# Patient Record
Sex: Female | Born: 1965 | Race: White | Hispanic: No | Marital: Married | Smoking: Never smoker
Health system: Southern US, Community
[De-identification: ages and names within clinical notes are randomized; demographics above are authoritative.]

## PROBLEM LIST (undated history)

## (undated) DIAGNOSIS — S92343A Displaced fracture of fourth metatarsal bone, unspecified foot, initial encounter for closed fracture: Secondary | ICD-10-CM

## (undated) DIAGNOSIS — C801 Malignant (primary) neoplasm, unspecified: Secondary | ICD-10-CM

## (undated) DIAGNOSIS — S92333A Displaced fracture of third metatarsal bone, unspecified foot, initial encounter for closed fracture: Secondary | ICD-10-CM

## (undated) DIAGNOSIS — S92351A Displaced fracture of fifth metatarsal bone, right foot, initial encounter for closed fracture: Secondary | ICD-10-CM

## (undated) DIAGNOSIS — N200 Calculus of kidney: Secondary | ICD-10-CM

## (undated) HISTORY — PX: COLONOSCOPY: SHX174

## (undated) HISTORY — DX: Calculus of kidney: N20.0

## (undated) HISTORY — PX: TONSILLECTOMY AND ADENOIDECTOMY: SUR1326

---

## 1992-11-20 DIAGNOSIS — N200 Calculus of kidney: Secondary | ICD-10-CM

## 1992-11-20 HISTORY — DX: Calculus of kidney: N20.0

## 1998-08-13 ENCOUNTER — Other Ambulatory Visit: Admission: RE | Admit: 1998-08-13 | Discharge: 1998-08-13 | Payer: Self-pay | Admitting: Obstetrics and Gynecology

## 2000-09-10 ENCOUNTER — Other Ambulatory Visit: Admission: RE | Admit: 2000-09-10 | Discharge: 2000-09-10 | Payer: Self-pay | Admitting: *Deleted

## 2000-10-08 ENCOUNTER — Other Ambulatory Visit: Admission: RE | Admit: 2000-10-08 | Discharge: 2000-10-08 | Payer: Self-pay | Admitting: *Deleted

## 2001-10-10 ENCOUNTER — Observation Stay (HOSPITAL_COMMUNITY): Admission: AD | Admit: 2001-10-10 | Discharge: 2001-10-11 | Payer: Self-pay | Admitting: *Deleted

## 2001-10-20 ENCOUNTER — Inpatient Hospital Stay (HOSPITAL_COMMUNITY): Admission: AD | Admit: 2001-10-20 | Discharge: 2001-10-22 | Payer: Self-pay | Admitting: Gynecology

## 2001-12-02 ENCOUNTER — Other Ambulatory Visit: Admission: RE | Admit: 2001-12-02 | Discharge: 2001-12-02 | Payer: Self-pay | Admitting: *Deleted

## 2003-10-09 ENCOUNTER — Other Ambulatory Visit: Admission: RE | Admit: 2003-10-09 | Discharge: 2003-10-09 | Payer: Self-pay | Admitting: Obstetrics and Gynecology

## 2003-11-30 ENCOUNTER — Encounter: Admission: RE | Admit: 2003-11-30 | Discharge: 2003-11-30 | Payer: Self-pay | Admitting: Obstetrics and Gynecology

## 2006-03-30 ENCOUNTER — Other Ambulatory Visit: Admission: RE | Admit: 2006-03-30 | Discharge: 2006-03-30 | Payer: Self-pay | Admitting: Obstetrics and Gynecology

## 2007-04-03 ENCOUNTER — Encounter: Admission: RE | Admit: 2007-04-03 | Discharge: 2007-04-03 | Payer: Self-pay | Admitting: Obstetrics and Gynecology

## 2007-04-09 ENCOUNTER — Encounter: Admission: RE | Admit: 2007-04-09 | Discharge: 2007-04-09 | Payer: Self-pay | Admitting: Obstetrics and Gynecology

## 2007-09-13 ENCOUNTER — Other Ambulatory Visit: Admission: RE | Admit: 2007-09-13 | Discharge: 2007-09-13 | Payer: Self-pay | Admitting: Obstetrics and Gynecology

## 2008-04-09 ENCOUNTER — Encounter: Admission: RE | Admit: 2008-04-09 | Discharge: 2008-04-09 | Payer: Self-pay | Admitting: Obstetrics and Gynecology

## 2009-04-12 ENCOUNTER — Encounter: Admission: RE | Admit: 2009-04-12 | Discharge: 2009-04-12 | Payer: Self-pay | Admitting: Obstetrics and Gynecology

## 2009-11-17 LAB — HM PAP SMEAR: HM Pap smear: NEGATIVE

## 2010-04-13 ENCOUNTER — Encounter: Admission: RE | Admit: 2010-04-13 | Discharge: 2010-04-13 | Payer: Self-pay | Admitting: Obstetrics and Gynecology

## 2011-02-07 ENCOUNTER — Other Ambulatory Visit: Payer: Self-pay | Admitting: Obstetrics and Gynecology

## 2011-02-07 DIAGNOSIS — Z1231 Encounter for screening mammogram for malignant neoplasm of breast: Secondary | ICD-10-CM

## 2011-04-07 NOTE — H&P (Signed)
[facility] of [facility]  Patient:    Patricia Hudson, Patricia Hudson Visit Number: [id number] MRN: [id number]          Service Type: OBS Location: 910B 9161 01 Attending Physician:  Tonye Royalty Dictated by:   Gaetano Hawthorne. Lily Peer, M.D. Admit Date:  10/20/2001                           History and Physical  CHIEF COMPLAINT:              Contractions.  HISTORY OF PRESENT ILLNESS:   The patient is a 45 year old, gravida 1, para 0, with an estimated date of confinement of October 15, 2001, currently 39-5/7 weeks into her pregnancy, who presented to [facility] this afternoon complaining of contractions that started early this morning and intensified at approximately 1300 hours today.  She denied any rupture of membrane.  The patient had been admitted to [facility] approximately ten days ago for induction secondary to favorable cervix, had Cervidil placed, but did not progress in her cervix past 1 cm, 30% effaced, and -3 station and was released home.  Now on todays examination, she was found to be 4+ cm, 90% effaced, -3 station, intact membrane, underwent artificial rupture of membrane with clear amniotic fluid, and she was having spontaneous contractions every four to six minutes apart with a reassuring fetal heart rate tracing.  Vital signs were stable and the patient was afebrile.  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  She has had tonsillectomy and adenoidectomy.  PRENATAL COURSE:              Her prenatal course has been essentially unremarkable, with the exception that since she was Rh negative, she received RhoGAM was [redacted] weeks gestation.  REVIEW OF SYSTEMS:            See hospital form.  PHYSICAL EXAMINATION:  VITAL SIGNS:                  Blood pressure 130/87, respirations 20, pulse 88.  In maternity admissions she was afebrile.  HEENT:                         Unremarkable.  NECK:                         Supple.  Trachea midline.  No carotid bruits. No thyromegaly.  LUNGS:                        Clear to auscultation, without rhonchi or wheezes.  HEART:                        Regular rate and rhythm, no murmurs or gallops.  BREASTS:                      Examination not done.  ABDOMEN:                      Gravid uterus, vertex presentation by Leopolds maneuver, positive felt heart tones.  PELVIC EXAM:                  Cervix 4+ cm, 90% effaced, -3 station.  Clear amniotic fluid  upon artificial rupture of membrane.  EXTREMITIES:                  DTRs 1+.  Negative clonus.  No edema.  PRENATAL LABORATORY DATA:     O negative blood type, negative antibody screen, VDRL was nonreactive, rubella immune, hepatitis B surface antigen and HIV were negative.  Pap smear was normal.  Alpha-fetoprotein was normal.  Blood sugar was normal.  Group B Strep culture was negative.  ASSESSMENT:                   45 year old, gravida 1, para 0, at 40-5/[redacted] weeks gestation, with advanced cervical dilatation and in labor.  The patients cervix was 4+ centimeters, 90% effaced, -3 station, and she underwent artificial rupture of membrane with clear amniotic fluid.  Fetal scalp electrode and IUPC were placed and reassuring fetal heart rate tracing. Contractions every five to six minutes apart.  Vital signs are stable and afebrile.  The patient will undergo epidural placement and we will augment with Pitocin per protocol.  Of note, GBS culture was negative.  PLAN:                         Anticipate vaginal delivery. Dictated by:   Gaetano Hawthorne Lily Peer, M.D. Attending Physician:  Tonye Royalty DD:  10/20/01 TD:  10/20/01 Job: [id number]

## 2011-04-07 NOTE — H&P (Signed)
[facility] of [facility]  Patient:    Patricia Hudson, Patricia Hudson Visit Number: [id number] MRN: [id number]          Service Type: Attending:  Katy Fitch, M.D. Dictated by:   Katy Fitch, M.D.                           History and Physical  CHIEF COMPLAINT:              Term intrauterine pregnancy.  HISTORY OF PRESENT ILLNESS:   The patient is a 45 year old, G1 at 73 1/2 weeks who will be admitted for elective induction secondary to favorable cervix on October 10, 2001. The patient has had no prenatal complications and all of her prenatal labs have all been within normal limits, including rubella, one-hour glucose, and negative GBS.  PAST MEDICAL HISTORY:         Negative.  PAST SURGICAL HISTORY:        Tonsils.  PAST GYNECOLOGIC HISTORY:     Without any abnormal Paps or STDs.  MEDICATIONS:                  Prenatal vitamins.  ALLERGIES:                    None.  SOCIAL HISTORY:               Without any tobacco, alcohol, or drugs.  FAMILY HISTORY:               Without any mental retardation or epithelial cancers.  PRENATAL LABORATORY DATA:     O negative, GBS negative, rubella immune.  PHYSICAL EXAMINATION:  VITAL SIGNS:                  Blood pressure 130/62.  HEENT:                        Throat clear.  LUNGS:                        Clear to auscultation bilaterally.  HEART:                        Regular rate and rhythm.  ABDOMEN:                      Gravid, nontender. Estimated fetal weight 7 pounds 2 ounces.  PELVIC:                       Cervix 1 1/2 to 2, 50% -2.  PLAN:                         The patient will be admitted for induction on November 21 with Cervidil and then high-dose Pitocin started in the a.m. The patient will have no penicillin for negative GBS. Dictated by:   Katy Fitch, M.D. Attending:  Katy Fitch, M.D. DD:  10/09/01 TD:  10/09/01 Job: [id number]

## 2011-04-07 NOTE — Discharge Summary (Signed)
[facility] of [facility]  Patient:    Patricia Hudson, Patricia Hudson Visit Number: [id number] MRN: [id number]          Service Type: OBS Location: 910A 9145 01 Attending Physician:  Tonye Royalty Dictated by:   Antony Contras, [facility] Admit Date:  10/20/2001 Discharge Date: 10/22/2001                             Discharge Summary  DISCHARGE DIAGNOSES:          1. Intrauterine pregnancy at term.                               2. Spontaneous onset of labor.  PROCEDURES:  Normal spontaneous vaginal delivery of viable infant over midline episiotomy with third degree extension.  HISTORY OF PRESENT ILLNESS:   The patient is a 45 year old primigravida with an LMP of January 08, 2001, [facility] October 15, 2001.  Prenatal course was uncomplicated.  LABORATORY/ACCESSORY DATA:    Blood type O-negative.  Antibody screen negative.  RPR, HBsAg, HIV nonreactive.  HOSPITAL COURSE/TREATMENT:    The patient was admitted on October 20, 2001 with spontaneous onset of labor.  She was 4 cm/100%/-3 station.  She underwent artificial rupture of membranes, clear amniotic fluid.  GBS was negative.  She progressed to complete dilatation and delivered an Apgar 8/8 female infant, weighed 7 pounds 12 ounces over a midline episiotomy with third degree extension.  POSTPARTUM COURSE:            She remained afebrile, had no difficulty voiding and was able to be discharged on her second postpartum day in satisfactory condition.  She did receive Rhogam since baby was O-positive.  CBC: Hematocrit 30, hemoglobin 10.8, WBC 19.7, platelets 164,000.  DISPOSITION:                  Follow up in six weeks.  Continue prenatal vitamins and iron.  Motrin, Tylox for pain. Dictated by:   Antony Contras, [facility] Attending Physician:  Tonye Royalty DD:  11/29/01 TD:  11/30/01 Job: [id number]

## 2011-04-18 ENCOUNTER — Ambulatory Visit
Admission: RE | Admit: 2011-04-18 | Discharge: 2011-04-18 | Disposition: A | Discharge status: Home / Self Care | Payer: PRIVATE HEALTH INSURANCE | Source: Ambulatory Visit | Attending: Obstetrics and Gynecology | Admitting: Obstetrics and Gynecology

## 2011-04-18 DIAGNOSIS — Z1231 Encounter for screening mammogram for malignant neoplasm of breast: Secondary | ICD-10-CM

## 2012-03-27 ENCOUNTER — Other Ambulatory Visit: Payer: Self-pay | Admitting: Obstetrics and Gynecology

## 2012-03-27 DIAGNOSIS — Z1231 Encounter for screening mammogram for malignant neoplasm of breast: Secondary | ICD-10-CM

## 2012-05-08 ENCOUNTER — Ambulatory Visit
Admission: RE | Admit: 2012-05-08 | Discharge: 2012-05-08 | Disposition: A | Discharge status: Home / Self Care | Payer: PRIVATE HEALTH INSURANCE | Source: Ambulatory Visit | Attending: Obstetrics and Gynecology | Admitting: Obstetrics and Gynecology

## 2012-05-08 DIAGNOSIS — Z1231 Encounter for screening mammogram for malignant neoplasm of breast: Secondary | ICD-10-CM

## 2012-05-08 LAB — HM MAMMOGRAPHY: HM Mammogram: NEGATIVE

## 2013-03-24 ENCOUNTER — Encounter: Payer: Self-pay | Admitting: *Deleted

## 2013-03-25 ENCOUNTER — Ambulatory Visit: Payer: Self-pay | Admitting: Obstetrics and Gynecology

## 2013-04-17 ENCOUNTER — Other Ambulatory Visit: Payer: Self-pay

## 2013-04-17 DIAGNOSIS — Z1231 Encounter for screening mammogram for malignant neoplasm of breast: Secondary | ICD-10-CM

## 2013-04-18 ENCOUNTER — Encounter: Payer: Self-pay | Admitting: Obstetrics and Gynecology

## 2013-04-18 ENCOUNTER — Ambulatory Visit (INDEPENDENT_AMBULATORY_CARE_PROVIDER_SITE_OTHER): Payer: BC Managed Care – PPO | Admitting: Obstetrics and Gynecology

## 2013-04-18 VITALS — BP 100/70 | Ht 67.5 in | Wt 140.0 lb

## 2013-04-18 DIAGNOSIS — N920 Excessive and frequent menstruation with regular cycle: Secondary | ICD-10-CM

## 2013-04-18 DIAGNOSIS — Z0000 Encounter for general adult medical examination without abnormal findings: Secondary | ICD-10-CM

## 2013-04-18 DIAGNOSIS — Z01419 Encounter for gynecological examination (general) (routine) without abnormal findings: Secondary | ICD-10-CM

## 2013-04-18 LAB — POCT URINALYSIS DIPSTICK
Glucose, UA: NEGATIVE
Nitrite, UA: NEGATIVE
Protein, UA: NEGATIVE
Urobilinogen, UA: NEGATIVE
pH, UA: 5

## 2013-04-18 NOTE — Patient Instructions (Signed)

## 2013-04-18 NOTE — Progress Notes (Signed)
Patient ID: Patricia Hudson, female   DOB: [birth year], 47 y.o.   MRN: [id number] 47 y.o. G1P1001 MarriedCaucasianF here for annual exam. Menses irregular, and when they come they are long, like a month long easily, and fairly heavy.  Ongoing for over a year.  This is year 58 of her Paragard.     Patient's last menstrual period was 03/04/2013.          Sexually active: yes  The current method of family planning is IUD.    Exercising: no  The patient does not participate in regular exercise at present. Smoker:  no  Health Maintenance: Pap:  11/18/09 History of abnormal Pap:  no MMG:  05/08/12 Colonoscopy:  never BMD:   never TDaP:  2008 Screening Labs: years ago, Hb today: 13.5, Urine today: trace blood   reports that she has never smoked. She has never used smokeless tobacco. She reports that  drinks alcohol. She reports that she does not use illicit drugs.  History reviewed. No pertinent past medical history.  Past Surgical History  Procedure Laterality Date  . Tonsillectomy and adenoidectomy  1970's    Current Outpatient Prescriptions  Medication Sig Dispense Refill  . PARAGARD INTRAUTERINE COPPER IU by Intrauterine route once.      . Acetaminophen (TYLENOL PO) Take by mouth as needed.      . IBUPROFEN PO Take by mouth as needed (chewable).       No current facility-administered medications for this visit.    Family History  Problem Relation Age of Onset  . Diabetes Father   . Hypertension Father   . Lung cancer Father   . Breast cancer Maternal Grandmother 60  . Breast cancer Mother 75  . Breast cancer Paternal Aunt 70    ROS:  Pertinent items are noted in HPI.  Otherwise, a comprehensive ROS was negative.  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  One son,Greyson age 61.  Husband Viviann Spare is a family practice physician here in [place].  Pt's father dx'd with small lung ca this year, tx'd with RT.    Exam:   BP 100/70  Ht 5' 7.5" (1.715  m)  Wt 140 lb (63.504 kg)  BMI 21.59 kg/m2  LMP 03/04/2013  Height: 5' 7.5" (171.5 cm) Ht stable, weight down 1 pound.  Ht Readings from Last 3 Encounters:  04/18/13 5' 7.5" (1.715 m)    General appearance: alert, cooperative and appears stated age Head: Normocephalic, without obvious abnormality, atraumatic Neck: no adenopathy, supple, symmetrical, trachea midline and thyroid normal to inspection and palpation Lungs: clear to auscultation bilaterally Breasts: normal appearance, no masses or tenderness Heart: regular rate and rhythm Abdomen: soft, non-tender; bowel sounds normal; no masses,  no organomegaly Extremities: extremities normal, atraumatic, no cyanosis or edema Skin: Skin color, texture, turgor normal. No rashes or lesions Lymph nodes: Cervical, supraclavicular, and axillary nodes normal. No abnormal inguinal nodes palpated Neurologic: Grossly normal   Pelvic: External genitalia:  no lesions              Urethra:  normal appearing urethra with no masses, tenderness or lesions              Bartholins and Skenes: normal                 Vagina: normal appearing vagina with normal color and discharge, no lesions              Cervix: no  lesions              Pap taken: yes Bimanual Exam:  Uterus:  normal size, contour, position, consistency, mobility, non-tender              Adnexa: no mass, fullness, tenderness               Rectovaginal: Confirms               Anus:  normal sphincter tone, no lesions  A:  Well Woman with normal exam, Paraguard due out 01/12/2014             Menorrhagia for about a year  P:   mammogram pap smear counseled on breast self exam, mammography screening, adequate intake of calcium and vitamin D, diet and exercise return annually or prn PUS/SHSG/poss bx  An After Visit Summary was printed and given to the patient.

## 2013-05-05 ENCOUNTER — Telehealth: Payer: Self-pay | Admitting: *Deleted

## 2013-05-05 NOTE — Telephone Encounter (Signed)
Call to patient to sched PUS/SHGM/poss endo biopsy.  Patient with IUD in place. Instructed to take Motrin 800 mg 1 hour prior to procedure with food. ( with IUD in place, anything else I need to do?)

## 2013-05-05 NOTE — Telephone Encounter (Signed)
No.  Nothing special.

## 2013-05-07 NOTE — Telephone Encounter (Signed)
LMTCB to discuss insurance benefits for pus-shg/endo bx. Patient already scheduled for 05/28/13

## 2013-05-15 NOTE — Telephone Encounter (Signed)
LMTCB to discuss insurance benefits for upcoming appointment.

## 2013-05-19 ENCOUNTER — Telehealth: Payer: Self-pay | Admitting: Obstetrics and Gynecology

## 2013-05-19 NOTE — Telephone Encounter (Signed)
LVM advising patient to keep her shgm/endo bx appointment per Dr. Tresa Res.

## 2013-05-22 NOTE — Telephone Encounter (Signed)
Patient returned call to me. I spoke with the patient about her upcoming pus-shgm/poss endo bx. I informed her that Dr. Tresa Res wants her to have it completed since this issue has been ongoing for over a year and they need to determine the cause of the excessive bleeding.The patient decided she wanted to cancel her appointment and will call back to reschedule if she experiences any other problems.  Routed to [place].

## 2013-05-27 ENCOUNTER — Ambulatory Visit
Admission: RE | Admit: 2013-05-27 | Discharge: 2013-05-27 | Disposition: A | Discharge status: Home / Self Care | Payer: BC Managed Care – PPO | Source: Ambulatory Visit

## 2013-05-27 DIAGNOSIS — Z1231 Encounter for screening mammogram for malignant neoplasm of breast: Secondary | ICD-10-CM

## 2013-05-28 ENCOUNTER — Other Ambulatory Visit: Payer: Self-pay | Admitting: Obstetrics and Gynecology

## 2013-05-28 ENCOUNTER — Other Ambulatory Visit: Payer: Self-pay

## 2013-06-25 NOTE — Telephone Encounter (Signed)
See letter from Dr Tresa Res.

## 2013-07-08 ENCOUNTER — Telehealth: Payer: Self-pay | Admitting: *Deleted

## 2013-07-08 NOTE — Telephone Encounter (Signed)
No further follow up per Dr Tresa Res. If patient should call, need to address any needs with a physician.

## 2013-07-08 NOTE — Telephone Encounter (Signed)
Message copied by Alisa Graff on Tue Jul 08, 2013 12:27 PM ------      Message from: Meredeth Ide P      Created: Tue Jul 08, 2013 11:22 AM      Regarding: RE: canceled PUS       No, no further f/u needed.  I'm not going to discharge her from the practice because her mother is a long time personal friend of mine.        ----- Message -----         From: Armen Pickup, RN         Sent: 07/07/2013   6:14 PM           To: Nolon Bussing, MD      Subject: canceled PUS                                              On 06-02-13, you mailed this patient a letter urging her to have her PUS.  It does not indicate discharge after 30 days.  She has not called.  Any further follow up?       ------

## 2014-07-07 ENCOUNTER — Other Ambulatory Visit: Payer: Self-pay

## 2014-07-07 DIAGNOSIS — Z1231 Encounter for screening mammogram for malignant neoplasm of breast: Secondary | ICD-10-CM

## 2014-07-16 ENCOUNTER — Ambulatory Visit: Payer: BC Managed Care – PPO

## 2014-07-28 ENCOUNTER — Ambulatory Visit
Admission: RE | Admit: 2014-07-28 | Discharge: 2014-07-28 | Disposition: A | Discharge status: Home / Self Care | Payer: BC Managed Care – PPO | Source: Ambulatory Visit

## 2014-07-28 DIAGNOSIS — Z1231 Encounter for screening mammogram for malignant neoplasm of breast: Secondary | ICD-10-CM

## 2014-09-17 ENCOUNTER — Encounter: Payer: Self-pay | Admitting: Nurse Practitioner

## 2014-09-17 ENCOUNTER — Ambulatory Visit (INDEPENDENT_AMBULATORY_CARE_PROVIDER_SITE_OTHER): Payer: BC Managed Care – PPO | Admitting: Nurse Practitioner

## 2014-09-17 VITALS — BP 116/74 | HR 72 | Ht 67.5 in | Wt 137.0 lb

## 2014-09-17 DIAGNOSIS — Z01419 Encounter for gynecological examination (general) (routine) without abnormal findings: Secondary | ICD-10-CM

## 2014-09-17 DIAGNOSIS — R319 Hematuria, unspecified: Secondary | ICD-10-CM

## 2014-09-17 DIAGNOSIS — N912 Amenorrhea, unspecified: Secondary | ICD-10-CM

## 2014-09-17 DIAGNOSIS — Z0000 Encounter for general adult medical examination without abnormal findings: Secondary | ICD-10-CM

## 2014-09-17 DIAGNOSIS — Z975 Presence of (intrauterine) contraceptive device: Secondary | ICD-10-CM

## 2014-09-17 LAB — LIPID PANEL
Cholesterol: 227 mg/dL — ABNORMAL HIGH (ref 0–200)
HDL: 70 mg/dL (ref >39)
LDL Cholesterol: 146 mg/dL — ABNORMAL HIGH (ref 0–99)
TRIGLYCERIDES: 53 mg/dL (ref <150)
Total CHOL/HDL Ratio: 3.2 Ratio
VLDL: 11 mg/dL (ref 0–40)

## 2014-09-17 LAB — COMPREHENSIVE METABOLIC PANEL
ALBUMIN: 4.9 g/dL (ref 3.5–5.2)
ALT: 10 U/L (ref 0–35)
AST: 19 U/L (ref 0–37)
Alkaline Phosphatase: 73 U/L (ref 39–117)
BUN: 11 mg/dL (ref 6–23)
CALCIUM: 10 mg/dL (ref 8.4–10.5)
CHLORIDE: 105 meq/L (ref 96–112)
CO2: 25 mEq/L (ref 19–32)
Creat: 0.92 mg/dL (ref 0.50–1.10)
Glucose, Bld: 91 mg/dL (ref 70–99)
POTASSIUM: 5.5 meq/L — AB (ref 3.5–5.3)
SODIUM: 140 meq/L (ref 135–145)
TOTAL PROTEIN: 7.1 g/dL (ref 6.0–8.3)
Total Bilirubin: 0.6 mg/dL (ref 0.2–1.2)

## 2014-09-17 LAB — HEMOGLOBIN, FINGERSTICK: HEMOGLOBIN, FINGERSTICK: 14 g/dL (ref 12.0–16.0)

## 2014-09-17 LAB — TSH: TSH: 0.79 u[IU]/mL (ref 0.350–4.500)

## 2014-09-17 LAB — POCT URINALYSIS DIPSTICK
Bilirubin, UA: NEGATIVE
Glucose, UA: NEGATIVE
Ketones, UA: NEGATIVE
Nitrite, UA: NEGATIVE
PROTEIN UA: NEGATIVE
UROBILINOGEN UA: NEGATIVE
pH, UA: 6

## 2014-09-17 NOTE — Patient Instructions (Signed)

## 2014-09-17 NOTE — Progress Notes (Signed)
Patient ID: Patricia Hudson, female   DOB: 01-25-1966, 48 y.o.   MRN: [id number] 48 y.o. G41P1001 Married Caucasian Fe here for annual exam.  History of irregular menses on Paraguard IUD.  Last year was having a long cycle - almost a month last summer.  When she was seen for AEX.  Dr. Lenda Kelp was scheduling her for PUS.  Because the bleeding stopped the patient decided not to have done.  A letter by Dr. Joan Flores was sent encouraging her to complete the evaluation and she decided against this.(  Long time personal friends of Dr. Lenda Kelp).  She has had no vaginal bleeding since then.  Some increase in vaso symptoms that were mild but they too are better.  Paraguard IUD should have come out in February and is still in.  She is willing to have it removed.  Not using anything for birth control at this time as she and husband are not SA.  Patient's last menstrual period was 04/20/2013.          Sexually active: Yes.    The current method of family planning is IUD, Paraguard.  Inserted 01/13/04  Exercising: No.  The patient does not participate in regular exercise at present. Smoker:  no  Health Maintenance: Pap:  04/18/13, WNL, neg HR HPV MMG:  07/28/14, 3D, Bi-Rads 1:  Negative  TDaP:  2008 Labs:  HB:  14.0   Urine:  Trace RBC, trace leuk's   reports that she has never smoked. She has never used smokeless tobacco. She reports that she drinks alcohol. She reports that she does not use illicit drugs.  Past Medical History  Diagnosis Date  . Renal calculi 1994    Past Surgical History  Procedure Laterality Date  . Tonsillectomy and adenoidectomy  1970's    Current Outpatient Prescriptions  Medication Sig Dispense Refill  . Acetaminophen (TYLENOL PO) Take by mouth as needed.      . IBUPROFEN PO Take by mouth as needed (chewable).      Marland Kitchen PARAGARD INTRAUTERINE COPPER IU by Intrauterine route once.       No current facility-administered medications for this visit.    Family History  Problem Relation Age of  Onset  . Diabetes Father   . Hypertension Father   . Lung cancer Father   . Breast cancer Maternal Grandmother 60    double mastectomy  . Breast cancer Mother 53    tripple negaative.  BRCA Neg  . Breast cancer Paternal Aunt 4    ROS:  Pertinent items are noted in HPI.  Otherwise, a comprehensive ROS was negative.  Exam:   BP 116/74  Pulse 72  Ht 5' 7.5" (1.715 m)  Wt 137 lb (62.143 kg)  BMI 21.13 kg/m2  LMP 04/20/2013 Height: 5' 7.5" (171.5 cm)  Ht Readings from Last 3 Encounters:  09/17/14 5' 7.5" (1.715 m)  04/18/13 5' 7.5" (1.715 m)    General appearance: alert, cooperative and appears stated age Head: Normocephalic, without obvious abnormality, atraumatic Neck: no adenopathy, supple, symmetrical, trachea midline and thyroid normal to inspection and palpation Lungs: clear to auscultation bilaterally Breasts: normal appearance, no masses or tenderness Heart: regular rate and rhythm Abdomen: soft, non-tender; no masses,  no organomegaly Extremities: extremities normal, atraumatic, no cyanosis or edema Skin: Skin color, texture, turgor normal. No rashes or lesions Lymph nodes: Cervical, supraclavicular, and axillary nodes normal. No abnormal inguinal nodes palpated Neurologic: Grossly normal   Pelvic: External genitalia:  no lesions  Urethra:  normal appearing urethra with no masses, tenderness or lesions              Bartholin's and Skene's: normal                 Vagina: normal appearing vagina with normal color and discharge, no lesions              Cervix: anteverted, IUD strings are visible - very light brown staining              Pap taken: No. Bimanual Exam:  Uterus:  normal size, contour, position, consistency, mobility, non-tender              Adnexa: no mass, fullness, tenderness               Rectovaginal: Confirms               Anus:  normal sphincter tone, no lesions  A:  Well Woman with normal exam  Contraception with Paraguard IUD  insertion 01/13/04  Current amenorrhea with mild vaso symptoms that are tolerable  R/O UTI - no symptoms  P:   Reviewed health and wellness pertinent to exam  Pap smear not taken today  Mammogram due 9/16  Will put an order in for removal of IUD  Routine labs including Teague, and urine C&S  While here if needs to have further evaluation if [facility] is low can discuss counseled on breast self exam, mammography screening, adequate intake of calcium and vitamin D, diet and exercise return annually or prn  An After Visit Summary was printed and given to the patient.

## 2014-09-18 LAB — VITAMIN D 25 HYDROXY (VIT D DEFICIENCY, FRACTURES): Vit D, 25-Hydroxy: 35 ng/mL (ref 30–89)

## 2014-09-18 LAB — URINALYSIS, MICROSCOPIC ONLY
BACTERIA UA: NONE SEEN
CRYSTALS: NONE SEEN
Casts: NONE SEEN
Squamous Epithelial / LPF: NONE SEEN

## 2014-09-18 LAB — FOLLICLE STIMULATING HORMONE: FSH: 113.7 m[IU]/mL

## 2014-09-18 NOTE — Progress Notes (Signed)
Encounter reviewed by Dr. Leith Hedlund Silva.  

## 2014-09-19 LAB — URINE CULTURE
Colony Count: NO GROWTH
Organism ID, Bacteria: NO GROWTH

## 2014-09-21 ENCOUNTER — Telehealth: Payer: Self-pay | Admitting: Nurse Practitioner

## 2014-09-21 ENCOUNTER — Other Ambulatory Visit: Payer: Self-pay | Admitting: Nurse Practitioner

## 2014-09-21 ENCOUNTER — Encounter: Payer: Self-pay | Admitting: Nurse Practitioner

## 2014-09-21 DIAGNOSIS — R899 Unspecified abnormal finding in specimens from other organs, systems and tissues: Secondary | ICD-10-CM

## 2014-09-21 NOTE — Telephone Encounter (Signed)
Spoke with patient. Advised that per benefit quote received, her iud removal will be covered at 100% of allowable. There will be 0 patient responsibility. Patient agreeable.

## 2014-09-22 ENCOUNTER — Other Ambulatory Visit: Payer: BC Managed Care – PPO

## 2014-09-22 DIAGNOSIS — R899 Unspecified abnormal finding in specimens from other organs, systems and tissues: Secondary | ICD-10-CM

## 2014-09-22 LAB — POTASSIUM: Potassium: 5 mEq/L (ref 3.5–5.3)

## 2015-08-27 ENCOUNTER — Other Ambulatory Visit: Payer: Self-pay

## 2015-08-27 DIAGNOSIS — Z1231 Encounter for screening mammogram for malignant neoplasm of breast: Secondary | ICD-10-CM

## 2015-09-03 ENCOUNTER — Ambulatory Visit
Admission: RE | Admit: 2015-09-03 | Discharge: 2015-09-03 | Disposition: A | Discharge status: Home / Self Care | Payer: BLUE CROSS/BLUE SHIELD | Source: Ambulatory Visit

## 2015-09-03 DIAGNOSIS — Z1231 Encounter for screening mammogram for malignant neoplasm of breast: Secondary | ICD-10-CM

## 2016-05-30 ENCOUNTER — Ambulatory Visit (INDEPENDENT_AMBULATORY_CARE_PROVIDER_SITE_OTHER): Payer: PRIVATE HEALTH INSURANCE | Admitting: Nurse Practitioner

## 2016-05-30 ENCOUNTER — Ambulatory Visit (INDEPENDENT_AMBULATORY_CARE_PROVIDER_SITE_OTHER): Payer: PRIVATE HEALTH INSURANCE | Admitting: Obstetrics & Gynecology

## 2016-05-30 ENCOUNTER — Encounter: Payer: Self-pay | Admitting: Nurse Practitioner

## 2016-05-30 VITALS — BP 100/66 | HR 64 | Ht 67.5 in | Wt 142.0 lb

## 2016-05-30 DIAGNOSIS — Z975 Presence of (intrauterine) contraceptive device: Secondary | ICD-10-CM

## 2016-05-30 DIAGNOSIS — Z01419 Encounter for gynecological examination (general) (routine) without abnormal findings: Secondary | ICD-10-CM | POA: Diagnosis not present

## 2016-05-30 DIAGNOSIS — Z30432 Encounter for removal of intrauterine contraceptive device: Secondary | ICD-10-CM | POA: Diagnosis not present

## 2016-05-30 DIAGNOSIS — Z0000 Encounter for general adult medical examination without abnormal findings: Secondary | ICD-10-CM

## 2016-05-30 NOTE — Progress Notes (Signed)
Patient ID: Patricia Hudson, female   DOB: 1966/06/30, 50 y.o.   MRN: [id number]  50 y.o. G57P1001 Married  Caucasian Fe here for annual exam.  No menses since 04/2013.  FSH was 113.7.   No vaso symptoms. She still has Paraguard in place and is 2 yrs past due being removed.  She feels that she will 'leave in place' since not hurting her.   She remembers how painful it was to insert and is not wanting a removal unless she has to.  Patient's last menstrual period was 04/20/2013.          Sexually active: Yes.    The current method of family planning is IUD. Paragard inserted 01/13/04. Exercising: Yes.    The patient does not participate in regular exercise at present. Smoker:  no  Health Maintenance: Pap:04/18/13, Negative with neg HR HPV MMG:09/03/15, 3D, Bi-Rads 1: Negative  TDaP: 2008 HIV: 2002 with pregnancy Labs: declined   reports that she has never smoked. She has never used smokeless tobacco. She reports that she drinks alcohol. She reports that she does not use illicit drugs.  Past Medical History  Diagnosis Date  . Renal calculi 1994    Past Surgical History  Procedure Laterality Date  . Tonsillectomy and adenoidectomy  1970's    Current Outpatient Prescriptions  Medication Sig Dispense Refill  . Acetaminophen (TYLENOL PO) Take by mouth as needed.    . IBUPROFEN PO Take by mouth as needed (chewable).    Marland Kitchen PARAGARD INTRAUTERINE COPPER IU by Intrauterine route once.     No current facility-administered medications for this visit.    Family History  Problem Relation Age of Onset  . Diabetes Father   . Hypertension Father   . Lung cancer Father 70  . Breast cancer Maternal Grandmother 60    double mastectomy  . Breast cancer Mother 65    tripple negaative.  BRCA Neg  . Breast cancer Paternal Aunt 63    ROS:  Pertinent items are noted in HPI.  Otherwise, a comprehensive ROS was negative.  Exam:   BP 100/66 mmHg  Pulse 64  Ht 5' 7.5" (1.715 m)  Wt 142 lb  (64.411 kg)  BMI 21.90 kg/m2  LMP 04/20/2013 Height: 5' 7.5" (171.5 cm) Ht Readings from Last 3 Encounters:  05/30/16 5' 7.5" (1.715 m)  09/17/14 5' 7.5" (1.715 m)  04/18/13 5' 7.5" (1.715 m)    General appearance: alert, cooperative and appears stated age Head: Normocephalic, without obvious abnormality, atraumatic Neck: no adenopathy, supple, symmetrical, trachea midline and thyroid normal to inspection and palpation Lungs: clear to auscultation bilaterally Breasts: normal appearance, no masses or tenderness Heart: regular rate and rhythm Abdomen: soft, non-tender; no masses,  no organomegaly Extremities: extremities normal, atraumatic, no cyanosis or edema Skin: Skin color, texture, turgor normal. No rashes or lesions Lymph nodes: Cervical, supraclavicular, and axillary nodes normal. No abnormal inguinal nodes palpated Neurologic: Grossly normal   Pelvic: External genitalia:  no lesions              Urethra:  normal appearing urethra with no masses, tenderness or lesions              Bartholin's and Skene's: normal                 Vagina: normal appearing vagina with normal color and discharge, no lesions              Cervix: anteverted  IUD strings are easily  seen and should not be a problems with removal.              Pap taken: No. Bimanual Exam:  Uterus:  normal size, contour, position, consistency, mobility, non-tender              Adnexa: no mass, fullness, tenderness               Rectovaginal: Confirms               Anus:  normal sphincter tone, no lesions  Chaperone present: yes  A:  Well Woman with normal exam  Paraguard IUD insertion 01/13/04 Post menopausal  P:   Reviewed health and wellness pertinent to exam  Pap smear as above  Mammogram is due 08/2016  Will have Dr. Miller see pt and remove IUD while she is here if possible  Counseled on breast self exam, mammography screening, adequate intake of calcium and vitamin D, diet and  exercise, Kegel's exercises return annually or prn  An After Visit Summary was printed and given to the patient.    

## 2016-05-30 NOTE — Patient Instructions (Signed)

## 2016-06-01 ENCOUNTER — Ambulatory Visit: Payer: PRIVATE HEALTH INSURANCE | Admitting: Obstetrics & Gynecology

## 2016-06-01 LAB — IPS PAP TEST WITH HPV

## 2016-06-02 ENCOUNTER — Encounter: Payer: Self-pay | Admitting: Obstetrics & Gynecology

## 2016-06-02 DIAGNOSIS — Z30432 Encounter for removal of intrauterine contraceptive device: Secondary | ICD-10-CM | POA: Insufficient documentation

## 2016-06-02 NOTE — Progress Notes (Signed)
50 y.o. [id number] Married Caucasian female presents for removal of ParaGard  IUD.  She was here for AEX with Kem Boroughs and she wants removal done today if possible.  She had [place] of 113 obtained and has not cycled since 2014.  Due to this, I am in agreement that she needs no further contraception.    All questions answered prior to start of procedure.    LMP:  Patient's last menstrual period was 04/20/2013.  There are no active problems to display for this patient.  Past Medical History  Diagnosis Date  . Renal calculi 1994   Current Outpatient Prescriptions on File Prior to Visit  Medication Sig Dispense Refill  . Acetaminophen (TYLENOL PO) Take by mouth as needed.    . IBUPROFEN PO Take by mouth as needed (chewable).     No current facility-administered medications on file prior to visit.   Sulfa antibiotics  Review of Systems  All other systems reviewed and are negative.  Gen:  WNWF healthy female NAD Abdomen: soft, non-tender Groin:  no inguinal nodes palpated  Pelvic exam: Vulva:  normal female genitalia Vagina:  normal vagina Cervix:  Non-tender, Negative CMT, no lesions or redness. Uterus:  normal shape, position and consistency   Procedure:  Speculum reinserted.  Cervix visualized and IUD string noted and grasped with ringed forcep.  With one pull, IUD removed easily.  Pt has some mild cramping but tolerated this well.  Speculum removed as procedure was ended.  Pt advised some spotting over the next few days is commonly seen but this is due to removal of the IUD that has been present for a long time and typically not anything else.  If it continues past a week or occurs in the future, she knows to call.   A: Removal of ParGard IUD   P: Return for AEX as scheduled with Kem Boroughs.

## 2016-06-02 NOTE — Progress Notes (Signed)
Reviewed personally.  M. Suzanne Tanajah Boulter, MD.  

## 2016-06-14 ENCOUNTER — Telehealth: Payer: Self-pay | Admitting: Nurse Practitioner

## 2016-06-14 DIAGNOSIS — K921 Melena: Secondary | ICD-10-CM

## 2016-06-14 NOTE — Telephone Encounter (Signed)
Spoke with patient. Patient states that over the last couple of weeks she has noticed a burgundy red color in her stool with her bowel movements. Reports she notices this in her stool and not with wiping. Denies any abdominal discomfort. Denies any changes with her bowel movements. Patient has not seen a GI doctor before. Advised I will speak with Kem Boroughs, FNP and return call. She is agreeable.  Kem Boroughs, FNP, okay to refer to Dr.Mann at this time?

## 2016-06-14 NOTE — Telephone Encounter (Signed)
Yes she needs a GI referral to dr. Collene Mares.

## 2016-06-14 NOTE — Telephone Encounter (Signed)
Spoke with patient. Advised of message as seen below from Patricia Hudson, [place]. She is agreeable and verbalizes understanding. Referral placed to Dr.Mann. Aware she will be contacted directly by Dr.Mann's office or by our referrals coordinator regarding appointment date and and time. She is agreeable.  Cc: Theresia Lo for appointment scheduling  Routing to provider for final review. Patient agreeable to disposition. Will close encounter.

## 2016-06-14 NOTE — Telephone Encounter (Signed)
Patient says she saw blood in her stool.

## 2016-07-07 ENCOUNTER — Ambulatory Visit
Admission: RE | Admit: 2016-07-07 | Discharge: 2016-07-07 | Disposition: A | Discharge status: Home / Self Care | Payer: PRIVATE HEALTH INSURANCE | Source: Ambulatory Visit | Attending: Gastroenterology | Admitting: Gastroenterology

## 2016-07-07 ENCOUNTER — Ambulatory Visit (HOSPITAL_COMMUNITY)
Admission: RE | Admit: 2016-07-07 | Discharge: 2016-07-07 | Disposition: A | Discharge status: Home / Self Care | Payer: No Typology Code available for payment source | Source: Ambulatory Visit | Attending: Gastroenterology | Admitting: Gastroenterology

## 2016-07-07 ENCOUNTER — Other Ambulatory Visit (HOSPITAL_COMMUNITY): Payer: Self-pay | Admitting: Radiology

## 2016-07-07 ENCOUNTER — Other Ambulatory Visit: Payer: Self-pay | Admitting: Gastroenterology

## 2016-07-07 DIAGNOSIS — R933 Abnormal findings on diagnostic imaging of other parts of digestive tract: Secondary | ICD-10-CM

## 2016-07-07 DIAGNOSIS — K769 Liver disease, unspecified: Secondary | ICD-10-CM | POA: Insufficient documentation

## 2016-07-07 DIAGNOSIS — R935 Abnormal findings on diagnostic imaging of other abdominal regions, including retroperitoneum: Secondary | ICD-10-CM | POA: Insufficient documentation

## 2016-07-07 DIAGNOSIS — K6389 Other specified diseases of intestine: Secondary | ICD-10-CM

## 2016-07-07 DIAGNOSIS — C187 Malignant neoplasm of sigmoid colon: Secondary | ICD-10-CM | POA: Insufficient documentation

## 2016-07-07 MED ORDER — IOPAMIDOL (ISOVUE-300) INJECTION 61%
100.0000 mL | Freq: Once | INTRAVENOUS | Status: AC | PRN
  Administered 2016-07-07: 100 mL via INTRAVENOUS

## 2016-07-07 MED ORDER — GADOBENATE DIMEGLUMINE 529 MG/ML IV SOLN
15.0000 mL | Freq: Once | INTRAVENOUS | Status: AC | PRN
  Administered 2016-07-07: 13 mL via INTRAVENOUS

## 2016-07-08 ENCOUNTER — Other Ambulatory Visit: Payer: Self-pay | Admitting: Gastroenterology

## 2016-07-08 DIAGNOSIS — R935 Abnormal findings on diagnostic imaging of other abdominal regions, including retroperitoneum: Secondary | ICD-10-CM

## 2016-07-10 ENCOUNTER — Other Ambulatory Visit: Payer: Self-pay | Admitting: Gastroenterology

## 2016-07-10 ENCOUNTER — Ambulatory Visit
Admission: RE | Admit: 2016-07-10 | Discharge: 2016-07-10 | Disposition: A | Discharge status: Home / Self Care | Payer: PRIVATE HEALTH INSURANCE | Source: Ambulatory Visit | Attending: Gastroenterology | Admitting: Gastroenterology

## 2016-07-10 ENCOUNTER — Encounter: Payer: Self-pay | Admitting: Nurse Practitioner

## 2016-07-10 ENCOUNTER — Telehealth: Payer: Self-pay | Admitting: *Deleted

## 2016-07-10 DIAGNOSIS — K6389 Other specified diseases of intestine: Secondary | ICD-10-CM

## 2016-07-10 DIAGNOSIS — R935 Abnormal findings on diagnostic imaging of other abdominal regions, including retroperitoneum: Secondary | ICD-10-CM

## 2016-07-10 DIAGNOSIS — K769 Liver disease, unspecified: Secondary | ICD-10-CM

## 2016-07-10 MED ORDER — IOPAMIDOL (ISOVUE-300) INJECTION 61%
75.0000 mL | Freq: Once | INTRAVENOUS | Status: AC | PRN
  Administered 2016-07-10: 75 mL via INTRAVENOUS

## 2016-07-10 NOTE — Telephone Encounter (Signed)
Oncology Nurse Navigator Documentation  Oncology Nurse Navigator Flowsheets 07/10/2016  Navigator Location [facility]  Navigator Encounter Type Introductory phone call  Abnormal Finding Date 07/07/2016  Confirmed Diagnosis Date 07/07/2016  Spoke with patient and provided new patient appointment for 07/13/16 at 2:45/3:00 pm with Dr. Benay Spice. Informed of location of [place], valet service, and registration process. Reminded to bring photo ID, insurance cards and a current medication list, including supplements. Patient verbalizes understanding. Notified HIM to enter appointment into EPIC.

## 2016-07-12 ENCOUNTER — Telehealth: Payer: Self-pay | Admitting: Nurse Practitioner

## 2016-07-12 ENCOUNTER — Encounter: Payer: Self-pay | Admitting: *Deleted

## 2016-07-12 NOTE — Telephone Encounter (Signed)
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  She has been in close contact with Patricia Hudson about her results of CT scan of the abdomen, chest, and MRI of abdomen.  She has apt tomorrow with Patricia Hudson and Oncology group.  She has apt with Patricia Hudson as well.   She has a pretty good grip on what to expect from procedures, resection and chemotherapy from her husband Patricia Hudson.  She again stated if I had not asked the question of blood in stools she would not have gone back and really investigated the color or character of stool changes.  She is very thankful that we made arrangements for her to be seen with Patricia Hudson.  She is given support.

## 2016-07-13 ENCOUNTER — Encounter: Payer: Self-pay | Admitting: *Deleted

## 2016-07-13 ENCOUNTER — Inpatient Hospital Stay (HOSPITAL_COMMUNITY)
Admission: EM | Admit: 2016-07-13 | Discharge: 2016-07-15 | DRG: 919 | Disposition: A | Discharge status: Home / Self Care | Payer: No Typology Code available for payment source | Attending: Gastroenterology | Admitting: Gastroenterology

## 2016-07-13 ENCOUNTER — Ambulatory Visit (HOSPITAL_BASED_OUTPATIENT_CLINIC_OR_DEPARTMENT_OTHER): Payer: PRIVATE HEALTH INSURANCE | Admitting: Oncology

## 2016-07-13 ENCOUNTER — Ambulatory Visit: Payer: Self-pay | Admitting: General Surgery

## 2016-07-13 ENCOUNTER — Other Ambulatory Visit (HOSPITAL_COMMUNITY): Payer: Self-pay

## 2016-07-13 ENCOUNTER — Other Ambulatory Visit: Payer: Self-pay

## 2016-07-13 ENCOUNTER — Encounter (HOSPITAL_COMMUNITY): Payer: Self-pay | Admitting: *Deleted

## 2016-07-13 VITALS — BP 140/86 | HR 96 | Temp 99.1°F | Resp 20 | Ht 67.5 in | Wt 133.3 lb

## 2016-07-13 DIAGNOSIS — K5730 Diverticulosis of large intestine without perforation or abscess without bleeding: Secondary | ICD-10-CM | POA: Diagnosis present

## 2016-07-13 DIAGNOSIS — K5731 Diverticulosis of large intestine without perforation or abscess with bleeding: Secondary | ICD-10-CM | POA: Diagnosis present

## 2016-07-13 DIAGNOSIS — K769 Liver disease, unspecified: Secondary | ICD-10-CM

## 2016-07-13 DIAGNOSIS — K922 Gastrointestinal hemorrhage, unspecified: Secondary | ICD-10-CM | POA: Diagnosis present

## 2016-07-13 DIAGNOSIS — C19 Malignant neoplasm of rectosigmoid junction: Secondary | ICD-10-CM | POA: Diagnosis present

## 2016-07-13 DIAGNOSIS — Z882 Allergy status to sulfonamides status: Secondary | ICD-10-CM | POA: Diagnosis not present

## 2016-07-13 DIAGNOSIS — K633 Ulcer of intestine: Secondary | ICD-10-CM | POA: Diagnosis present

## 2016-07-13 DIAGNOSIS — Y838 Other surgical procedures as the cause of abnormal reaction of the patient, or of later complication, without mention of misadventure at the time of the procedure: Secondary | ICD-10-CM | POA: Diagnosis present

## 2016-07-13 DIAGNOSIS — C187 Malignant neoplasm of sigmoid colon: Secondary | ICD-10-CM

## 2016-07-13 DIAGNOSIS — K91840 Postprocedural hemorrhage and hematoma of a digestive system organ or structure following a digestive system procedure: Secondary | ICD-10-CM | POA: Diagnosis present

## 2016-07-13 DIAGNOSIS — D62 Acute posthemorrhagic anemia: Secondary | ICD-10-CM | POA: Diagnosis not present

## 2016-07-13 DIAGNOSIS — K625 Hemorrhage of anus and rectum: Secondary | ICD-10-CM

## 2016-07-13 DIAGNOSIS — K921 Melena: Secondary | ICD-10-CM | POA: Diagnosis present

## 2016-07-13 LAB — COMPREHENSIVE METABOLIC PANEL
ALT: 11 U/L — AB (ref 14–54)
AST: 18 U/L (ref 15–41)
Albumin: 3.4 g/dL — ABNORMAL LOW (ref 3.5–5.0)
Alkaline Phosphatase: 58 U/L (ref 38–126)
Anion gap: 7 (ref 5–15)
BUN: 9 mg/dL (ref 6–20)
CHLORIDE: 111 mmol/L (ref 101–111)
CO2: 19 mmol/L — AB (ref 22–32)
CREATININE: 0.94 mg/dL (ref 0.44–1.00)
Calcium: 7.9 mg/dL — ABNORMAL LOW (ref 8.9–10.3)
GFR calc non Af Amer: >60 mL/min (ref >60)
Glucose, Bld: 128 mg/dL — ABNORMAL HIGH (ref 65–99)
POTASSIUM: 3.7 mmol/L (ref 3.5–5.1)
SODIUM: 137 mmol/L (ref 135–145)
Total Bilirubin: 0.5 mg/dL (ref 0.3–1.2)
Total Protein: 5.2 g/dL — ABNORMAL LOW (ref 6.5–8.1)

## 2016-07-13 LAB — I-STAT CHEM 8, ED
BUN: 9 mg/dL (ref 6–20)
CHLORIDE: 110 mmol/L (ref 101–111)
Calcium, Ion: 1.1 mmol/L — ABNORMAL LOW (ref 1.13–1.30)
Creatinine, Ser: 0.7 mg/dL (ref 0.44–1.00)
GLUCOSE: 122 mg/dL — AB (ref 65–99)
HEMATOCRIT: 27 % — AB (ref 36.0–46.0)
Hemoglobin: 9.2 g/dL — ABNORMAL LOW (ref 12.0–15.0)
POTASSIUM: 3.7 mmol/L (ref 3.5–5.1)
SODIUM: 141 mmol/L (ref 135–145)
TCO2: 19 mmol/L (ref 0–100)

## 2016-07-13 LAB — CBC WITH DIFFERENTIAL/PLATELET
BASOS ABS: 0 10*3/uL (ref 0.0–0.1)
Basophils Relative: 1 %
Eosinophils Absolute: 0.1 10*3/uL (ref 0.0–0.7)
Eosinophils Relative: 1 %
HEMATOCRIT: 29.8 % — AB (ref 36.0–46.0)
HEMOGLOBIN: 9.8 g/dL — AB (ref 12.0–15.0)
LYMPHS ABS: 1.4 10*3/uL (ref 0.7–4.0)
LYMPHS PCT: 16 %
MCH: 30.2 pg (ref 26.0–34.0)
MCHC: 32.9 g/dL (ref 30.0–36.0)
MCV: 91.7 fL (ref 78.0–100.0)
Monocytes Absolute: 0.6 10*3/uL (ref 0.1–1.0)
Monocytes Relative: 7 %
NEUTROS ABS: 6.1 10*3/uL (ref 1.7–7.7)
NEUTROS PCT: 75 %
Platelets: 178 10*3/uL (ref 150–400)
RBC: 3.25 MIL/uL — AB (ref 3.87–5.11)
RDW: 12.3 % (ref 11.5–15.5)
WBC: 8.2 10*3/uL (ref 4.0–10.5)

## 2016-07-13 LAB — CBC
HEMATOCRIT: 26 % — AB (ref 36.0–46.0)
HEMOGLOBIN: 8.8 g/dL — AB (ref 12.0–15.0)
MCH: 30.8 pg (ref 26.0–34.0)
MCHC: 33.8 g/dL (ref 30.0–36.0)
MCV: 90.9 fL (ref 78.0–100.0)
Platelets: 151 10*3/uL (ref 150–400)
RBC: 2.86 MIL/uL — AB (ref 3.87–5.11)
RDW: 12.3 % (ref 11.5–15.5)
WBC: 6.4 10*3/uL (ref 4.0–10.5)

## 2016-07-13 LAB — ABO/RH: ABO/RH(D): O NEG

## 2016-07-13 LAB — APTT: APTT: 28 s (ref 24–36)

## 2016-07-13 LAB — PROTIME-INR
INR: 1.27
Prothrombin Time: 16 seconds — ABNORMAL HIGH (ref 11.4–15.2)

## 2016-07-13 LAB — I-STAT CG4 LACTIC ACID, ED: LACTIC ACID, VENOUS: 2.11 mmol/L — AB (ref 0.5–1.9)

## 2016-07-13 LAB — TYPE AND SCREEN
ABO/RH(D): O NEG
Antibody Screen: NEGATIVE

## 2016-07-13 LAB — I-STAT BETA HCG BLOOD, ED (MC, WL, AP ONLY): I-stat hCG, quantitative: <5 m[IU]/mL (ref <5)

## 2016-07-13 MED ORDER — ONDANSETRON HCL 4 MG/2ML IJ SOLN
4.0000 mg | Freq: Once | INTRAMUSCULAR | Status: AC
  Administered 2016-07-13: 4 mg via INTRAVENOUS
  Filled 2016-07-13: qty 2

## 2016-07-13 MED ORDER — SODIUM CHLORIDE 0.9 % IV SOLN
INTRAVENOUS | Status: DC
  Administered 2016-07-13 – 2016-07-15 (×3): via INTRAVENOUS

## 2016-07-13 MED ORDER — POLYETHYLENE GLYCOL 3350 17 GM/SCOOP PO POWD
1.0000 | Freq: Once | ORAL | Status: AC
  Administered 2016-07-13: 255 g via ORAL
  Filled 2016-07-13: qty 255

## 2016-07-13 MED ORDER — SODIUM CHLORIDE 0.9 % IV BOLUS (SEPSIS)
1000.0000 mL | Freq: Once | INTRAVENOUS | Status: AC
  Administered 2016-07-13: 1000 mL via INTRAVENOUS

## 2016-07-13 NOTE — Patient Instructions (Signed)
Care Plan Summary- 07/13/2016 Name:  Lodema Bender. Kanno      DOB: 14-Sep-1966 Your Medical Team: Medical Oncologist:  Dr. Ma Rings Radiation Oncologist:    Surgeon:   Dr. Jackolyn Confer / Dr. Stark Klein  Type of Cancer: Adenocarcinoma of Sigmoid Colon  Stage/Grade: Undetermined *Exact staging of your cancer is based on size of the tumor, depth of invasion, involvement of lymph nodes or not, and whether or not the cancer has spread beyond the primary site   Recommendations: Based on information available as of today's consult. Recommendations may change depending on the results of further tests or exams. 1)  PET scan to complete staging as well as baseline CEA level 2)   Bowel resection with liver mass removed 3)   Return to medical oncology postop 4)   Genetics referral due to young age at diagnosis Next Steps: 1) Radiology will call with PET scan (after insurance authorization) 2) Follow up with surgery 3)   ______________________________________________________________________________ Questions? Merceda Elks, RN, BSN at [phone]. Manuela Schwartz is your Oncology Nurse Navigator and is available to assist you.

## 2016-07-13 NOTE — Progress Notes (Signed)
Oncology Nurse Navigator Documentation  Oncology Nurse Navigator Flowsheets 07/13/2016  Navigator Location [facility]  Navigator Encounter Type Initial MedOnc  Abnormal Finding Date -  Confirmed Diagnosis Date -  Patient Visit Type MedOnc;Initial  Treatment Phase Pre-Tx/Tx Discussion  Barriers/Navigation Needs Education  Education Accessing Care/ Finding Providers;Newly Diagnosed Cancer Education;Preparing for Upcoming Surgery/ Treatment  Interventions Coordination of Care;Education Method  Coordination of Care Appts  Education Method Verbal;Written;Teach-back  Support Groups/Services GI Support Group;Other--[facility] Support Services  Acuity Level 2  Time Spent with Patient 65  Met with patient and husband, Patricia Hudson during new patient visit. Explained the role of the GI Nurse Navigator and provided New Patient Packet with information on: 1. Colon cancer--anatomy of colon, CEA marker, PAC information, and general information on chemotherapy 2. Support groups 3. Advanced Directives 4. Fall Safety Plan Answered questions, reviewed current treatment plan using TEACH back and provided emotional support. Provided copy of current treatment plan. 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 They have one child. She has an active, healthy lifestyle with no other medical problems. She is highly motivated to get surgery done and start treatment as soon as possible. Saw Dr. Zella Richer today and is anxious to meet with Dr. Barry Dienes due to having specific questions about the liver resection. Navigator sent message to Dr. Zella Richer to request OK for her to see Dr. Barry Dienes at 07/21/16 [facility]. Message sent to managed care to expedite the authorization for her PET scan. Will schedule for her lab work (CEA) on same day. Will follow up on genetics referral after surgery and chemo gets started. No barriers to care were identified today. Patricia Elks, RN, BSN GI Oncology [facility]

## 2016-07-13 NOTE — Progress Notes (Signed)
[facility] Patient Consult   Referring MD: Juanita Craver, Md [address], [place], [address] [place], [place] [address]   HAROLD MATTES 50 y.o.  [birth year]    Reason for Referral: Colon cancer   HPI: Ms. Greer was referred to Dr. Collene Mares with a one-month history of blood in the stool. She reports no other complaint. She was taken to a colonoscopy by Dr. Collene Mares on 07/07/2016. A mass was found in the sigmoid colon at 20 cm from the anal verge the mass was biopsied and the area was tattooed. A polyp was removed from the transverse colon. Another polyp was removed from the mid ascending colon. An additional polyp was removed from the cecum. The pathology at [facility] revealed tubular adenomas at the transverse and ascending colon. The cecum polyp returned as a sessile serrated adenoma. The mass from the rectosigmoid returned as invasive adenocarcinoma. Immunohistochemical stains for mismatch repair proteins are pending.  She was referred for CTs of the chest, abdomen, and pelvis. The CT of the chest revealed no evidence of metastatic disease. The CT of the abdomen and pelvis on 07/07/2016 revealed cysts in the right and left liver. A 10 mm low-attenuation lesion in the inferior right liver was indeterminate. A subtle area of wall thickening was noted near the rectosigmoid junction. Several small lymph nodes in the sigmoid mesocolon.  An MRI of the abdomen on 07/07/2016 revealed multiple bilateral hepatic cysts. In the inferior subcapsular right hepatic lobe a 7 mm lesion is suspicious for metastasis. No evidence of extrahepatic metastatic disease in the abdomen.  Ms. Doyle Askew was referred to Dr. Zella Richer and saw him earlier today. She is being scheduled for a sigmoid colectomy and liver resection.  She reports feeling well  Past Medical History:  Diagnosis Date  . Renal calculi 1994    .  Migraine headaches   .  G1 P1  Past Surgical History:  Procedure Laterality Date  .  TONSILLECTOMY AND ADENOIDECTOMY  1970's    Medications: Reviewed  Allergies:  Allergies  Allergen Reactions  . Sulfa Antibiotics     Kathreen Cosier Syndrome    Family history: Her maternal grandmother had breast cancer. Her mother had breast cancer. A paternal aunt had breast cancer. A maternal uncle had pancreas cancer in his 64s.  Social History:   She lives with her husband and Arnold. She works as an Forensic psychologist. She does not use cigarettes. Rare alcohol use. No transfusion history. No risk factor for HIV or hepatitis.    ROS:   Positives include: Blood in the stool, anxiety after being diagnosed with colon cancer  A complete ROS was otherwise negative.  Physical Exam:  Blood pressure 140/86, pulse 96, temperature 99.1 F (37.3 C), temperature source Oral, resp. rate 20, height 5' 7.5" (1.715 m), weight 133 lb 4.8 oz (60.5 kg), SpO2 99 %.  HEENT: Oropharynx without visible mass, neck without mass Lungs: Clear bilaterally Cardiac: Regular rate and rhythm Abdomen: No hepatosplenomegaly, no mass, nontender, no apparent ascites  Vascular: No leg edema Lymph nodes: No cervical, supraclavicular, axillary, or inguinal nodes Neurologic: Alert and oriented, the motor exam appears intact in the upper and lower extremities Skin: No rash Musculoskeletal: No spine tenderness   LAB:  CBC on 06/21/2016-hemoglobin 14.2, platelets 224,000, white count 4.5, creatinine 0.74     Imaging:  As per history of present illness, CT abdomen/pelvis 07/07/2016 and MRI abdomen 07/07/2016 reviewed at the GI tumor conference 07/12/2016   Assessment/Plan:  1. Adenocarcinoma of the sigmoid colon, status post a colonoscopic biopsy 07/07/2016  Sigmoid mass noted at 20 cm from the anal verge  Staging CTs of the chest, abdomen, and pelvis negative for evidence of metastatic disease other than an indeterminate 10 mm right liver lesion  MRI of the abdomen 07/07/2016 revealed multiple  bilateral hepatic cyst. 7 mm inferior subcapsular right liver lesion suspicious for a metastasis  2. Rectal bleeding secondary to #1  3.   Multiple polyps on the colonoscopy 07/07/2016  4.   Family history of breast and pancreas cancer   Disposition:   Ms. Morioka has been diagnosed with sigmoid colon cancer. There is a high suspicion of metastatic disease involving an isolated liver lesion.  I discussed treatment options with Ms. Laba and her husband. Her case was presented at the GI tumor conference 07/12/2016. I discussed the case with Dr. Zella Richer earlier today. The consensus recommendation from the tumor conference is to proceed with resection of the primary tumor and right liver lesion.  I recommend a staging PET scan prior to surgery. We will check a baseline CEA.  She understands adjuvant treatment will depend on the pathology from the liver lesion. If the liver lesion is negative for malignancy then adjuvant treatment will be dictated by the surgical pathology from the primary tumor, including mismatch repair protein testing.  She would like to meet with Dr. Barry Dienes to discuss specifics of the liver resection. We will ask Dr. Zella Richer to place a Port-A-Cath if the liver lesion is malignant.  I will plan to see her one to 2 weeks after surgery. We are available to see her sooner as needed.  She will be referred to the genetics clinic based on her personal and family history of cancer.  Approximately 50 minutes were spent with the patient today. The majority of the time was used for counseling and coordination of care.  Betsy Coder, MD  07/13/2016, 3:46 PM

## 2016-07-13 NOTE — ED Provider Notes (Signed)
[facility]-EMER[facility]ENCY DEPT Provider Note   CSN: [id number] Arrival date & time: 07/13/16  2029     History   Chief Complaint Chief Complaint  Patient presents with  . GI Bleeding    HPI Patricia Hudson is a 50 y.o. female.  HPI Patient with recent colonoscopy (1 week ago) with diagnosis of colon cancer presents with GI bleed.  She was using the restroom, and had copious blood output from her rectum with clots.  She syncopized a total of 6 times.  EMS initially had BP 90/p with HR 80.  Patient had [facility] for EMS.  Improved with trendelenburg position and IVF.  She has nausea and has vomited.  Past Medical History:  Diagnosis Date  . Renal calculi 1994    Patient Active Problem List   Diagnosis Date Noted  . Cancer of sigmoid colon (HCC) 07/07/2016  . Encounter for IUD removal 06/02/2016    Past Surgical History:  Procedure Laterality Date  . COLONOSCOPY    . TONSILLECTOMY AND ADENOIDECTOMY  1970's    OB History    Gravida Para Term Preterm AB Living   1 1 1 0 0 1   SAB TAB Ectopic Multiple Live Births   0 0 0 0 1       Home Medications    Prior to Admission medications   Medication Sig Start Date End Date Taking? Authorizing Provider  Acetaminophen (TYLENOL PO) Take by mouth as needed.    Historical Provider, MD  IBUPROFEN PO Take by mouth as needed (chewable).    Historical Provider, MD    Family History Family History  Problem Relation Age of Onset  . Diabetes Father   . Hypertension Father   . Lung cancer Father 76  . Breast cancer Mother 72    tripple negative.BRCA Neg  . Breast cancer Maternal Grandmother 60    double mastectomy  . Breast cancer Paternal Aunt 70    Social History Social History  Substance Use Topics  . Smoking status: Never Smoker  . Smokeless tobacco: Never Used  . Alcohol use Yes     Comment: rare     Allergies   Sulfa antibiotics   Review of Systems Review of Systems  Constitutional: Negative for chills and fever.    HENT: Negative for ear pain and sore throat.   Eyes: Negative for pain and visual disturbance.  Respiratory: Negative for cough and shortness of breath.   Cardiovascular: Negative for chest pain and palpitations.  Gastrointestinal: Positive for blood in stool, nausea and vomiting. Negative for abdominal pain.  Genitourinary: Negative for dysuria and hematuria.  Musculoskeletal: Negative for arthralgias and back pain.  Skin: Negative for color change and rash.  Neurological: Positive for syncope. Negative for seizures.  All other systems reviewed and are negative.    Physical Exam Updated Vital Signs BP 104/69   Pulse 74   Temp 98.1 F (36.7 C) (Oral)   Resp 14   SpO2 100%   Physical Exam  Constitutional: She appears well-developed and well-nourished. No distress.  HENT:  Head: Normocephalic and atraumatic.  Eyes: Conjunctivae are normal.  Neck: Neck supple.  Cardiovascular: Normal rate and regular rhythm.   No murmur heard. Pulmonary/Chest: Effort normal and breath sounds normal. No respiratory distress.  Abdominal: Soft. There is no tenderness.  Genitourinary: No vaginal discharge found.  Genitourinary Comments: Bright red blood and clots at rectum  Musculoskeletal: She exhibits no edema.  Neurological: She is alert.  Skin: Skin is warm   and dry. There is pallor.  Cool, diaphoretic  Psychiatric: She has a normal mood and affect.  Nursing note and vitals reviewed.    ED Treatments / Results  Labs (all labs ordered are listed, but only abnormal results are displayed) Labs Reviewed  COMPREHENSIVE METABOLIC PANEL  CBC WITH DIFFERENTIAL/PLATELET  PROTIME-INR  APTT  I-STAT BETA HCG BLOOD, ED ([facility], [facility], [facility] ONLY)  I-STAT CHEM 8, ED  I-STAT CG4 LACTIC ACID, ED  TYPE AND SCREEN    EKG  EKG Interpretation None       Radiology No results found.  Procedures Procedures (including critical care time)  Medications Ordered in ED Medications  sodium chloride 0.9  % bolus 1,000 mL (1,000 mLs Intravenous New Bag/Given 07/13/16 2057)  ondansetron (ZOFRAN) injection 4 mg (not administered)     Initial Impression / Assessment and Plan / ED Course  I have reviewed the triage vital signs and the nursing notes.  Pertinent labs & imaging results that were available during my care of the patient were reviewed by me and considered in my medical decision making (see chart for details).  Clinical Course    Patient advises likely lower GI bleed. Patient was hemodynamically stable upon my assessment. Patient was warmed with blankets and warm fluid pending labs. Dr. Benson Norway, gastroenterology, was consulted and will admit patient. Labs are significant for mild elevated lactate, hemoglobin of 9 with no known baseline.  Discussed with GI and they will trend CBCs.  Patient HDS.  Will be admitted to Gi.    Final Clinical Impressions(s) / ED Diagnoses   Final diagnoses:  None    New Prescriptions New Prescriptions   No medications on file     Levada Schilling, MD 07/13/16 [place], DO 07/13/16 2346

## 2016-07-13 NOTE — ED Notes (Signed)
IV attempted x 1 by ems student, unsuccessful

## 2016-07-13 NOTE — ED Triage Notes (Signed)
Pt to ED from home by [facility] c/o GI bleeding. On Friday, pt had a colonoscopy and dx with colon cancer. Today, pt went to the restroom, had a syncopal episode while on the toilet. Husband assisted pt to the floor. On EMS arrival, pt pale with several other syncopal episodes prior to moving to EMS stretcher. 30-88ml of bright red blood noted in toilet. Had one episode of emesis, given 4mg  zofran. Initial bp 90/0 hr 80. Pt alert and oriented x 4 on arrival, large amounts of bright red blood with large clots noted

## 2016-07-13 NOTE — ED Notes (Signed)
Dr Hung at bedside °

## 2016-07-13 NOTE — H&P (Signed)
Patricia Hudson 07/13/2016 11:36 AM Location: [facility] Patient #: [id number] DOB: 08/14/1966 Married / Language: [name] / Race: White Female  History of Present Illness Patricia Hollingshead MD; 07/13/2016 12:46 PM) Patient words: new pt, colon & liver mass.  The patient is a 50 year old female.   Note:She was referred by Dr. 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 She was in her normal state of health. Approximately 5 weeks ago she noted some tissue and little blood streaking in her stool. No abdominal pain. No weight loss. No other symptoms. She went for colonoscopy and was found to have a moderate sized lesion at 20 cm from the sigmoid colon as well as some other small polyps throughout the colon. All the other polyps were benign. The larger lesion demonstrated findings consistent with moderately differentiated adenocarcinoma. A chest/abdomen/pelvis CT scan was done which demonstrates an area of irregularity in the sigmoid colon as well as some slightly enlarged lymph nodes surrounding this. There are also cysts in the liver and a 10 mm lesion in the right inferior lobe of the liver. No lung lesions. MRI of the liver and abdomen demonstrated findings that made the lesion suspicious for malignancy but not diagnostic. She was presented at GI cancer conference yesterday. She is due to see Dr. Benay Spice this afternoon. Her husband is here with her. There is no family history of colon cancer but there are other cancers in her family including breast cancer and pancreatic cancer.  Other Problems (April Staton, [place]; 07/13/2016 11:37 AM) Colon Cancer  Past Surgical History (April Staton, [place]; 07/13/2016 11:37 AM) Colon Polyp Removal - Colonoscopy  Diagnostic Studies History (April Staton, [place]; 07/13/2016 11:37 AM) Colonoscopy within last year Mammogram within last year Pap Smear 1-5 years  ago  Allergies (April Staton, CMA; 07/13/2016 11:38 AM) Sulfa Antibiotics  Medication History (April Staton, CMA; 07/13/2016 11:38 AM) No Current Medications Medications Reconciled  Social History (April Staton, CMA; 07/13/2016 11:37 AM) Alcohol use Occasional alcohol use. Caffeine use Carbonated beverages, Tea. No drug use Tobacco use Never smoker.  Family History (April Staton, [place]; 07/13/2016 11:37 AM) Breast Cancer Mother. Cancer Father. Colon Polyps Brother. Diabetes Mellitus Father. Heart Disease Father. Hypertension Father. Respiratory Condition Father.  Pregnancy / Birth History (April Staton, [place]; 07/13/2016 11:37 AM) Age at menarche 80 years. Age of menopause [age] Contraceptive History Oral contraceptives. Gravida 1 Length (months) of breastfeeding 7-12 Maternal age 26-35 Para 1     Review of Systems (April Staton CMA; 07/13/2016 11:37 AM) General Not Present- Appetite Loss, Chills, Fatigue, Fever, Night Sweats, Weight Gain and Weight Loss. Skin Not Present- Change in Wart/Mole, Dryness, Hives, Jaundice, New Lesions, Non-Healing Wounds, Rash and Ulcer. HEENT Present- Wears glasses/contact lenses. Not Present- Earache, Hearing Loss, Hoarseness, Nose Bleed, Oral Ulcers, Ringing in the Ears, Seasonal Allergies, Sinus Pain, Sore Throat, Visual Disturbances and Yellow Eyes. Respiratory Not Present- Bloody sputum, Chronic Cough, Difficulty Breathing, Snoring and Wheezing. Breast Not Present- Breast Mass, Breast Pain, Nipple Discharge and Skin Changes. Cardiovascular Not Present- Chest Pain, Difficulty Breathing Lying Down, Leg Cramps, Palpitations, Rapid Heart Rate, Shortness of Breath and Swelling of Extremities. Gastrointestinal Present- Bloody Stool. Not Present- Abdominal Pain, Bloating, Change in Bowel Habits, Chronic diarrhea, Constipation, Difficulty Swallowing, Excessive gas, Gets full quickly at meals, Hemorrhoids, Indigestion, Nausea, Rectal  Pain and Vomiting. Female Genitourinary Not Present- Frequency, Nocturia, Painful Urination, Pelvic Pain and Urgency. Musculoskeletal Not Present- Back Pain,  Joint Pain, Joint Stiffness, Muscle Pain, Muscle Weakness and Swelling of Extremities. Neurological Not Present- Decreased Memory, Fainting, Headaches, Numbness, Seizures, Tingling, Tremor, Trouble walking and Weakness. Psychiatric Not Present- Anxiety, Bipolar, Change in Sleep Pattern, Depression, Fearful and Frequent crying. Endocrine Not Present- Cold Intolerance, Excessive Hunger, Hair Changes, Heat Intolerance, Hot flashes and New Diabetes. Hematology Not Present- Blood Thinners, Easy Bruising, Excessive bleeding, Gland problems, HIV and Persistent Infections.  Vitals (April Staton CMA; 07/13/2016 11:39 AM) 07/13/2016 11:38 AM Weight: 134 lb Height: 67in Height was reported by patient. Body Surface Area: 1.71 m Body Mass Index: 20.99 kg/m  Temp.: 98.55F(Oral)  Pulse: 95 (Regular)  P.OX: 98% (Room air) BP: 149/90 (Sitting, Left Arm, Standard)      Physical Exam Patricia Hollingshead MD; 07/13/2016 12:47 PM)  The physical exam findings are as follows: Note:General: Thin female in NAD. Pleasant and cooperative.  HEENT: [place]/AT, no external nasal or ear masses, mucous membranes are moist  EYES: EOMI, no scleral icterus, pupils normal  NECK: Supple, no obvious mass or thyroid mass/enlargement, no trachea deviation  CV: RRR, no murmur, no edema  CHEST: Breath sounds equal and clear. Respirations nonlabored.  ABDOMEN: Soft, nontender, nondistended, no masses, no organomegaly, active bowel sounds, no scars, no hernias.  MUSCULOSKELETAL: FROM, good muscle tone, no edema, no venous stasis changes, normal station and gait  LYMPHATIC: No palpable cervical, supraclavicular adenopathy.  SKIN: No jaundice or suspicious rashes.  NEUROLOGIC: Alert and oriented, answers questions appropriately, normal gait and  station.  PSYCHIATRIC: Normal mood, affect , and behavior.    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 Mutation testing pending. I have reviewed the CT scans, colonoscopy, an MRI. I have discussed her situation with Dr. Collene Mares and Dr. Benay Spice.  Plan: Laparoscopic-assisted partial colectomy and resection of liver mass. I have explained the procedure and risks of colon resection. Risks include but are not limited to bleeding, infection, wound problems, anesthesia, anastomotic leak, need for colostomy, need for reoperative surgery, bile leak, injury to intraabominal organs (such as intestine, spleen, kidney, bladder, ureter, etc.), ileus, irregular bowel habits. She seems to understand and agrees to proceed.  Current Plans Follow up as needed Schedule for Surgery Pt Education - [facility] Colon Bowel Prep 2015 Miralax/Antibiotics Started Neomycin Sulfate 500MG , 2 (two) Tablet SEE NOTE, #6, 07/13/2016, No Refill. Local Order: TAKE TWO TABLETS AT 2 PM, 3 PM, AND 10 PM THE DAY PRIOR TO SURGERY Started Flagyl 500MG , 2 (two) Tablet SEE NOTE, #6, 07/13/2016, No Refill. Local Order: Take at 2pm, 3pm, and 10pm the day prior to your colon operation Pt Education - [facility] Free Text Education/Instructions: discussed with patient and provided information. LIVER MASS, RIGHT LOBE (R16.0) Impression: 7-10 mm in size. Suspicious but not definitive for metastasis on MRI. PET scan could be helpful but I will leave this up to Dr. Benay Spice.  Plan: Resection of mass during partial colectomy.  Jackolyn Confer, MD

## 2016-07-13 NOTE — H&P (Signed)
  Patricia Hudson HPI: This is a 50 year old female with a recent diagnosis of a rectosigmoid colon cancer one week ago and no other significant PMH.  She underwent a colonoscopy with Dr. Collene Mares for complaints of hematochezia the colonoscopy identified the rectosigmoid lesion as well as some proximal colon polyps, i.e., cecum and ascending colon.  These polyps were adenomas and a sessile serrated adenoma that was removed with cautery.  This evening at 7 PM she experienced an episode of hematochezia that resulted in a syncopal episode.  This was shortly followed by more bleeding and 5 more episodes of syncope per her husband, Dr. Olen Pel.  EMS was summoned and she was transported to [facility].  With fluid resuscitation her SBP increased in to the lower 100's, but in the field her SBP was in the 90 range.  Her HR currently is at 82 and she feels better with the fluids.  In the ER she reports that her bleeding has stopped for the time being.  Past Medical History:  Diagnosis Date  . Renal calculi 1994    Past Surgical History:  Procedure Laterality Date  . COLONOSCOPY    . TONSILLECTOMY AND ADENOIDECTOMY  1970's    Family History  Problem Relation Age of Onset  . Diabetes Father   . Hypertension Father   . Lung cancer Father 14  . Breast cancer Mother 64    tripple negative.BRCA Neg  . Breast cancer Maternal Grandmother 60    double mastectomy  . Breast cancer Paternal Aunt 71    Social History:  reports that she has never smoked. She has never used smokeless tobacco. She reports that she drinks alcohol. She reports that she does not use drugs.  Allergies:  Allergies  Allergen Reactions  . Sulfa Antibiotics     Kathreen Cosier Syndrome    Medications:  Scheduled: . polyethylene glycol powder  1 Container Oral Once   Continuous: . sodium chloride    . sodium chloride 1,000 mL (07/13/16 2057)    No results found for this or any previous visit (from the past 24 hour(s)).    No results found.  ROS:  As stated above in the HPI otherwise negative.  Blood pressure 128/93, pulse 103, temperature 98.1 F (36.7 C), temperature source Oral, resp. rate 17, SpO2 100 %.    PE: Gen: NAD, Alert and Oriented, weak and pale HEENT:  [place]/AT, EOMI Neck: Supple, no LAD Lungs: CTA Bilaterally CV: RRR without M/G/R ABM: Soft, NTND, +BS Ext: No C/C/E  Assessment/Plan: 1) Post polypectomy bleed. 2) Recent diagnosis of a rectosigmoid colon cancer. 3) Possible metastatic focus in the liver.   The patient has improved hemodynamically with the fluid resuscitation.  With the recent colonoscopy and the severity of her bleeding, it is most likely a post polypectomy bleed.  I plan to repeat the colonoscopy in the AM and I told her and her husband that the prep may induce further bleeding.  If she becomes hemodynamically compromised, IR will be available to intervene.  I spoke with Dr. Earleen Newport and he is aware of the patient.    Plan: 1) Admit to Stepdown. 2) CBC q6 hours x 24 hours. 3) IV fluids at 200 ml/hour. 4) Prep for the colonoscopy in the AM.  Sheran Newstrom D 07/13/2016, 9:14 PM

## 2016-07-14 ENCOUNTER — Telehealth: Payer: Self-pay | Admitting: *Deleted

## 2016-07-14 ENCOUNTER — Encounter (HOSPITAL_COMMUNITY)
Admission: EM | Disposition: A | Discharge status: Home / Self Care | Payer: Self-pay | Source: Home / Self Care | Attending: Gastroenterology

## 2016-07-14 ENCOUNTER — Encounter (HOSPITAL_COMMUNITY): Payer: Self-pay | Admitting: *Deleted

## 2016-07-14 HISTORY — PX: COLONOSCOPY: SHX5424

## 2016-07-14 LAB — CBC
HCT: 23.2 % — ABNORMAL LOW (ref 36.0–46.0)
Hemoglobin: 7.9 g/dL — ABNORMAL LOW (ref 12.0–15.0)
MCH: 30.7 pg (ref 26.0–34.0)
MCHC: 34.1 g/dL (ref 30.0–36.0)
MCV: 90.3 fL (ref 78.0–100.0)
Platelets: 145 10*3/uL — ABNORMAL LOW (ref 150–400)
RBC: 2.57 MIL/uL — ABNORMAL LOW (ref 3.87–5.11)
RDW: 12.5 % (ref 11.5–15.5)
WBC: 4.8 10*3/uL (ref 4.0–10.5)

## 2016-07-14 LAB — LACTIC ACID, PLASMA
LACTIC ACID, VENOUS: 0.7 mmol/L (ref 0.5–1.9)
LACTIC ACID, VENOUS: 0.8 mmol/L (ref 0.5–1.9)

## 2016-07-14 LAB — MRSA PCR SCREENING: MRSA BY PCR: NEGATIVE

## 2016-07-14 SURGERY — COLONOSCOPY
Anesthesia: Moderate Sedation

## 2016-07-14 MED ORDER — MIDAZOLAM HCL 5 MG/ML IJ SOLN
INTRAMUSCULAR | Status: AC
  Filled 2016-07-14: qty 2

## 2016-07-14 MED ORDER — SODIUM CHLORIDE 0.9 % IV SOLN
1000.0000 mg | Freq: Once | INTRAVENOUS | Status: AC
  Administered 2016-07-14: 1000 mg via INTRAVENOUS
  Filled 2016-07-14 (×2): qty 20

## 2016-07-14 MED ORDER — DIPHENHYDRAMINE HCL 50 MG/ML IJ SOLN
INTRAMUSCULAR | Status: AC
  Filled 2016-07-14: qty 1

## 2016-07-14 MED ORDER — POLYETHYLENE GLYCOL 3350 17 GM/SCOOP PO POWD: 1.0000 | Freq: Once | ORAL | Status: DC

## 2016-07-14 MED ORDER — DIPHENHYDRAMINE HCL 50 MG/ML IJ SOLN
INTRAMUSCULAR | Status: DC | PRN
  Administered 2016-07-14 (×2): 12.5 mg via INTRAVENOUS

## 2016-07-14 MED ORDER — FENTANYL CITRATE (PF) 100 MCG/2ML IJ SOLN
INTRAMUSCULAR | Status: AC
  Filled 2016-07-14: qty 2

## 2016-07-14 MED ORDER — FENTANYL CITRATE (PF) 100 MCG/2ML IJ SOLN
INTRAMUSCULAR | Status: DC | PRN
  Administered 2016-07-14 (×2): 25 ug via INTRAVENOUS
  Administered 2016-07-14: 12.5 ug via INTRAVENOUS
  Administered 2016-07-14: 25 ug via INTRAVENOUS

## 2016-07-14 MED ORDER — MIDAZOLAM HCL 5 MG/5ML IJ SOLN
INTRAMUSCULAR | Status: DC | PRN
  Administered 2016-07-14 (×2): 2 mg via INTRAVENOUS
  Administered 2016-07-14: 1 mg via INTRAVENOUS
  Administered 2016-07-14: 2 mg via INTRAVENOUS

## 2016-07-14 MED ORDER — SODIUM CHLORIDE 0.9 % IV SOLN: INTRAVENOUS | Status: DC

## 2016-07-14 MED ORDER — IRON DEXTRAN 50 MG/ML IJ SOLN
25.0000 mg | Freq: Once | INTRAMUSCULAR | Status: AC
  Administered 2016-07-14: 25 mg via INTRAVENOUS
  Filled 2016-07-14: qty 0.5

## 2016-07-14 NOTE — Op Note (Signed)
[facility] Patient Name: Patricia Hudson Procedure Date : 07/14/2016 MRN: [id number] Attending MD: Carol Ada , MD Date of Birth: 1966/10/04 CSN: [id number] Age: 50 Admit Type: Inpatient Procedure:                Colonoscopy Indications:              Hematochezia Providers:                Carol Ada, MD, Sarah Monday RN, RN, Ralene Bathe,                            Technician Referring MD:              Medicines:                Midazolam 7 mg IV, Fentanyl 87.5 micrograms IV,                            Diphenhydramine 25 mg IV Complications:            No immediate complications. Estimated Blood Loss:     Estimated blood loss: none. Procedure:                Pre-Anesthesia Assessment:                           - Prior to the procedure, a History and Physical                            was performed, and patient medications and                            allergies were reviewed. The patient's tolerance of                            previous anesthesia was also reviewed. The risks                            and benefits of the procedure and the sedation                            options and risks were discussed with the patient.                            All questions were answered, and informed consent                            was obtained. Prior Anticoagulants: The patient has                            taken no previous anticoagulant or antiplatelet                            agents. ASA Grade Assessment: I - A normal, healthy  patient. After reviewing the risks and benefits,                            the patient was deemed in satisfactory condition to                            undergo the procedure.                           - Sedation was administered by an endoscopy nurse.                            The sedation level attained was moderate.                           After obtaining informed consent, the colonoscope           was passed under direct vision. Throughout the                            procedure, the patient's blood pressure, pulse, and                            oxygen saturations were monitored continuously. The                            EC-[id number] scope was introduced through                            the anus and advanced to the the cecum, identified                            by appendiceal orifice and ileocecal valve. The                            colonoscopy was performed with difficulty due to                            poor endoscopic visualization and significant                            looping. Successful completion of the procedure was                            aided by applying abdominal pressure. The ileocecal                            valve, appendiceal orifice, and rectum were                            photographed. The quality of the bowel preparation                            was good. Scope In: 8:20:59 AM Scope Out: 9:12:01 AM Scope Withdrawal Time: 0  hours 21 minutes 52 seconds  Total Procedure Duration: 0 hours 51 minutes 2 seconds  Findings:      A single (solitary) fifteen mm ulcer was found in the cecum. No bleeding       was present. Stigmata of recent bleeding were present. For hemostasis,       two hemostatic clips were successfully placed (MR unsafe). There was no       bleeding at the end of the procedure.      A single (solitary) five mm ulcer was found at the hepatic flexure. No       bleeding was present. No stigmata of recent bleeding were seen.      A few medium-mouthed diverticula were found in the sigmoid colon and       descending colon.      A fungating non-obstructing large mass was found in the recto-sigmoid       colon. The mass was non-circumferential. The mass measured two cm in       length. In addition, its diameter measured twenty-one mm. No bleeding       was present.      The procedure was difficult to perform as the  patient was very thin and       the colonoscope looped as well as residual blood precluding adequate       visualization, initially. Also the procedure was performed with       conscious sedation and initially she was difficult to sedate. The cecum       was intubated and a large 1.5 cm post polypectomy ulcer was identified.       On the edge of the ulcer there was a visible vessel and this was the       source of bleeding. No active bleeding or adherent clot was identified.       Two hemoclips were successfully placed after struggling with colonic       contractions. No bleeding was precipitated. A secondary post polypectomy       ulcer with a clean base was noted at the hepatic flexure. No       intervention was necessary for this site. At 20 cm the rectosigmoid       colon cancer was identified. No stigmata of bleeding from this site. Impression:               - A single (solitary) ulcer in the cecum. Clips (MR                            unsafe) were placed.                           - A single (solitary) ulcer at the hepatic flexure.                           - Diverticulosis in the sigmoid colon and in the                            descending colon.                           - Malignant tumor in the recto-sigmoid colon.                           -  No specimens collected. Moderate Sedation:      Moderate (conscious) sedation was administered by the endoscopy nurse       and supervised by the endoscopist. The following parameters were       monitored: oxygen saturation, heart rate, blood pressure, and response       to care. Recommendation:           - Return patient to hospital ward for ongoing care.                           - Clear liquid diet.                           - Continue present medications.                           - Follow HGB. I will discuss with the family about                            transfusions.                           - Repeat colonoscopy in 1 year for  surveillance. Procedure Code(s):        --- Professional ---                           [phone], Colonoscopy, flexible; with control of                            bleeding, any method Diagnosis Code(s):        --- Professional ---                           K63.3, Ulcer of intestine                           C19, Malignant neoplasm of rectosigmoid junction                           K92.1, Melena (includes Hematochezia)                           K57.30, Diverticulosis of large intestine without                            perforation or abscess without bleeding CPT copyright 2016 American Medical Association. All rights reserved. The codes documented in this report are preliminary and upon coder review may  be revised to meet current compliance requirements. Carol Ada, MD Carol Ada, MD 07/14/2016 9:24:11 AM This report has been signed electronically. Number of Addenda: 0

## 2016-07-14 NOTE — Progress Notes (Addendum)
MEDICATION RELATED CONSULT NOTE - INITIAL   Pharmacy Consult for IV iron Indication: S/P colonic post-polypectomy bleed  Allergies  Allergen Reactions  . Sulfa Antibiotics     Kathreen Cosier Syndrome    Patient Measurements: Height: 5\' 8"  (172.7 cm) Weight: 138 lb 0.1 oz (62.6 kg) IBW/kg (Calculated) : 63.9 Adjusted Body Weight: 63  Vital Signs: Temp: 98.1 F (36.7 C) (08/25 0947) Temp Source: Oral (08/25 0947) BP: 89/53 (08/25 1015) Pulse Rate: 65 (08/25 1015) Intake/Output from previous day: 08/24 0701 - 08/25 0700 In: 2853.3 [I.V.:2853.3] Out: 500 [Stool:500] Intake/Output from this shift: Total I/O In: 420 [I.V.:420] Out: -   Labs:  Recent Labs  07/13/16 2108 07/13/16 2118 07/13/16 2230 07/14/16 0615  WBC 8.2  --  6.4 4.8  HGB 9.8* 9.2* 8.8* 7.9*  HCT 29.8* 27.0* 26.0* 23.2*  PLT 178  --  151 145*  APTT 28  --   --   --   CREATININE 0.94 0.70  --   --   ALBUMIN 3.4*  --   --   --   PROT 5.2*  --   --   --   AST 18  --   --   --   ALT 11*  --   --   --   ALKPHOS 58  --   --   --   BILITOT 0.5  --   --   --    Estimated Creatinine Clearance: 85 mL/min (by C-G formula based on SCr of 0.8 mg/dL).   Microbiology: Recent Results (from the past 720 hour(s))  MRSA PCR Screening     Status: None   Collection Time: 07/13/16 10:15 PM  Result Value Ref Range Status   MRSA by PCR NEGATIVE NEGATIVE Final    Comment:        The GeneXpert MRSA Assay (FDA approved for NASAL specimens only), is one component of a comprehensive MRSA colonization surveillance program. It is not intended to diagnose MRSA infection nor to guide or monitor treatment for MRSA infections.     Medical History: Past Medical History:  Diagnosis Date  . Renal calculi 1994    Assessment: 50yo female with recent diagnosis of rectosigmoid colon cancer who presented last PM with hematochezia and dropping Hg.  We are to supplement with IV Fe.  Pt is currently Clear liquids  Goal  of Therapy:  Hg > 12  Plan:  Infed 25mg  IV x 1, test dose Infed 1000mg  IV x 1 if no reaction to test dose    Gracy Bruins, PharmD Clinical Pharmacist [facility]

## 2016-07-14 NOTE — Interval H&P Note (Signed)
History and Physical Interval Note:  07/14/2016 8:15 AM  Patricia Hudson  has presented today for surgery, with the diagnosis of Post polypectomy bleed  The various methods of treatment have been discussed with the patient and family. After consideration of risks, benefits and other options for treatment, the patient has consented to  Procedure(s): COLONOSCOPY (N/A) as a surgical intervention .  The patient's history has been reviewed, patient examined, no change in status, stable for surgery.  I have reviewed the patient's chart and labs.  Questions were answered to the patient's satisfaction.     Bates Collington D

## 2016-07-14 NOTE — Progress Notes (Signed)
Per MD Collene Mares, patient is allowed to have ice cream.

## 2016-07-14 NOTE — Telephone Encounter (Signed)
Oncology Nurse Navigator Documentation  Oncology Nurse Navigator Flowsheets 07/14/2016  Navigator Location [facility]  Navigator Encounter Type Telephone  Telephone Outgoing Call;Appt Confirmation/Clarification  Abnormal Finding Date -  Confirmed Diagnosis Date -  Patient Visit Type -  Treatment Phase -  Barriers/Navigation Needs Coordination of Care--referral to Dr. Barry Dienes, labs, PET  Education -  Interventions Coordination of Care--Dr. Barry Dienes will see her 9/1 at [place] in clinic  Coordination of Care Appts--will have lab on 9/1 at 1030 and PET at 1200. NPO 6 hours prior.  Education Method -  Support Groups/Services -  Acuity -  Time Spent with Patient 15  Notified husband of appointments with surgeon, lab and PET and prep. Informed him that per Dr. Benay Spice after looking at her scan looks most likely that the liver lesion is a met. He understands and will not tell Patricia Hudson at this time. She is currently in [facility] ICU s/p GI bleed from polypectomy site. She is doing better now.

## 2016-07-15 DIAGNOSIS — K922 Gastrointestinal hemorrhage, unspecified: Secondary | ICD-10-CM

## 2016-07-15 DIAGNOSIS — D62 Acute posthemorrhagic anemia: Secondary | ICD-10-CM

## 2016-07-15 LAB — CBC
HEMATOCRIT: 23.5 % — AB (ref 36.0–46.0)
Hemoglobin: 7.8 g/dL — ABNORMAL LOW (ref 12.0–15.0)
MCH: 30.6 pg (ref 26.0–34.0)
MCHC: 33.2 g/dL (ref 30.0–36.0)
MCV: 92.2 fL (ref 78.0–100.0)
Platelets: 120 10*3/uL — ABNORMAL LOW (ref 150–400)
RBC: 2.55 MIL/uL — ABNORMAL LOW (ref 3.87–5.11)
RDW: 12.7 % (ref 11.5–15.5)
WBC: 3.5 10*3/uL — ABNORMAL LOW (ref 4.0–10.5)

## 2016-07-15 LAB — BASIC METABOLIC PANEL
Anion gap: 4 — ABNORMAL LOW (ref 5–15)
CALCIUM: 8.4 mg/dL — AB (ref 8.9–10.3)
CO2: 23 mmol/L (ref 22–32)
CREATININE: 0.75 mg/dL (ref 0.44–1.00)
Chloride: 115 mmol/L — ABNORMAL HIGH (ref 101–111)
GFR calc non Af Amer: >60 mL/min (ref >60)
GLUCOSE: 88 mg/dL (ref 65–99)
Potassium: 3.6 mmol/L (ref 3.5–5.1)
Sodium: 142 mmol/L (ref 135–145)

## 2016-07-15 MED ORDER — ACETAMINOPHEN 325 MG PO TABS
650.0000 mg | ORAL_TABLET | Freq: Once | ORAL | Status: AC
  Administered 2016-07-15: 650 mg via ORAL
  Filled 2016-07-15: qty 2

## 2016-07-15 NOTE — Progress Notes (Signed)
  [facility] Covering for Dr. Adriana Mccallum  Subjective:  Colonoscopy repeated 8/25 by Dr. Benson Norway with the following:   - A single (solitary) ulcer in the cecum. Clips (MR unsafe) were placed. - A single (solitary) ulcer at the hepatic flexure. - Diverticulosis in the sigmoid colon and in the descending colon. - Malignant tumor in the recto-sigmoid colon. - No specimens collected.  No further sign of bleeding or BM since colonoscopy.  Patient feels good.  Anticipating discharge today.  Objective:  Vital signs in last 24 hours: Temp:  [98.1 F (36.7 C)-98.8 F (37.1 C)] 98.5 F (36.9 C) (08/26 0315) Pulse Rate:  [64-93] 68 (08/26 0315) Resp:  [12-22] 22 (08/26 0315) BP: (85-121)/(40-82) 107/82 (08/26 0315) SpO2:  [97 %-100 %] 98 % (08/26 0315) Last BM Date: 07/14/16 General:  Alert, Well-developed, in NAD Heart:  Regular rate and rhythm; no murmurs Pulm:  CTAB.  No W/R/R. Abdomen:  Soft, non-distended.  BS present.  Non-tender. Extremities:  Without edema. Neurologic:  Alert and oriented x 4;  grossly normal neurologically. Psych:  Alert and cooperative. Normal mood and affect.  Intake/Output from previous day: 08/25 0701 - 08/26 0700 In: 3742.1 [P.O.:720; I.V.:2502.1; IV Piggyback:520] Out: 3200 [Urine:3200] Intake/Output this shift: No intake/output data recorded.  Lab Results:  Recent Labs  07/13/16 2230 07/14/16 0615 07/15/16 0257  WBC 6.4 4.8 3.5*  HGB 8.8* 7.9* 7.8*  HCT 26.0* 23.2* 23.5*  PLT 151 145* 120*   BMET  Recent Labs  07/13/16 2108 07/13/16 2118 07/15/16 0255  NA 137 141 142  K 3.7 3.7 3.6  CL 111 110 115*  CO2 19*  --  23  GLUCOSE 128* 122* 88  BUN 9 9 <5*  CREATININE 0.94 0.70 0.75  CALCIUM 7.9*  --  8.4*   LFT  Recent Labs  07/13/16 2108  PROT 5.2*  ALBUMIN 3.4*  AST 18  ALT 11*  ALKPHOS 58  BILITOT 0.5   PT/INR  Recent Labs  07/13/16 2108  LABPROT 16.0*  INR 1.27   Assessment / Plan: 1) Post polypectomy  bleed:  Ulcer seen in cecum with visible vessel and clip placed on repeat colonoscopy 8/25. 2) Recent diagnosis of a rectosigmoid colon cancer. 3) Possible metastatic focus in the liver.  *Hgb is stable.  Received 2 doses of IV iron.  Will advance diet to soft and if no sign of active bleeding then will discharge later this AM.  Having labs with Dr. Benay Spice this coming Friday, which should be adequate as long as no sign of active bleeding.  Discussed plan with patient and her husband, Dr. Olen Pel, and they are in agreement with the plan.   LOS: 2 days   ZEHR, JESSICA D.  07/15/2016, 8:50 AM  Pager number [phone]    Attending physician's note   I have reviewed the chart. Postpolypectomy ulcer with bleed clipped yesterday at colonoscopy. I agree with the Advanced Practitioner's note, impression and recommendations.   Lucio Edward, MD [facility] [phone] Mon-Fri 8a-5p [phone] after 5p, weekends, holidays

## 2016-07-15 NOTE — Plan of Care (Signed)
Problem: Nutrition: Goal: Adequate nutrition will be maintained Outcome: Adequate for Discharge Increasing oral intake today

## 2016-07-15 NOTE — Discharge Summary (Signed)
[facility] Discharge Summary covering for Dr. Adriana Mccallum  Name: Patricia Hudson MRN: [id number] DOB: 11-Dec-1965 50 y.o. PCP:  No PCP Per Patient  Date of Admission: 07/13/2016  8:29 PM Date of Discharge: 07/15/2016 Attending Physician: Carol Ada, MD  Discharge Diagnosis: Active Problems:   GI bleed  Consultations: Treatment Team:  Carol Ada, MD Ladene Artist, MD  Procedures Performed:  Ct Chest W Contrast  Result Date: 07/10/2016 CLINICAL DATA:  New diagnosis of colon cancer. Clinical concern for pulmonary metastases. EXAM: CT CHEST WITH CONTRAST TECHNIQUE: Multidetector CT imaging of the chest was performed during intravenous contrast administration. CONTRAST:  50mL ISOVUE-300 IOPAMIDOL (ISOVUE-300) INJECTION 61% COMPARISON:  None. FINDINGS: Cardiovascular: The heart is normal in size. There is no pericardial effusion. No evidence of coronary artery disease. The great vessels are normal in caliber. Mediastinum/Nodes: No masses, pathologically enlarged lymph nodes, or other significant abnormality. Lungs/Pleura: No pulmonary mass, infiltrate, or effusion. Upper Abdomen: No acute findings. Hyperdense material is seen within the colonic lumen at the splenic flexure. Again seen are several cystic liver masses. Musculoskeletal: No chest wall mass or suspicious bone lesions identified. IMPRESSION: No CT evidence of pulmonary metastases. Electronically Signed   By: Fidela Salisbury M.D.   On: 07/10/2016 15:10   Mr Abdomen W Wo Contrast  Result Date: 07/08/2016 CLINICAL DATA:  Stage IV colon cancer.  Evaluate liver lesion. EXAM: MRI ABDOMEN WITHOUT AND WITH CONTRAST TECHNIQUE: Multiplanar multisequence MR imaging of the abdomen was performed both before and after the administration of intravenous contrast. CONTRAST:  37mL MULTIHANCE GADOBENATE DIMEGLUMINE 529 MG/ML IV SOLN COMPARISON:  CT of earlier in the day. FINDINGS: Lower chest: Normal heart size without pericardial or  pleural effusion. Hepatobiliary: Multiple bilateral hepatic cysts. A minimally complex cyst is identified within the high right hepatic lobe on image 29/ series 1103 and image 41/series 13. Within the inferior subcapsular right hepatic lobe, an approximately 7 mm lesion demonstrates suspicious characteristics. This is mildly T2 hyperintense on image 32/series 3. Demonstrates restricted diffusion image 14/series 400. Vague peripheral enhancement on image 66/ series 1102. Normal gallbladder, without biliary ductal dilatation. Pancreas: Normal, without mass or ductal dilatation. Spleen: Normal in size, without focal abnormality. Adrenals/Urinary Tract: Normal adrenal glands. Bilateral renal cysts. No hydronephrosis. Stomach/Bowel: Normal stomach and abdominal bowel loops. Vascular/Lymphatic: Normal caliber of the aorta and branch vessels. No retroperitoneal or retrocrural adenopathy. Other: No ascites.  No evidence of omental or peritoneal disease. Musculoskeletal: No acute osseous abnormality. IMPRESSION: 1. Isolated 7 mm inferior right hepatic lobe lesion is suspicious for metastasis. Other liver lesions are benign. 2. No evidence of extrahepatic metastatic disease in the abdomen. Electronically Signed   By: Abigail Miyamoto M.D.   On: 07/08/2016 09:24   Ct Abdomen Pelvis W Contrast  Result Date: 07/07/2016 CLINICAL DATA:  Melena. Newly diagnosed colonic mass by colonoscopy. EXAM: CT ABDOMEN AND PELVIS WITH CONTRAST TECHNIQUE: Multidetector CT imaging of the abdomen and pelvis was performed using the standard protocol following bolus administration of intravenous contrast. CONTRAST:  131mL ISOVUE-300 IOPAMIDOL (ISOVUE-300) INJECTION 61% COMPARISON:  None. FINDINGS: Lower chest:  No acute findings. Hepatobiliary: Several small fluid attenuation cysts are seen in both the right and left hepatic lobes. Several other tiny sub-cm lesions are too small to characterize. A 10 mm low-attenuation lesion in the inferior right  hepatic lobe on image 40/3 has higher than fluid attenuation and is indeterminate. Gallbladder is unremarkable. Pancreas: No mass, inflammatory changes, or other significant abnormality. Spleen: Within normal limits  in size and appearance. Adrenals/Urinary Tract: No masses identified. No evidence of hydronephrosis. 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 Unopacified urinary bladder is unremarkable in appearance. Stomach/Bowel: No evidence of bowel obstruction. A subtle area of wall thickening is seen involving the colon near the rectosigmoid junction, which may represent the colonic mass seen on recent colonoscopy. No other areas of bowel wall thickening seen. Vascular/Lymphatic: Several small lymph nodes are seen in the sigmoid mesocolon measuring up to 7 mm, which are considered mildly enlarged. Local lymph node metastases cannot be excluded. No other sites of lymphadenopathy identified within the pelvis or abdomen. No evidence of abdominal aortic aneurysm. Aortic atherosclerosis. Reproductive: Uterus and adnexal regions are unremarkable in appearance Other: None. Musculoskeletal:  No suspicious bone lesions identified. IMPRESSION: Subtle area of colonic wall thickening at the rectosigmoid junction, which may correspond to the colonic mass seen on recent colonoscopy. Mildly enlarged lymph nodes in the sigmoid mesocolon measuring up to 7 mm. These are suspicious for local lymph node metastases. No other sites of lymphadenopathy identified. 10 mm indeterminate low-attenuation lesion in the inferior right hepatic lobe and other tiny sub-cm hepatic lesions which are too small to characterize. Small hepatic metastases cannot be excluded. Recommend abdomen MRI without and with contrast for further evaluation. Aortic atherosclerosis. Electronically Signed   By: Earle Gell M.D.   On: 07/07/2016 16:24   GI Procedures:  Colonoscopy 8/25 by Dr. Benson Norway with ulcer seen in cecum with  visible vessel and endoclip placed.  History/Physical Exam:  See Admission H&P  Admission HPI:  Patient admitted for GI bleed after colonoscopy with polypectomy.  Patient underwent repeat bowel prep and had repeat colonoscopy on 8/25 with ulcer seen in cecum with visible vessel to which endoclip was placed.  Patient did well post-procedure with no further sign of bleeding.  She received 2 doses of IV iron but no blood products.  Hgb holding stable over the past 24 hours.    Discharge Vitals:  BP 119/83 (BP Location: Right Arm)   Pulse 79   Temp 97.8 F (36.6 C) (Oral)   Resp 17   Ht 5\' 8"  (1.727 m)   Wt 138 lb 0.1 oz (62.6 kg)   SpO2 100%   BMI 20.98 kg/m   Discharge Labs:  Results for orders placed or performed during the hospital encounter of 07/13/16 (from the past 24 hour(s))  Basic metabolic panel     Status: Abnormal   Collection Time: 07/15/16  2:55 AM  Result Value Ref Range   Sodium 142 135 - 145 mmol/L   Potassium 3.6 3.5 - 5.1 mmol/L   Chloride 115 (H) 101 - 111 mmol/L   CO2 23 22 - 32 mmol/L   Glucose, Bld 88 65 - 99 mg/dL   BUN <5 (L) 6 - 20 mg/dL   Creatinine, Ser 0.75 0.44 - 1.00 mg/dL   Calcium 8.4 (L) 8.9 - 10.3 mg/dL   GFR calc non Af Amer >60 >60 mL/min   GFR calc Af Amer >60 >60 mL/min   Anion gap 4 (L) 5 - 15  CBC     Status: Abnormal   Collection Time: 07/15/16  2:57 AM  Result Value Ref Range   WBC 3.5 (L) 4.0 - 10.5 K/uL   RBC 2.55 (L) 3.87 - 5.11 MIL/uL   Hemoglobin 7.8 (L) 12.0 - 15.0 g/dL   HCT 23.5 (L) 36.0 - 46.0 %   MCV 92.2 78.0 - 100.0 fL   MCH  30.6 26.0 - 34.0 pg   MCHC 33.2 30.0 - 36.0 g/dL   RDW 12.7 11.5 - 15.5 %   Platelets 120 (L) 150 - 400 K/uL    Disposition and follow-up:   Ms.Danicka R Stayner was discharged from [facility] in stable condition.    Follow-up Appointments: Discharge Instructions    Call MD for:    Complete by:  As directed   Call for recurrent active bleeding.   Call MD for:  extreme fatigue     Complete by:  As directed   Call MD for:  persistant dizziness or light-headedness    Complete by:  As directed   Call MD for:  persistant nausea and vomiting    Complete by:  As directed   Call MD for:  severe uncontrolled pain    Complete by:  As directed   Call MD for:  temperature >100.4    Complete by:  As directed   Discharge instructions    Complete by:  As directed   Soft/low fiber diet for the next week.   Labs being performed by Dr. Benay Spice on 9/1 at which time CBC can be repeated.   Increase activity slowly    Complete by:  As directed      Discharge Medications:   Medication List    STOP taking these medications   ibuprofen 200 MG tablet Commonly known as:  ADVIL,MOTRIN     TAKE these medications   acetaminophen 325 MG tablet Commonly known as:  TYLENOL Take 650 mg by mouth daily as needed for mild pain or headache.      SignedMyrtice Lauth, Makila Colombe D. 07/15/2016, 10:32 AM

## 2016-07-15 NOTE — Progress Notes (Signed)
Discussed discharge instructions with patient and her husband, patient and husband voiced understanding of instructions including diet, call for any bleeding, temperature >100.4, pain, and return appointment.  All patient belongings gathered and taken to car by husband.

## 2016-07-17 ENCOUNTER — Encounter (HOSPITAL_COMMUNITY): Payer: Self-pay | Admitting: Gastroenterology

## 2016-07-21 ENCOUNTER — Encounter (HOSPITAL_COMMUNITY)
Admission: RE | Admit: 2016-07-21 | Discharge: 2016-07-21 | Disposition: A | Discharge status: Home / Self Care | Payer: No Typology Code available for payment source | Source: Ambulatory Visit | Attending: General Surgery | Admitting: General Surgery

## 2016-07-21 ENCOUNTER — Other Ambulatory Visit: Payer: PRIVATE HEALTH INSURANCE

## 2016-07-21 ENCOUNTER — Ambulatory Visit (HOSPITAL_COMMUNITY)
Admit: 2016-07-21 | Discharge: 2016-07-21 | Disposition: A | Discharge status: Home / Self Care | Payer: No Typology Code available for payment source | Attending: Oncology | Admitting: Oncology

## 2016-07-21 ENCOUNTER — Encounter: Payer: Self-pay | Admitting: Nurse Practitioner

## 2016-07-21 ENCOUNTER — Encounter: Payer: PRIVATE HEALTH INSURANCE | Admitting: Nutrition

## 2016-07-21 DIAGNOSIS — C187 Malignant neoplasm of sigmoid colon: Secondary | ICD-10-CM | POA: Insufficient documentation

## 2016-07-21 DIAGNOSIS — K769 Liver disease, unspecified: Secondary | ICD-10-CM | POA: Insufficient documentation

## 2016-07-21 HISTORY — DX: Malignant (primary) neoplasm, unspecified: C80.1

## 2016-07-21 LAB — GLUCOSE, CAPILLARY: GLUCOSE-CAPILLARY: 107 mg/dL — AB (ref 65–99)

## 2016-07-21 MED ORDER — FLUDEOXYGLUCOSE F - 18 (FDG) INJECTION
6.9500 | Freq: Once | INTRAVENOUS | Status: AC | PRN
  Administered 2016-07-21: 6.95 via INTRAVENOUS

## 2016-07-21 NOTE — Progress Notes (Signed)
Pt stated cannot wait for labs, has to pick up son at 300 pm.

## 2016-07-21 NOTE — Patient Instructions (Signed)
Patricia Hudson  07/21/2016   Your procedure is scheduled on: 07-27-16  Report to [facility] Main  Entrance take [facility]  elevators to 3rd floor to  [name] at 915 AM.  Call this number if you have problems the morning of surgery [phone]   Remember: ONLY 1 PERSON MAY GO WITH YOU TO SHORT STAY TO GET  READY MORNING OF [place].  Do not eat food or drink liquids :After Midnight.     Take these medicines the morning of surgery with A SIP OF WATER: none                               You may not have any metal on your body including hair pins and              piercings  Do not wear jewelry, make-up, lotions, powders or perfumes, deodorant             Do not wear nail polish.  Do not shave  48 hours prior to surgery.              Men may shave face and neck.   Do not bring valuables to the hospital. [name].  Contacts, dentures or bridgework may not be worn into surgery.  Leave suitcase in the car. After surgery it may be brought to your room.     Patients discharged the day of surgery will not be allowed to drive home.               Please read over the following fact sheets you were given: _____________________________________________________________________             [facility] - Preparing for Surgery Before surgery, you can play an important role.  Because skin is not sterile, your skin needs to be as free of germs as possible.  You can reduce the number of germs on your skin by washing with CHG (chlorahexidine gluconate) soap before surgery.  CHG is an antiseptic cleaner which kills germs and bonds with the skin to continue killing germs even after washing. Please DO NOT use if you have an allergy to CHG or antibacterial soaps.  If your skin becomes reddened/irritated stop using the CHG and inform your nurse when you arrive at Short Stay. Do not shave (including legs and underarms) for at  least 48 hours prior to the first CHG shower.  You may shave your face/neck. Please follow these instructions carefully:  1.  Shower with CHG Soap the night before surgery and the  morning of Surgery.  2.  If you choose to wash your hair, wash your hair first as usual with your  normal  shampoo.  3.  After you shampoo, rinse your hair and body thoroughly to remove the  shampoo.                           4.  Use CHG as you would any other liquid soap.  You can apply chg directly  to the skin and wash                       Gently  with a scrungie or clean washcloth.  5.  Apply the CHG Soap to your body ONLY FROM THE NECK DOWN.   Do not use on face/ open                           Wound or open sores. Avoid contact with eyes, ears mouth and genitals (private parts).                       Wash face,  Genitals (private parts) with your normal soap.             6.  Wash thoroughly, paying special attention to the area where your surgery  will be performed.  7.  Thoroughly rinse your body with warm water from the neck down.  8.  DO NOT shower/wash with your normal soap after using and rinsing off  the CHG Soap.                9.  Pat yourself dry with a clean towel.            10.  Wear clean pajamas.            11.  Place clean sheets on your bed the night of your first shower and do not  sleep with pets. Day of Surgery : Do not apply any lotions/deodorants the morning of surgery.  Please wear clean clothes to the hospital/surgery center.  FAILURE TO FOLLOW THESE INSTRUCTIONS MAY RESULT IN THE CANCELLATION OF YOUR SURGERY PATIENT SIGNATURE_________________________________  NURSE SIGNATURE__________________________________  ________________________________________________________________________

## 2016-07-21 NOTE — Progress Notes (Addendum)
messgae left with dr Lucianne Muss nurse April  nurse have cbc drawn 07-20-16 dr Collene Mares and bmet 07-15-16 epic, can we use those for 07-27-16 surgery?

## 2016-07-21 NOTE — Progress Notes (Signed)
Chest ct 07-10-16 epic

## 2016-07-21 NOTE — Progress Notes (Signed)
Ferritin, pt, ptt, cbc iron cea 07-20-16 dr Collene Mares on chart

## 2016-07-25 ENCOUNTER — Telehealth: Payer: Self-pay | Admitting: *Deleted

## 2016-07-25 ENCOUNTER — Encounter: Payer: Self-pay | Admitting: Oncology

## 2016-07-25 NOTE — Telephone Encounter (Signed)
  Oncology Nurse Navigator Documentation  Navigator Location: [facility] (07/25/16 0956) Navigator Encounter Type: Telephone (07/25/16 [id number]) Telephone: Jerilee Hoh Confirmation/Clarification (07/25/16 [id number])   Called patient with follow up appointment with Dr. Benay Spice: requests late afternoon for her husband to get finished with work. Scheduled for 08/07/16 at 3:15 pm.

## 2016-07-25 NOTE — Progress Notes (Signed)
completed by dr Benay Spice I faxed [phone] to [facility]-not sure when left with him

## 2016-07-25 NOTE — Progress Notes (Signed)
Spoke with April dr rosenbower's nurse, can use bmet 07-15-16 epic for 9-7-surgery, but get cbc with dif and type and screen day of surgery per dr Zella Richer.

## 2016-07-27 ENCOUNTER — Encounter (HOSPITAL_COMMUNITY): Payer: Self-pay | Admitting: *Deleted

## 2016-07-27 ENCOUNTER — Inpatient Hospital Stay (HOSPITAL_COMMUNITY)
Admission: RE | Admit: 2016-07-27 | Discharge: 2016-08-01 | DRG: 330 | Disposition: A | Discharge status: Home / Self Care | Payer: No Typology Code available for payment source | Source: Ambulatory Visit | Attending: General Surgery | Admitting: General Surgery

## 2016-07-27 ENCOUNTER — Inpatient Hospital Stay (HOSPITAL_COMMUNITY): Payer: No Typology Code available for payment source | Admitting: Anesthesiology

## 2016-07-27 ENCOUNTER — Encounter (HOSPITAL_COMMUNITY)
Admission: RE | Disposition: A | Discharge status: Home / Self Care | Payer: Self-pay | Source: Ambulatory Visit | Attending: General Surgery

## 2016-07-27 DIAGNOSIS — R160 Hepatomegaly, not elsewhere classified: Secondary | ICD-10-CM | POA: Diagnosis present

## 2016-07-27 DIAGNOSIS — C189 Malignant neoplasm of colon, unspecified: Secondary | ICD-10-CM | POA: Diagnosis present

## 2016-07-27 DIAGNOSIS — D62 Acute posthemorrhagic anemia: Secondary | ICD-10-CM | POA: Diagnosis not present

## 2016-07-27 DIAGNOSIS — C772 Secondary and unspecified malignant neoplasm of intra-abdominal lymph nodes: Secondary | ICD-10-CM | POA: Diagnosis present

## 2016-07-27 DIAGNOSIS — K769 Liver disease, unspecified: Secondary | ICD-10-CM | POA: Diagnosis present

## 2016-07-27 DIAGNOSIS — C187 Malignant neoplasm of sigmoid colon: Secondary | ICD-10-CM | POA: Diagnosis present

## 2016-07-27 DIAGNOSIS — K567 Ileus, unspecified: Secondary | ICD-10-CM | POA: Diagnosis not present

## 2016-07-27 DIAGNOSIS — Z9049 Acquired absence of other specified parts of digestive tract: Secondary | ICD-10-CM

## 2016-07-27 HISTORY — PX: LAPAROSCOPIC PARTIAL COLECTOMY: SHX5907

## 2016-07-27 HISTORY — PX: LAPAROSCOPIC LIVER CYST REMOVAL: SHX5900

## 2016-07-27 LAB — CBC WITH DIFFERENTIAL/PLATELET
BASOS ABS: 0 10*3/uL (ref 0.0–0.1)
Basophils Relative: 1 %
Eosinophils Absolute: 0.1 10*3/uL (ref 0.0–0.7)
Eosinophils Relative: 2 %
HEMATOCRIT: 34.7 % — AB (ref 36.0–46.0)
Hemoglobin: 11.5 g/dL — ABNORMAL LOW (ref 12.0–15.0)
LYMPHS PCT: 20 %
Lymphs Abs: 0.9 10*3/uL (ref 0.7–4.0)
MCH: 31.3 pg (ref 26.0–34.0)
MCHC: 33.1 g/dL (ref 30.0–36.0)
MCV: 94.3 fL (ref 78.0–100.0)
MONO ABS: 0.5 10*3/uL (ref 0.1–1.0)
MONOS PCT: 12 %
NEUTROS ABS: 2.8 10*3/uL (ref 1.7–7.7)
Neutrophils Relative %: 65 %
Platelets: 232 10*3/uL (ref 150–400)
RBC: 3.68 MIL/uL — ABNORMAL LOW (ref 3.87–5.11)
RDW: 14.6 % (ref 11.5–15.5)
WBC: 4.3 10*3/uL (ref 4.0–10.5)

## 2016-07-27 LAB — MRSA PCR SCREENING: MRSA BY PCR: NEGATIVE

## 2016-07-27 LAB — CREATININE, SERUM
CREATININE: 0.82 mg/dL (ref 0.44–1.00)
GFR calc non Af Amer: >60 mL/min (ref >60)

## 2016-07-27 LAB — TYPE AND SCREEN
ABO/RH(D): O NEG
Antibody Screen: NEGATIVE

## 2016-07-27 LAB — HEMOGLOBIN AND HEMATOCRIT, BLOOD
HCT: 25 % — ABNORMAL LOW (ref 36.0–46.0)
HEMOGLOBIN: 8.5 g/dL — AB (ref 12.0–15.0)

## 2016-07-27 LAB — ABO/RH: ABO/RH(D): O NEG

## 2016-07-27 SURGERY — LAPAROSCOPIC PARTIAL COLECTOMY
Anesthesia: General | Site: Abdomen

## 2016-07-27 MED ORDER — SODIUM CHLORIDE 0.9 % IJ SOLN
INTRAMUSCULAR | Status: AC
  Filled 2016-07-27: qty 10

## 2016-07-27 MED ORDER — CEFOTETAN DISODIUM-DEXTROSE 2-2.08 GM-% IV SOLR
2.0000 g | INTRAVENOUS | Status: AC
  Administered 2016-07-27 (×2): 2 g via INTRAVENOUS

## 2016-07-27 MED ORDER — BUPIVACAINE HCL (PF) 0.5 % IJ SOLN
INTRAMUSCULAR | Status: DC | PRN
  Administered 2016-07-27: 20 mL

## 2016-07-27 MED ORDER — CEFOTETAN DISODIUM-DEXTROSE 2-2.08 GM-% IV SOLR
INTRAVENOUS | Status: AC
  Filled 2016-07-27: qty 50

## 2016-07-27 MED ORDER — HYDROMORPHONE HCL 1 MG/ML IJ SOLN
0.2500 mg | INTRAMUSCULAR | Status: DC | PRN
  Administered 2016-07-27 (×3): 0.5 mg via INTRAVENOUS

## 2016-07-27 MED ORDER — PROMETHAZINE HCL 25 MG/ML IJ SOLN
6.2500 mg | INTRAMUSCULAR | Status: DC | PRN
  Administered 2016-07-27: 6.25 mg via INTRAVENOUS

## 2016-07-27 MED ORDER — DEXTROSE-NACL 5-0.9 % IV SOLN
INTRAVENOUS | Status: DC
  Administered 2016-07-27: 20:00:00 via INTRAVENOUS

## 2016-07-27 MED ORDER — ONDANSETRON HCL 4 MG PO TABS: 4.0000 mg | ORAL_TABLET | Freq: Four times a day (QID) | ORAL | Status: DC | PRN

## 2016-07-27 MED ORDER — SUFENTANIL CITRATE 50 MCG/ML IV SOLN
INTRAVENOUS | Status: AC
  Filled 2016-07-27: qty 1

## 2016-07-27 MED ORDER — PHENYLEPHRINE HCL 10 MG/ML IJ SOLN
INTRAMUSCULAR | Status: DC | PRN
  Administered 2016-07-27: 160 ug via INTRAVENOUS
  Administered 2016-07-27 (×2): 80 ug via INTRAVENOUS
  Administered 2016-07-27: 160 ug via INTRAVENOUS

## 2016-07-27 MED ORDER — ALBUMIN HUMAN 5 % IV SOLN
INTRAVENOUS | Status: DC | PRN
  Administered 2016-07-27: 17:00:00 via INTRAVENOUS

## 2016-07-27 MED ORDER — HYDROMORPHONE HCL 2 MG/ML IJ SOLN
INTRAMUSCULAR | Status: AC
  Filled 2016-07-27: qty 1

## 2016-07-27 MED ORDER — CHLORHEXIDINE GLUCONATE CLOTH 2 % EX PADS: 6.0000 | MEDICATED_PAD | Freq: Once | CUTANEOUS | Status: DC

## 2016-07-27 MED ORDER — ONDANSETRON HCL 4 MG/2ML IJ SOLN
INTRAMUSCULAR | Status: DC | PRN
  Administered 2016-07-27: 4 mg via INTRAVENOUS

## 2016-07-27 MED ORDER — ROCURONIUM BROMIDE 100 MG/10ML IV SOLN
INTRAVENOUS | Status: AC
  Filled 2016-07-27: qty 1

## 2016-07-27 MED ORDER — PHENYLEPHRINE HCL 10 MG/ML IJ SOLN
INTRAMUSCULAR | Status: AC
  Filled 2016-07-27: qty 1

## 2016-07-27 MED ORDER — MIDAZOLAM HCL 2 MG/2ML IJ SOLN
INTRAMUSCULAR | Status: AC
  Filled 2016-07-27: qty 2

## 2016-07-27 MED ORDER — SUGAMMADEX SODIUM 200 MG/2ML IV SOLN
INTRAVENOUS | Status: AC
  Filled 2016-07-27: qty 2

## 2016-07-27 MED ORDER — LACTATED RINGERS IR SOLN
Status: DC | PRN
  Administered 2016-07-27: 1000 mL

## 2016-07-27 MED ORDER — PROPOFOL 10 MG/ML IV BOLUS
INTRAVENOUS | Status: DC | PRN
  Administered 2016-07-27: 200 mg via INTRAVENOUS
  Administered 2016-07-27 (×3): 50 mg via INTRAVENOUS

## 2016-07-27 MED ORDER — FAMOTIDINE IN NACL 20-0.9 MG/50ML-% IV SOLN
20.0000 mg | Freq: Two times a day (BID) | INTRAVENOUS | Status: DC
  Administered 2016-07-28 – 2016-07-29 (×5): 20 mg via INTRAVENOUS
  Filled 2016-07-27 (×5): qty 50

## 2016-07-27 MED ORDER — ONDANSETRON HCL 4 MG/2ML IJ SOLN: 4.0000 mg | INTRAMUSCULAR | Status: DC | PRN

## 2016-07-27 MED ORDER — BUPIVACAINE HCL (PF) 0.5 % IJ SOLN
INTRAMUSCULAR | Status: AC
  Filled 2016-07-27: qty 30

## 2016-07-27 MED ORDER — HEPARIN SODIUM (PORCINE) 5000 UNIT/ML IJ SOLN
5000.0000 [IU] | Freq: Three times a day (TID) | INTRAMUSCULAR | Status: DC
  Administered 2016-07-28 – 2016-08-01 (×12): 5000 [IU] via SUBCUTANEOUS
  Filled 2016-07-27 (×12): qty 1

## 2016-07-27 MED ORDER — ACETAMINOPHEN 10 MG/ML IV SOLN
INTRAVENOUS | Status: DC | PRN
  Administered 2016-07-27: 1000 mg via INTRAVENOUS

## 2016-07-27 MED ORDER — MORPHINE SULFATE 2 MG/ML IV SOLN
INTRAVENOUS | Status: DC
  Administered 2016-07-27: 18:00:00 via INTRAVENOUS
  Administered 2016-07-28: 9 mg via INTRAVENOUS
  Administered 2016-07-28: 4 mg via INTRAVENOUS
  Administered 2016-07-28: 8 mg via INTRAVENOUS
  Administered 2016-07-28: 2 mg via INTRAVENOUS
  Administered 2016-07-28: 11 mg via INTRAVENOUS
  Administered 2016-07-28: 12 mg via INTRAVENOUS
  Administered 2016-07-29: 6 mg via INTRAVENOUS
  Administered 2016-07-29 (×2): 15 mg via INTRAVENOUS
  Filled 2016-07-27 (×2): qty 25

## 2016-07-27 MED ORDER — DIPHENHYDRAMINE HCL 50 MG/ML IJ SOLN: 12.5000 mg | Freq: Four times a day (QID) | INTRAMUSCULAR | Status: DC | PRN

## 2016-07-27 MED ORDER — ALVIMOPAN 12 MG PO CAPS
12.0000 mg | ORAL_CAPSULE | Freq: Once | ORAL | Status: AC
  Administered 2016-07-27: 12 mg via ORAL
  Filled 2016-07-27: qty 1

## 2016-07-27 MED ORDER — DEXTROSE 5 % IV SOLN
2.0000 g | Freq: Two times a day (BID) | INTRAVENOUS | Status: AC
  Administered 2016-07-28: 2 g via INTRAVENOUS
  Filled 2016-07-27: qty 2

## 2016-07-27 MED ORDER — ALVIMOPAN 12 MG PO CAPS
12.0000 mg | ORAL_CAPSULE | Freq: Two times a day (BID) | ORAL | Status: DC
  Administered 2016-07-28 – 2016-07-31 (×8): 12 mg via ORAL
  Filled 2016-07-27 (×8): qty 1

## 2016-07-27 MED ORDER — ONDANSETRON HCL 4 MG/2ML IJ SOLN
INTRAMUSCULAR | Status: AC
  Filled 2016-07-27: qty 2

## 2016-07-27 MED ORDER — NALOXONE HCL 0.4 MG/ML IJ SOLN: 0.4000 mg | INTRAMUSCULAR | Status: DC | PRN

## 2016-07-27 MED ORDER — HYDROMORPHONE HCL 1 MG/ML IJ SOLN
INTRAMUSCULAR | Status: DC | PRN
  Administered 2016-07-27 (×2): 1 mg via INTRAVENOUS

## 2016-07-27 MED ORDER — SUFENTANIL CITRATE 50 MCG/ML IV SOLN
INTRAVENOUS | Status: DC | PRN
  Administered 2016-07-27: 5 ug via INTRAVENOUS
  Administered 2016-07-27 (×2): 10 ug via INTRAVENOUS
  Administered 2016-07-27 (×2): 5 ug via INTRAVENOUS
  Administered 2016-07-27: 10 ug via INTRAVENOUS
  Administered 2016-07-27 (×2): 5 ug via INTRAVENOUS
  Administered 2016-07-27: 10 ug via INTRAVENOUS
  Administered 2016-07-27: 5 ug via INTRAVENOUS
  Administered 2016-07-27: 10 ug via INTRAVENOUS
  Administered 2016-07-27: 5 ug via INTRAVENOUS
  Administered 2016-07-27: 10 ug via INTRAVENOUS
  Administered 2016-07-27: 5 ug via INTRAVENOUS

## 2016-07-27 MED ORDER — DIPHENHYDRAMINE HCL 12.5 MG/5ML PO ELIX: 12.5000 mg | ORAL_SOLUTION | Freq: Four times a day (QID) | ORAL | Status: DC | PRN

## 2016-07-27 MED ORDER — HYDROMORPHONE HCL 1 MG/ML IJ SOLN
INTRAMUSCULAR | Status: AC
  Administered 2016-07-27: 0.5 mg via INTRAVENOUS
  Filled 2016-07-27: qty 1

## 2016-07-27 MED ORDER — SODIUM CHLORIDE 0.9% FLUSH: 9.0000 mL | INTRAVENOUS | Status: DC | PRN

## 2016-07-27 MED ORDER — DEXAMETHASONE SODIUM PHOSPHATE 10 MG/ML IJ SOLN
INTRAMUSCULAR | Status: DC | PRN
  Administered 2016-07-27: 10 mg via INTRAVENOUS

## 2016-07-27 MED ORDER — HYDROMORPHONE HCL 1 MG/ML IJ SOLN
INTRAMUSCULAR | Status: AC
  Filled 2016-07-27: qty 1

## 2016-07-27 MED ORDER — LACTATED RINGERS IV SOLN
INTRAVENOUS | Status: DC | PRN
  Administered 2016-07-27 (×6): via INTRAVENOUS

## 2016-07-27 MED ORDER — PROMETHAZINE HCL 25 MG/ML IJ SOLN
INTRAMUSCULAR | Status: AC
  Filled 2016-07-27: qty 1

## 2016-07-27 MED ORDER — DEXAMETHASONE SODIUM PHOSPHATE 10 MG/ML IJ SOLN
INTRAMUSCULAR | Status: AC
  Filled 2016-07-27: qty 1

## 2016-07-27 MED ORDER — 0.9 % SODIUM CHLORIDE (POUR BTL) OPTIME
TOPICAL | Status: DC | PRN
  Administered 2016-07-27: 4000 mL

## 2016-07-27 MED ORDER — PHENYLEPHRINE 40 MCG/ML (10ML) SYRINGE FOR IV PUSH (FOR BLOOD PRESSURE SUPPORT)
PREFILLED_SYRINGE | INTRAVENOUS | Status: AC
  Filled 2016-07-27: qty 10

## 2016-07-27 MED ORDER — ONDANSETRON HCL 4 MG/2ML IJ SOLN
4.0000 mg | Freq: Four times a day (QID) | INTRAMUSCULAR | Status: DC | PRN
Start: 2016-07-27 — End: 2016-07-27

## 2016-07-27 MED ORDER — PHENYLEPHRINE HCL 10 MG/ML IJ SOLN
INTRAVENOUS | Status: DC | PRN
  Administered 2016-07-27: 30 ug/min via INTRAVENOUS

## 2016-07-27 MED ORDER — MIDAZOLAM HCL 5 MG/5ML IJ SOLN
INTRAMUSCULAR | Status: DC | PRN
  Administered 2016-07-27 (×2): 1 mg via INTRAVENOUS
  Administered 2016-07-27: 2 mg via INTRAVENOUS

## 2016-07-27 MED ORDER — LIDOCAINE HCL (CARDIAC) 20 MG/ML IV SOLN
INTRAVENOUS | Status: DC | PRN
  Administered 2016-07-27: 100 mg via INTRAVENOUS

## 2016-07-27 MED ORDER — ORAL CARE MOUTH RINSE
15.0000 mL | Freq: Two times a day (BID) | OROMUCOSAL | Status: DC
  Administered 2016-07-28 – 2016-07-31 (×3): 15 mL via OROMUCOSAL

## 2016-07-27 MED ORDER — ROCURONIUM BROMIDE 100 MG/10ML IV SOLN
INTRAVENOUS | Status: DC | PRN
  Administered 2016-07-27: 70 mg via INTRAVENOUS
  Administered 2016-07-27: 5 mg via INTRAVENOUS
  Administered 2016-07-27: 30 mg via INTRAVENOUS
  Administered 2016-07-27: 20 mg via INTRAVENOUS
  Administered 2016-07-27: 30 mg via INTRAVENOUS
  Administered 2016-07-27: 20 mg via INTRAVENOUS

## 2016-07-27 MED ORDER — TISSEEL VH 10 ML EX KIT
PACK | CUTANEOUS | Status: AC
  Filled 2016-07-27: qty 1

## 2016-07-27 MED ORDER — PROPOFOL 10 MG/ML IV BOLUS
INTRAVENOUS | Status: AC
  Filled 2016-07-27: qty 40

## 2016-07-27 MED ORDER — LIDOCAINE 2% (20 MG/ML) 5 ML SYRINGE
INTRAMUSCULAR | Status: AC
  Filled 2016-07-27: qty 5

## 2016-07-27 MED ORDER — SUGAMMADEX SODIUM 200 MG/2ML IV SOLN
INTRAVENOUS | Status: DC | PRN
  Administered 2016-07-27: 150 mg via INTRAVENOUS

## 2016-07-27 MED ORDER — LACTATED RINGERS IV SOLN
INTRAVENOUS | Status: DC | PRN
  Administered 2016-07-27: 11:00:00 via INTRAVENOUS

## 2016-07-27 SURGICAL SUPPLY — 94 items
APPLIER CLIP 5 13 M/L LIGAMAX5 (MISCELLANEOUS)
APPLIER CLIP ROT 10 11.4 M/L (STAPLE)
BAG BILE SMALL  12/CS (BAG) ×3 IMPLANT
BLADE EXTENDED COATED 6.5IN (ELECTRODE) ×3 IMPLANT
CABLE HIGH FREQUENCY MONO STRZ (ELECTRODE) ×3 IMPLANT
CELLS DAT CNTRL 66122 CELL SVR (MISCELLANEOUS) IMPLANT
CHLORAPREP W/TINT 26ML (MISCELLANEOUS) IMPLANT
CLIP APPLIE 5 13 M/L LIGAMAX5 (MISCELLANEOUS) IMPLANT
CLIP APPLIE ROT 10 11.4 M/L (STAPLE) IMPLANT
COUNTER NEEDLE 20 DBL MAG RED (NEEDLE) ×3 IMPLANT
COVER MAYO STAND STRL (DRAPES) IMPLANT
COVER SURGICAL LIGHT HANDLE (MISCELLANEOUS) ×6 IMPLANT
DECANTER SPIKE VIAL GLASS SM (MISCELLANEOUS) ×3 IMPLANT
DISSECTOR BLUNT TIP ENDO 5MM (MISCELLANEOUS) ×3 IMPLANT
DRAIN CHANNEL 19F RND (DRAIN) ×3 IMPLANT
DRAPE LAPAROSCOPIC ABDOMINAL (DRAPES) ×3 IMPLANT
DRAPE SHEET LG 3/4 BI-LAMINATE (DRAPES) ×3 IMPLANT
DRAPE SURG IRRIG POUCH 19X23 (DRAPES) ×3 IMPLANT
DRAPE UTILITY XL STRL (DRAPES) IMPLANT
DRSG OPSITE POSTOP 4X10 (GAUZE/BANDAGES/DRESSINGS) IMPLANT
DRSG OPSITE POSTOP 4X6 (GAUZE/BANDAGES/DRESSINGS) ×3 IMPLANT
DRSG OPSITE POSTOP 4X8 (GAUZE/BANDAGES/DRESSINGS) IMPLANT
DRSG TEGADERM 2-3/8X2-3/4 SM (GAUZE/BANDAGES/DRESSINGS) IMPLANT
ELECT PENCIL ROCKER SW 15FT (MISCELLANEOUS) IMPLANT
ELECT REM PT RETURN 15FT ADLT (MISCELLANEOUS) ×3 IMPLANT
EVACUATOR SILICONE 100CC (DRAIN) ×3 IMPLANT
FILTER SMOKE EVAC LAPAROSHD (FILTER) IMPLANT
GAUZE SPONGE 2X2 8PLY STRL LF (GAUZE/BANDAGES/DRESSINGS) ×2 IMPLANT
GAUZE SPONGE 4X4 12PLY STRL (GAUZE/BANDAGES/DRESSINGS) ×3 IMPLANT
GLOVE ECLIPSE 8.0 STRL XLNG CF (GLOVE) ×6 IMPLANT
GLOVE INDICATOR 8.0 STRL GRN (GLOVE) ×6 IMPLANT
GOWN STRL REUS W/TWL XL LVL3 (GOWN DISPOSABLE) ×24 IMPLANT
HANDLE SUCTION POOLE (INSTRUMENTS) ×2 IMPLANT
IRRIG SUCT STRYKERFLOW 2 WTIP (MISCELLANEOUS) ×3
IRRIGATION SUCT STRKRFLW 2 WTP (MISCELLANEOUS) ×2 IMPLANT
L-HOOK LAP DISP 36CM (ELECTROSURGICAL) ×3
LEGGING LITHOTOMY PAIR STRL (DRAPES) ×3 IMPLANT
LHOOK LAP DISP 36CM (ELECTROSURGICAL) ×2 IMPLANT
LIGASURE IMPACT 36 18CM CVD LR (INSTRUMENTS) ×3 IMPLANT
MANIFOLD NEPTUNE II (INSTRUMENTS) ×6 IMPLANT
PACK COLON (CUSTOM PROCEDURE TRAY) ×3 IMPLANT
PAD POSITIONING PINK XL (MISCELLANEOUS) ×3 IMPLANT
PORT LAP GEL ALEXIS MED 5-9CM (MISCELLANEOUS) IMPLANT
POSITIONER SURGICAL ARM (MISCELLANEOUS) ×6 IMPLANT
POUCH SPECIMEN RETRIEVAL 10MM (ENDOMECHANICALS) ×3 IMPLANT
RELOAD PROXIMATE 75MM BLUE (ENDOMECHANICALS) ×3 IMPLANT
RELOAD STAPLER WHITE 60MM (STAPLE) ×10 IMPLANT
RTRCTR WOUND ALEXIS 18CM MED (MISCELLANEOUS)
SCISSORS LAP 5X35 DISP (ENDOMECHANICALS) ×3 IMPLANT
SEALANT SURGICAL APPL DUAL CAN (MISCELLANEOUS) ×3 IMPLANT
SHEARS HARMONIC ACE PLUS 36CM (ENDOMECHANICALS) IMPLANT
SHEARS HARMONIC ACE PLUS 45CM (MISCELLANEOUS) IMPLANT
SLEEVE XCEL OPT CAN 5 100 (ENDOMECHANICALS) ×9 IMPLANT
SPONGE DRAIN TRACH 4X4 STRL 2S (GAUZE/BANDAGES/DRESSINGS) ×3 IMPLANT
SPONGE GAUZE 2X2 STER 10/PKG (GAUZE/BANDAGES/DRESSINGS) ×1
SPONGE LAP 18X18 X RAY DECT (DISPOSABLE) ×9 IMPLANT
STAPLE ECHEON FLEX 60 POW ENDO (STAPLE) ×3 IMPLANT
STAPLER CIRC CVD 29MM 37CM (STAPLE) ×3 IMPLANT
STAPLER CUT CVD 40MM BLUE (STAPLE) ×3 IMPLANT
STAPLER PROXIMATE 75MM BLUE (STAPLE) ×3 IMPLANT
STAPLER RELOAD WHITE 60MM (STAPLE) ×15
STAPLER VISISTAT 35W (STAPLE) IMPLANT
SUCTION POOLE HANDLE (INSTRUMENTS) ×3
SUT ETHILON 2 0 PS N (SUTURE) ×6 IMPLANT
SUT ETHILON 3 0 PS 1 (SUTURE) IMPLANT
SUT MNCRL AB 4-0 PS2 18 (SUTURE) ×3 IMPLANT
SUT PDS AB 1 CTX 36 (SUTURE) ×6 IMPLANT
SUT PDS AB 1 TP1 96 (SUTURE) IMPLANT
SUT PROLENE 2 0 KS (SUTURE) IMPLANT
SUT PROLENE 2 0 SH DA (SUTURE) IMPLANT
SUT SILK 2 0 (SUTURE) ×1
SUT SILK 2 0 SH CR/8 (SUTURE) ×3 IMPLANT
SUT SILK 2-0 18XBRD TIE 12 (SUTURE) ×2 IMPLANT
SUT SILK 3 0 (SUTURE) ×1
SUT SILK 3 0 SH CR/8 (SUTURE) ×3 IMPLANT
SUT SILK 3-0 18XBRD TIE 12 (SUTURE) ×2 IMPLANT
SUT VIC AB 2-0 SH 27 (SUTURE) ×1
SUT VIC AB 2-0 SH 27X BRD (SUTURE) ×2 IMPLANT
SUT VICRYL 0 UR6 27IN ABS (SUTURE) ×3 IMPLANT
SUT VICRYL 2 0 18  UND BR (SUTURE)
SUT VICRYL 2 0 18 UND BR (SUTURE) IMPLANT
SYS LAPSCP GELPORT 120MM (MISCELLANEOUS) ×6
SYSTEM LAPSCP GELPORT 120MM (MISCELLANEOUS) ×4 IMPLANT
TAPE CLOTH 4X10 WHT NS (GAUZE/BANDAGES/DRESSINGS) ×3 IMPLANT
TAPE CLOTH SURG 4X10 WHT LF (GAUZE/BANDAGES/DRESSINGS) ×3 IMPLANT
TOWEL OR 17X26 10 PK STRL BLUE (TOWEL DISPOSABLE) IMPLANT
TOWEL OR NON WOVEN STRL DISP B (DISPOSABLE) ×3 IMPLANT
TRAY FOLEY W/METER SILVER 14FR (SET/KITS/TRAYS/PACK) ×3 IMPLANT
TRAY FOLEY W/METER SILVER 16FR (SET/KITS/TRAYS/PACK) IMPLANT
TROCAR BLADELESS OPT 5 100 (ENDOMECHANICALS) ×3 IMPLANT
TROCAR XCEL BLUNT TIP 100MML (ENDOMECHANICALS) IMPLANT
TROCAR XCEL NON-BLD 11X100MML (ENDOMECHANICALS) IMPLANT
TUBING INSUF HEATED (TUBING) ×3 IMPLANT
YANKAUER SUCT BULB TIP 10FT TU (MISCELLANEOUS) ×3 IMPLANT

## 2016-07-27 NOTE — Op Note (Addendum)
PRE-OPERATIVE DIAGNOSIS: metastatic colon cancer  POST-OPERATIVE DIAGNOSIS:  Same  PROCEDURE:  Procedure(s): Hand assisted laparoscopic partial hepatectomy  SURGEON:  Surgeon(s): Stark Klein, MD  Assistant: Jackolyn Confer, MD  ANESTHESIA:   local and general  DRAINS: (19 Fr) Blake drain(s) in the RUQ near cut surface of liver   LOCAL MEDICATIONS USED:  NONE  SPECIMEN:  Portion of liver (segment 6, previously labeled segment 8)  DISPOSITION OF SPECIMEN:  PATHOLOGY  COUNTS:  YES  DICTATION: .Dragon Dictation  PLAN OF CARE: Admit to inpatient   PATIENT DISPOSITION:  PACU - hemodynamically stable.  FINDINGS:  Posteriorly oriented 1 cm mass in inferior right liver (segment 6)  EBL: 600  PROCEDURE:    Patient was asleep with Dr. Zella Richer for sigmoid colectomy. Timeout was performed. The incision for the GelPort in the lower midline was enlarged just a little bit. The metastasis was identified by palpation in segment .  This is the location that was PET positive and positive on MRI.  The harmonic scalpel was used to divide the superficial liver parenchyma. There was a vein that was posteriorly oriented that started bleeding. A 12 mm trocar was placed in the epigastrium and a white load of the echelon stapler was used to divide this larger posterior vein. This stopped the bulk of the bleeding.  Several additional staple loads were used to divide segment 6 from the remaining liver parenchyma. The cautery was used to achieve hemostasis in addition to the argon. No additional bleeding was seen. Tisseel was placed over the cut surface of the liver. A 19 [place] Blake drain was placed in this location coming out the right lower quadrant port site. This was secured with a 3-0 nylon suture. A 4 quadrant inspection was performed that demonstrated no evidence of continued bleeding, no large pools of blood, no succus, and no bile.  Dr. Zella Richer closed the fascial incision for the  gelport and the skin incisions.    Needle, sponge, and instrument counts were correct 2. Patient was allowed to emerge from anesthesia and taken to the PACU in stable condition.

## 2016-07-27 NOTE — Anesthesia Postprocedure Evaluation (Signed)
Anesthesia Post Note  Patient: Patricia Hudson  Procedure(s) Performed: Procedure(s) (LRB): LAPAROSCOPIC PARTIAL COLECTOMY MOBILIZATION OF SPLENIC FLEXURE LAPAROSCOPIC SEGMENTAL LIVER RESECTION OF SEGMENT 8 OF LIVER (N/A) REMOVAL OF LIVER MASS (N/A)  Patient location during evaluation: PACU Anesthesia Type: General Level of consciousness: awake and alert Pain management: pain level controlled Vital Signs Assessment: post-procedure vital signs reviewed and stable Respiratory status: spontaneous breathing, nonlabored ventilation, respiratory function stable and patient connected to nasal cannula oxygen Cardiovascular status: blood pressure returned to baseline and stable Postop Assessment: no signs of nausea or vomiting Anesthetic complications: no    Last Vitals:  Vitals:   07/27/16 1826 07/27/16 1830  BP:  122/70  Pulse:  92  Resp: 10 12  Temp:      Last Pain:  Vitals:   07/27/16 1830  TempSrc:   PainSc: Asleep                 Zenaida Deed

## 2016-07-27 NOTE — Anesthesia Procedure Notes (Signed)
Procedure Name: Intubation Date/Time: 07/27/2016 11:37 AM Performed by: Lissa Morales Pre-anesthesia Checklist: Patient identified, Emergency Drugs available, Suction available and Patient being monitored Patient Re-evaluated:Patient Re-evaluated prior to inductionOxygen Delivery Method: Circle system utilized Preoxygenation: Pre-oxygenation with 100% oxygen Intubation Type: IV induction Ventilation: Mask ventilation without difficulty Grade View: Grade II Tube type: Oral Tube size: 7.5 mm Number of attempts: 1 Airway Equipment and Method: Stylet and Oral airway Placement Confirmation: ETT inserted through vocal cords under direct vision,  positive ETCO2 and breath sounds checked- equal and bilateral Secured at: 22 cm Tube secured with: Tape Dental Injury: Teeth and Oropharynx as per pre-operative assessment  Comments: Need cricoid pressure for intubation

## 2016-07-27 NOTE — Transfer of Care (Signed)
Immediate Anesthesia Transfer of Care Note  Patient: Lina Sayre  Procedure(s) Performed: Procedure(s): LAPAROSCOPIC PARTIAL COLECTOMY MOBILIZATION OF SPLENIC FLEXURE LAPAROSCOPIC SEGMENTAL LIVER RESECTION OF SEGMENT 8 OF LIVER (N/A) REMOVAL OF LIVER MASS (N/A)  Patient Location: PACU  Anesthesia Type:General  Level of Consciousness: awake, alert , oriented and patient cooperative  Airway & Oxygen Therapy: Patient Spontanous Breathing and Patient connected to face mask oxygen  Post-op Assessment: Report given to RN, Post -op Vital signs reviewed and stable and Patient moving all extremities X 4  Post vital signs: stable  Last Vitals:  Vitals:   07/27/16 0934  BP: 109/83  Pulse: (!) 105  Resp: 16  Temp: 37.5 C    Last Pain:  Vitals:   07/27/16 0934  TempSrc: Oral      Patients Stated Pain Goal: 3 ([id number])  Complications: No apparent anesthesia complications

## 2016-07-27 NOTE — Op Note (Signed)
Operative Note  Patricia Hudson female 50 y.o. 07/27/2016  PREOPERATIVE DX:  Sigmoid colon cancer with liver lesion in segment 8  POSTOPERATIVE DX:  Same  PROCEDURE:   Laparoscopic assisted partial colectomy (rectosigmoid) with mobilization of splenic flexure         Surgeon: [name]   Assistants: Armandina Gemma M.D.  Anesthesia: General endotracheal anesthesia  Indications:   This is a 50 year old female who noticed some tissue and blood streaking in her stool. Colonoscopy demonstrated a lesion in the distal sigmoid colon. Biopsy was consistent with adenocarcinoma. Preoperative imaging studies demonstrated a suspicious lesion in segment 8 of the liver. This was mildly hypermetabolic on PET scan. She now presents for partial colectomy. Dr. Barry Dienes will then follow with excision of the liver lesion.    Procedure Detail:  She was seen in the holding area. She is brought to the operating room placed supine on the operating table and a general anesthetic was given. She was placed in the lithotomy position. A Foley catheter was inserted and concentrated urine was noted. A nasogastric tube was inserted. The abdominal wall and perineal areas were then widely sterilely prepped and draped. A timeout was performed.  She is placed in slight reverse Trendelenburg position. A 5 mm incision was made in the left subcostal area. Using a 5 mm Optiview trocar and laparoscope, access was gained into the peritoneal cavity and a pneumoperitoneum created by insufflation of CO2 gas. Inspection of the area under the trocar did not demonstrate any evidence of bleeding or organ injury.  A 5 mm trocar was placed in the supraumbilical area. A 5 mm trocar was placed in the right lower quadrant. A 5 mm trocar was placed in the suprapubic area.  I inspected the liver laparoscopically and there were no obvious suspicious lesions on the anterior surface of the liver. There was what appeared to be an indented area  posteriorly around the segment 8 area.  Beginning in the mid sigmoid colon area, I divided the peritoneum of the mesentery extending this proximally and distally using the harmonic scalpel. I identified the left ureter and traced into its course into the pelvis and preserved it. I then divided the lateral attachments of the sigmoid and descending colon. Using Harmonic scalpel, I mobilized the splenic flexure and it dropped down inferiorly. I noticed a purplish area in the distal sigmoid colon consistent with the tattoo mark. I mobilized the proximal and mid rectum by dividing lateral attachments with the Harmonic scalpel  At this point, a small lower midline suprapubic incision was made through the skin, subcutaneous tissue, fascia, and peritoneum. A wound protection device was then placed. The sigmoid colon was externalized. The tumor was palpable and just proximal to the ink mark. I chose a point at the proximal third of the rectum well below the tumor. I then divided the rectum here with a linear cutting stapler. I then divided the descending colon sigmoid colon junction with linear cutting stapler. Mesenteric resection was performed and the ligation of the inferior mesenteric artery was performed as well. The proximal portion of the specimen was marked with a suture and passed off the field.  The staple line of the descending colon was removed and a size 29 EEA anvil placed in it. The colon was then closed with the linear cutting stapler. The anvil was brought out the side of the descending colon and a pursestring suture of 2-0 Prolene was used to secure it. The handle of the EEA  stapler was then passed up through the anus and rectum and an end rectum to side descending colon stapled anastomosis was performed. 2 complete donuts were noted. The distal donut was sent as the final distal margin. The air leak test was performed and there was no evidence of air leak.  Irrigation was performed and the area was  inspected. There is no evidence of organ injury or bleeding. Following this, Dr. Barry Dienes came in and performed the liver resection which will be dictated by her. Estimated blood loss for the first procedure was 200 cc. There were no apparent complications and she tolerated it well.         Drains: #19 Blake drain in the right upper quadrant. #19 Blake drain in the pelvis.                Specimens: Rectosigmoid colon.        Complications:  * No complications entered in OR log *         Disposition: PACU - hemodynamically stable.         Condition: stable

## 2016-07-27 NOTE — H&P (View-Only) (Signed)
Patricia Hudson 07/13/2016 11:36 AM Location: [facility] Patient #: [id number] DOB: 07-20-1966 Married / Language: Patricia Hudson / Race: White Female  History of Present Illness Patricia Hollingshead MD; 07/13/2016 12:46 PM) Patient words: new pt, colon & liver mass.  The patient is a 50 year old female.   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 She was in her normal state of health. Approximately 5 weeks ago she noted some tissue and little blood streaking in her stool. No abdominal pain. No weight loss. No other symptoms. She went for colonoscopy and was found to have a moderate sized lesion at 20 cm from the sigmoid colon as well as some other small polyps throughout the colon. All the other polyps were benign. The larger lesion demonstrated findings consistent with moderately differentiated adenocarcinoma. A chest/abdomen/pelvis CT scan was done which demonstrates an area of irregularity in the sigmoid colon as well as some slightly enlarged lymph nodes surrounding this. There are also cysts in the liver and a 10 mm lesion in the right inferior lobe of the liver. No lung lesions. MRI of the liver and abdomen demonstrated findings that made the lesion suspicious for malignancy but not diagnostic. She was presented at GI cancer conference yesterday. She is due to see Dr. Benay Hudson this afternoon. Her husband is here with her. There is no family history of colon cancer but there are other cancers in her family including breast cancer and pancreatic cancer.  Other Problems (April Staton, [place]; 07/13/2016 11:37 AM) Colon Cancer  Past Surgical History (April Staton, [place]; 07/13/2016 11:37 AM) Colon Polyp Removal - Colonoscopy  Diagnostic Studies History (April Staton, [place]; 07/13/2016 11:37 AM) Colonoscopy within last year Mammogram within last year Pap Smear 1-5 years  ago  Allergies (April Staton, CMA; 07/13/2016 11:38 AM) Sulfa Antibiotics  Medication History (April Staton, CMA; 07/13/2016 11:38 AM) No Current Medications Medications Reconciled  Social History (April Staton, CMA; 07/13/2016 11:37 AM) Alcohol use Occasional alcohol use. Caffeine use Carbonated beverages, Tea. No drug use Tobacco use Never smoker.  Family History (April Staton, [place]; 07/13/2016 11:37 AM) Breast Cancer Mother. Cancer Father. Colon Polyps Brother. Diabetes Mellitus Father. Heart Disease Father. Hypertension Father. Respiratory Condition Father.  Pregnancy / Birth History (April Staton, [place]; 07/13/2016 11:37 AM) Age at menarche 26 years. Age of menopause 65-50 Contraceptive History Oral contraceptives. Gravida 1 Length (months) of breastfeeding 7-12 Maternal age 1-35 Para 1     Review of Systems (April Staton CMA; 07/13/2016 11:37 AM) General Not Present- Appetite Loss, Chills, Fatigue, Fever, Night Sweats, Weight Gain and Weight Loss. Skin Not Present- Change in Wart/Mole, Dryness, Hives, Jaundice, New Lesions, Non-Healing Wounds, Rash and Ulcer. HEENT Present- Wears glasses/contact lenses. Not Present- Earache, Hearing Loss, Hoarseness, Nose Bleed, Oral Ulcers, Ringing in the Ears, Seasonal Allergies, Sinus Pain, Sore Throat, Visual Disturbances and Yellow Eyes. Respiratory Not Present- Bloody sputum, Chronic Cough, Difficulty Breathing, Snoring and Wheezing. Breast Not Present- Breast Mass, Breast Pain, Nipple Discharge and Skin Changes. Cardiovascular Not Present- Chest Pain, Difficulty Breathing Lying Down, Leg Cramps, Palpitations, Rapid Heart Rate, Shortness of Breath and Swelling of Extremities. Gastrointestinal Present- Bloody Stool. Not Present- Abdominal Pain, Bloating, Change in Bowel Habits, Chronic diarrhea, Constipation, Difficulty Swallowing, Excessive gas, Gets full quickly at meals, Hemorrhoids, Indigestion, Nausea, Rectal  Pain and Vomiting. Female Genitourinary Not Present- Frequency, Nocturia, Painful Urination, Pelvic Pain and Urgency. Musculoskeletal Not Present- Back Pain,  Joint Pain, Joint Stiffness, Muscle Pain, Muscle Weakness and Swelling of Extremities. Neurological Not Present- Decreased Memory, Fainting, Headaches, Numbness, Seizures, Tingling, Tremor, Trouble walking and Weakness. Psychiatric Not Present- Anxiety, Bipolar, Change in Sleep Pattern, Depression, Fearful and Frequent crying. Endocrine Not Present- Cold Intolerance, Excessive Hunger, Hair Changes, Heat Intolerance, Hot flashes and New Diabetes. Hematology Not Present- Blood Thinners, Easy Bruising, Excessive bleeding, Gland problems, HIV and Persistent Infections.  Vitals (April Staton CMA; 07/13/2016 11:39 AM) 07/13/2016 11:38 AM Weight: 134 lb Height: 67in Height was reported by patient. Body Surface Area: 1.71 m Body Mass Index: 20.99 kg/m  Temp.: 98.45F(Oral)  Pulse: 95 (Regular)  P.OX: 98% (Room air) BP: 149/90 (Sitting, Left Arm, Standard)      Physical Exam Patricia Hollingshead MD; 07/13/2016 12:47 PM)  The physical exam findings are as follows: Note:General: Thin female in NAD. Pleasant and cooperative.  HEENT: [place]/AT, no external nasal or ear masses, mucous membranes are moist  EYES: EOMI, no scleral icterus, pupils normal  NECK: Supple, no obvious mass or thyroid mass/enlargement, no trachea deviation  CV: RRR, no murmur, no edema  CHEST: Breath sounds equal and clear. Respirations nonlabored.  ABDOMEN: Soft, nontender, nondistended, no masses, no organomegaly, active bowel sounds, no scars, no hernias.  MUSCULOSKELETAL: FROM, good muscle tone, no edema, no venous stasis changes, normal station and gait  LYMPHATIC: No palpable cervical, supraclavicular adenopathy.  SKIN: No jaundice or suspicious rashes.  NEUROLOGIC: Alert and oriented, answers questions appropriately, normal gait and  station.  PSYCHIATRIC: Normal mood, affect , and behavior.    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 Mutation testing pending. I have reviewed the CT scans, colonoscopy, an MRI. I have discussed her situation with Patricia Hudson and Dr. Benay Hudson.  Plan: Laparoscopic-assisted partial colectomy and resection of liver mass. I have explained the procedure and risks of colon resection. Risks include but are not limited to bleeding, infection, wound problems, anesthesia, anastomotic leak, need for colostomy, need for reoperative surgery, bile leak, injury to intraabominal organs (such as intestine, spleen, kidney, bladder, ureter, etc.), ileus, irregular bowel habits. She seems to understand and agrees to proceed.  Current Plans Follow up as needed Schedule for Surgery Pt Education - [facility] Colon Bowel Prep 2015 Miralax/Antibiotics Started Neomycin Sulfate 500MG , 2 (two) Tablet SEE NOTE, #6, 07/13/2016, No Refill. Local Order: TAKE TWO TABLETS AT 2 PM, 3 PM, AND 10 PM THE DAY PRIOR TO SURGERY Started Flagyl 500MG , 2 (two) Tablet SEE NOTE, #6, 07/13/2016, No Refill. Local Order: Take at 2pm, 3pm, and 10pm the day prior to your colon operation Pt Education - [facility] Free Text Education/Instructions: discussed with patient and provided information. LIVER MASS, RIGHT LOBE (R16.0) Impression: 7-10 mm in size. Suspicious but not definitive for metastasis on MRI. PET scan could be helpful but I will leave this up to Dr. Benay Hudson.  Plan: Resection of mass during partial colectomy.  Jackolyn Confer, MD

## 2016-07-27 NOTE — Anesthesia Preprocedure Evaluation (Addendum)
Anesthesia Evaluation  Patient identified by MRN, date of birth, ID band Patient awake    Reviewed: Allergy & Precautions, H&P , NPO status , Patient's Chart, lab work & pertinent test results  History of Anesthesia Complications Negative for: history of anesthetic complications  Airway Mallampati: II  TM Distance: >3 FB Neck ROM: full    Dental no notable dental hx.    Pulmonary neg pulmonary ROS,    Pulmonary exam normal breath sounds clear to auscultation       Cardiovascular negative cardio ROS Normal cardiovascular exam Rhythm:regular Rate:Normal     Neuro/Psych negative neurological ROS     GI/Hepatic negative GI ROS, Neg liver ROS,   Endo/Other  negative endocrine ROS  Renal/GU Renal disease     Musculoskeletal   Abdominal   Peds  Hematology negative hematology ROS (+)   Anesthesia Other Findings Hgb is up to 11 now, type and screen confirmed  Reproductive/Obstetrics negative OB ROS                           Anesthesia Physical Anesthesia Plan  ASA: III  Anesthesia Plan: General   Post-op Pain Management:    Induction: Intravenous  Airway Management Planned: Oral ETT  Additional Equipment:   Intra-op Plan:   Post-operative Plan: Extubation in OR  Informed Consent: I have reviewed the patients History and Physical, chart, labs and discussed the procedure including the risks, benefits and alternatives for the proposed anesthesia with the patient or authorized representative who has indicated his/her understanding and acceptance.   Dental Advisory Given  Plan Discussed with: Anesthesiologist, CRNA and Surgeon  Anesthesia Plan Comments: (2 PIVs please)        Anesthesia Quick Evaluation

## 2016-07-27 NOTE — Interval H&P Note (Signed)
History and Physical Interval Note:  07/27/2016 10:58 AM  Patricia Hudson  has presented today for surgery, with the diagnosis of sigmoid colon cancer, liver mass  The various methods of treatment have been discussed with the patient and family. After consideration of risks, benefits and other options for treatment, the patient has consented to  Procedure(s): LAPAROSCOPIC PARTIAL COLECTOMY (N/A) REMOVAL OF LIVER MASS (N/A) as a surgical intervention .  The patient's history has been reviewed, patient examined, no change in status, stable for surgery.  I have reviewed the patient's chart and labs.  Questions were answered to the patient's satisfaction.     Amiir Heckard Lenna Sciara

## 2016-07-28 ENCOUNTER — Telehealth: Payer: Self-pay | Admitting: Oncology

## 2016-07-28 ENCOUNTER — Encounter (HOSPITAL_COMMUNITY): Payer: Self-pay | Admitting: General Surgery

## 2016-07-28 LAB — BASIC METABOLIC PANEL
ANION GAP: 5 (ref 5–15)
CALCIUM: 8 mg/dL — AB (ref 8.9–10.3)
CO2: 23 mmol/L (ref 22–32)
CREATININE: 0.6 mg/dL (ref 0.44–1.00)
Chloride: 110 mmol/L (ref 101–111)
GFR calc Af Amer: >60 mL/min (ref >60)
GLUCOSE: 236 mg/dL — AB (ref 65–99)
Potassium: 3.5 mmol/L (ref 3.5–5.1)
Sodium: 138 mmol/L (ref 135–145)

## 2016-07-28 LAB — CBC
HCT: 23.1 % — ABNORMAL LOW (ref 36.0–46.0)
Hemoglobin: 7.8 g/dL — ABNORMAL LOW (ref 12.0–15.0)
MCH: 32 pg (ref 26.0–34.0)
MCHC: 33.8 g/dL (ref 30.0–36.0)
MCV: 94.7 fL (ref 78.0–100.0)
PLATELETS: 164 10*3/uL (ref 150–400)
RBC: 2.44 MIL/uL — ABNORMAL LOW (ref 3.87–5.11)
RDW: 14.6 % (ref 11.5–15.5)
WBC: 5.8 10*3/uL (ref 4.0–10.5)

## 2016-07-28 MED ORDER — KCL IN DEXTROSE-NACL 20-5-0.9 MEQ/L-%-% IV SOLN
INTRAVENOUS | Status: DC
Start: 2016-07-28 — End: 2016-07-31
  Administered 2016-07-28 – 2016-07-30 (×6): via INTRAVENOUS
  Filled 2016-07-28 (×6): qty 1000

## 2016-07-28 NOTE — Telephone Encounter (Signed)
Spoke with pt to confirm r/s appt to 9/20 per LOS

## 2016-07-28 NOTE — Care Management Note (Signed)
Case Management Note  Patient Details  Name: ARETHA DORNAK MRN: [id number] Date of Birth: 11/28/1965  Subjective/Objective:          Colectomy post op hypotension with arterial lines for monitoring, moved to med surg on [id number].          Action/Plan: home when stable   Expected Discharge Date:                  Expected Discharge Plan:  Home/Self Care  In-House Referral:  NA  Discharge planning Services  NA  Post Acute Care Choice:  NA, Durable Medical Equipment Choice offered to:  NA  DME Arranged:  N/A DME Agency:  NA  HH Arranged:  NA HH Agency:  NA  Status of Service:  In process, will continue to follow  If discussed at Long Length of Stay Meetings, dates discussed:    Additional Comments:Date:  July 28, 2016 Chart reviewed for concurrent status and case management needs. Will continue to follow the patient for status change: Discharge Planning: following for needs Expected discharge date: [id number] Velva Harman, BSN, [place], [place]  Leeroy Cha, RN 07/28/2016, 10:00 AM

## 2016-07-28 NOTE — Progress Notes (Signed)
1 Day Post-Op  Subjective: Not having much pain.  No nausea.  We discussed the operation.  Objective: Vital signs in last 24 hours: Temp:  [97.6 F (36.4 C)-99.7 F (37.6 C)] 98.7 F (37.1 C) (09/08 0400) Pulse Rate:  [85-106] 92 (09/08 0000) Resp:  [10-28] 16 (09/08 0400) BP: (98-126)/(49-83) 98/49 (09/08 0000) SpO2:  [97 %-100 %] 100 % (09/08 0400) Arterial Line BP: (106-136)/(54-80) 106/57 (09/08 0000) Weight:  [59 kg (130 lb)] 59 kg (130 lb) (09/07 0951)    Intake/Output from previous day: 09/07 0701 - 09/08 0700 In: 7520 [I.V.:7220; IV Piggyback:300] Out: 3205 [Urine:2220; Drains:185; Blood:800] Intake/Output this shift: No intake/output data recorded.  PE: General- In NAD Abdomen-soft, quiet, thin bloody output from both drains, some dried drainage on dressings  Lab Results:   Recent Labs  07/27/16 1006 07/27/16 1816 07/28/16 0443  WBC 4.3  --  5.8  HGB 11.5* 8.5* 7.8*  HCT 34.7* 25.0* 23.1*  PLT 232  --  164   BMET  Recent Labs  07/27/16 1006 07/28/16 0443  NA  --  138  K  --  3.5  CL  --  110  CO2  --  23  GLUCOSE  --  236*  BUN  --  <5*  CREATININE 0.82 0.60  CALCIUM  --  8.0*   PT/INR No results for input(s): LABPROT, INR in the last 72 hours. Comprehensive Metabolic Panel:    Component Value Date/Time   NA 138 07/28/2016 0443   NA 142 07/15/2016 0255   K 3.5 07/28/2016 0443   K 3.6 07/15/2016 0255   CL 110 07/28/2016 0443   CL 115 (H) 07/15/2016 0255   CO2 23 07/28/2016 0443   CO2 23 07/15/2016 0255   BUN <5 (L) 07/28/2016 0443   BUN <5 (L) 07/15/2016 0255   CREATININE 0.60 07/28/2016 0443   CREATININE 0.82 07/27/2016 1006   CREATININE 0.92 09/17/2014 1409   GLUCOSE 236 (H) 07/28/2016 0443   GLUCOSE 88 07/15/2016 0255   CALCIUM 8.0 (L) 07/28/2016 0443   CALCIUM 8.4 (L) 07/15/2016 0255   AST 18 07/13/2016 2108   AST 19 09/17/2014 1409   ALT 11 (L) 07/13/2016 2108   ALT 10 09/17/2014 1409   ALKPHOS 58 07/13/2016 2108   ALKPHOS 73 09/17/2014 1409   BILITOT 0.5 07/13/2016 2108   BILITOT 0.6 09/17/2014 1409   PROT 5.2 (L) 07/13/2016 2108   PROT 7.1 09/17/2014 1409   ALBUMIN 3.4 (L) 07/13/2016 2108   ALBUMIN 4.9 09/17/2014 1409     Studies/Results: No results found.  Anti-infectives: Anti-infectives    Start     Dose/Rate Route Frequency Ordered Stop   07/28/16 0600  cefoTEtan (CEFOTAN) 2 g in dextrose 5 % 50 mL IVPB     2 g 100 mL/hr over 30 Minutes Intravenous Every 12 hours 07/27/16 1941 07/28/16 0630   07/27/16 0928  cefoTEtan in Dextrose 5% (CEFOTAN) IVPB 2 g     2 g Intravenous On call to O.R. 07/27/16 0928 07/27/16 1753      Assessment Principal Problem:   Colon cancer [employer]) s/p laparoscopic partial colectomy and wedge resection of liver lesion 07/27/16-feels pretty good this AM    ABL anemia-hemoglobin 7.8    LOS: 1 day   Plan: Transfer to floor.  Get OOB.  Clear liquid diet.   Patricia Hudson 07/28/2016

## 2016-07-28 NOTE — Progress Notes (Signed)
Patient walked 5 laps around entire unit.  Gait steady, no complaints afterwards.

## 2016-07-28 NOTE — Progress Notes (Signed)
1 Day Post-Op  Subjective: Had a fair amount of pain last night, but doing better this AM.  Doing IS.    Objective: Vital signs in last 24 hours: Temp:  [97.6 F (36.4 C)-99.7 F (37.6 C)] 98.6 F (37 C) (09/08 0800) Pulse Rate:  [85-106] 100 (09/08 0830) Resp:  [10-28] 18 (09/08 0830) BP: (98-126)/(49-80) 116/70 (09/08 0800) SpO2:  [95 %-100 %] 100 % (09/08 0830) Arterial Line BP: (106-138)/(54-80) 138/70 (09/08 0830) Weight:  [59 kg (130 lb)] 59 kg (130 lb) (09/07 0951)    Intake/Output from previous day: 09/07 0701 - 09/08 0700 In: 7520 [I.V.:7220; IV Piggyback:300] Out: 3205 [Urine:2220; Drains:185; Blood:800] Intake/Output this shift: Total I/O In: 1760 [I.V.:1760] Out: 400 [Urine:400]  General appearance: alert, cooperative and no distress Resp: breathing comfortably Cardio: regular rate and rhythm GI: soft, approp tender.  drains serosang.  some leakage of fluid around drain sites.    Lab Results:   Recent Labs  07/27/16 1006 07/27/16 1816 07/28/16 0443  WBC 4.3  --  5.8  HGB 11.5* 8.5* 7.8*  HCT 34.7* 25.0* 23.1*  PLT 232  --  164   BMET  Recent Labs  07/27/16 1006 07/28/16 0443  NA  --  138  K  --  3.5  CL  --  110  CO2  --  23  GLUCOSE  --  236*  BUN  --  <5*  CREATININE 0.82 0.60  CALCIUM  --  8.0*   PT/INR No results for input(s): LABPROT, INR in the last 72 hours. ABG No results for input(s): PHART, HCO3 in the last 72 hours.  Invalid input(s): PCO2, PO2  Studies/Results: No results found.  Anti-infectives: Anti-infectives    Start     Dose/Rate Route Frequency Ordered Stop   07/28/16 0600  cefoTEtan (CEFOTAN) 2 g in dextrose 5 % 50 mL IVPB     2 g 100 mL/hr over 30 Minutes Intravenous Every 12 hours 07/27/16 1941 07/28/16 0630   07/27/16 0928  cefoTEtan in Dextrose 5% (CEFOTAN) IVPB 2 g     2 g Intravenous On call to O.R. 07/27/16 0928 07/27/16 1753      Assessment/Plan: s/p Procedure(s): LAPAROSCOPIC PARTIAL COLECTOMY  MOBILIZATION OF SPLENIC FLEXURE LAPAROSCOPIC SEGMENTAL LIVER RESECTION OF SEGMENT 8 OF LIVER (N/A) REMOVAL OF LIVER MASS (N/A) Dr. Zella Richer is transferring upstairs.  Keep foley until tomorrow. Watch drain for any evidence of bile leak. Acute and chronic blood loss anemia - no transfusion at this time, but might need it at some point this admission.  Had a GI bleed around 2 weeks ago and rebounded quickly.   Await pathology.    LOS: 1 day    [facility] 07/28/2016

## 2016-07-29 LAB — CBC
HEMATOCRIT: 23.2 % — AB (ref 36.0–46.0)
Hemoglobin: 7.7 g/dL — ABNORMAL LOW (ref 12.0–15.0)
MCH: 32 pg (ref 26.0–34.0)
MCHC: 33.2 g/dL (ref 30.0–36.0)
MCV: 96.3 fL (ref 78.0–100.0)
PLATELETS: 134 10*3/uL — AB (ref 150–400)
RBC: 2.41 MIL/uL — ABNORMAL LOW (ref 3.87–5.11)
RDW: 14.6 % (ref 11.5–15.5)
WBC: 5.5 10*3/uL (ref 4.0–10.5)

## 2016-07-29 LAB — BASIC METABOLIC PANEL
Anion gap: 4 — ABNORMAL LOW (ref 5–15)
CO2: 27 mmol/L (ref 22–32)
Calcium: 8.1 mg/dL — ABNORMAL LOW (ref 8.9–10.3)
Chloride: 111 mmol/L (ref 101–111)
Creatinine, Ser: 0.6 mg/dL (ref 0.44–1.00)
GFR calc Af Amer: >60 mL/min (ref >60)
GFR calc non Af Amer: >60 mL/min (ref >60)
GLUCOSE: 121 mg/dL — AB (ref 65–99)
Potassium: 4 mmol/L (ref 3.5–5.1)
Sodium: 142 mmol/L (ref 135–145)

## 2016-07-29 MED ORDER — HYDROCODONE-ACETAMINOPHEN 5-325 MG PO TABS
1.0000 | ORAL_TABLET | ORAL | Status: DC | PRN
  Administered 2016-07-29 – 2016-08-01 (×14): 1 via ORAL
  Filled 2016-07-29 (×15): qty 1

## 2016-07-29 NOTE — Progress Notes (Signed)
Ambulated 3 laps around the unit this afternoon. Patient tolerated the activity.

## 2016-07-29 NOTE — Progress Notes (Signed)
Patient ID: Patricia Hudson, female   DOB: 12-Sep-1966, 50 y.o.   MRN: [id number]  [facility]  Subjective: POD#2 Patient up in bed.  Foley out.  Tolerating clear liquids.  No nausea.  Limited pain.  Objective: Vital signs in last 24 hours: Temp:  [97.6 F (36.4 C)-100.1 F (37.8 C)] 98.3 F (36.8 C) (09/09 0508) Pulse Rate:  [84-98] 98 (09/09 0508) Resp:  [14-23] 14 (09/09 0752) BP: (116-139)/(66-80) 116/66 (09/09 0508) SpO2:  [95 %-99 %] 95 % (09/09 0752) Last BM Date:  (prior to admit )  Intake/Output from previous day: 09/08 0701 - 09/09 0700 In: 6867.4 [I.V.:6677.4; IV Piggyback:100] Out: [id number] [Urine:3650; Drains:140] Intake/Output this shift: No intake/output data recorded.  Physical Exam: HEENT - sclerae clear, mucous membranes moist Neck - soft Chest - clear bilaterally Cor - RRR Abdomen - soft without distension; dressings dry; JP with thin serosanguinous; RUQ drain with small serous output Ext - no edema, non-tender Neuro - alert & oriented, no focal deficits  Lab Results:   Recent Labs  07/28/16 0443 07/29/16 0414  WBC 5.8 5.5  HGB 7.8* 7.7*  HCT 23.1* 23.2*  PLT 164 134*   BMET  Recent Labs  07/28/16 0443 07/29/16 0414  NA 138 142  K 3.5 4.0  CL 110 111  CO2 23 27  GLUCOSE 236* 121*  BUN <5* <5*  CREATININE 0.60 0.60  CALCIUM 8.0* 8.1*   PT/INR No results for input(s): LABPROT, INR in the last 72 hours. Comprehensive Metabolic Panel:    Component Value Date/Time   NA 142 07/29/2016 0414   NA 138 07/28/2016 0443   K 4.0 07/29/2016 0414   K 3.5 07/28/2016 0443   CL 111 07/29/2016 0414   CL 110 07/28/2016 0443   CO2 27 07/29/2016 0414   CO2 23 07/28/2016 0443   BUN <5 (L) 07/29/2016 0414   BUN <5 (L) 07/28/2016 0443   CREATININE 0.60 07/29/2016 0414   CREATININE 0.60 07/28/2016 0443   CREATININE 0.92 09/17/2014 1409   GLUCOSE 121 (H) 07/29/2016 0414   GLUCOSE 236 (H) 07/28/2016 0443   CALCIUM  8.1 (L) 07/29/2016 0414   CALCIUM 8.0 (L) 07/28/2016 0443   AST 18 07/13/2016 2108   AST 19 09/17/2014 1409   ALT 11 (L) 07/13/2016 2108   ALT 10 09/17/2014 1409   ALKPHOS 58 07/13/2016 2108   ALKPHOS 73 09/17/2014 1409   BILITOT 0.5 07/13/2016 2108   BILITOT 0.6 09/17/2014 1409   PROT 5.2 (L) 07/13/2016 2108   PROT 7.1 09/17/2014 1409   ALBUMIN 3.4 (L) 07/13/2016 2108   ALBUMIN 4.9 09/17/2014 1409    Studies/Results: No results found.  Assessment & Plans: Status post sigmoid colectomy, wedge resection of liver  Continue clear liquid diet  Discontinue PCA - trial PO pain Rx  OOB, ambulate in halls  Await path results  Earnstine Regal, MD, [facility] Office: [place] 07/29/2016

## 2016-07-30 MED ORDER — FAMOTIDINE 20 MG PO TABS
20.0000 mg | ORAL_TABLET | Freq: Two times a day (BID) | ORAL | Status: DC
  Administered 2016-07-30 – 2016-07-31 (×4): 20 mg via ORAL
  Filled 2016-07-30 (×4): qty 1

## 2016-07-30 NOTE — Progress Notes (Signed)
Patient ID: Patricia Hudson, female   DOB: 01-Jun-1966, 50 y.o.   MRN: [id number]  [facility]  Subjective: POD#3  Patient in bed, no complaints.  Doing well with PO pain Rx.  Ambulating.  Tolerating clear liquid diet - no nausea or emesis. No flatus or BM yet.  Objective: Vital signs in last 24 hours: Temp:  [98.2 F (36.8 C)-99.7 F (37.6 C)] 98.2 F (36.8 C) (09/10 0500) Pulse Rate:  [93-105] 100 (09/10 0500) Resp:  [15-16] 16 (09/10 0500) BP: (122-131)/(76-87) 127/76 (09/10 0500) SpO2:  [100 %] 100 % (09/10 0500) Last BM Date:  (prior to admit )  Intake/Output from previous day: 09/09 0701 - 09/10 0700 In: 3042.8 [P.O.:480; I.V.:2453; IV Piggyback:109.8] Out: 160 [Drains:160] Intake/Output this shift: Total I/O In: -  Out: 30 [Drains:30]  Physical Exam: HEENT - sclerae clear, mucous membranes moist Neck - soft Abdomen - soft without distension; wounds clear and dry; drains with thin serosanguinous; active BS present Ext - no edema, non-tender Neuro - alert & oriented, no focal deficits  Lab Results:   Recent Labs  07/28/16 0443 07/29/16 0414  WBC 5.8 5.5  HGB 7.8* 7.7*  HCT 23.1* 23.2*  PLT 164 134*   BMET  Recent Labs  07/28/16 0443 07/29/16 0414  NA 138 142  K 3.5 4.0  CL 110 111  CO2 23 27  GLUCOSE 236* 121*  BUN <5* <5*  CREATININE 0.60 0.60  CALCIUM 8.0* 8.1*   PT/INR No results for input(s): LABPROT, INR in the last 72 hours. Comprehensive Metabolic Panel:    Component Value Date/Time   NA 142 07/29/2016 0414   NA 138 07/28/2016 0443   K 4.0 07/29/2016 0414   K 3.5 07/28/2016 0443   CL 111 07/29/2016 0414   CL 110 07/28/2016 0443   CO2 27 07/29/2016 0414   CO2 23 07/28/2016 0443   BUN <5 (L) 07/29/2016 0414   BUN <5 (L) 07/28/2016 0443   CREATININE 0.60 07/29/2016 0414   CREATININE 0.60 07/28/2016 0443   CREATININE 0.92 09/17/2014 1409   GLUCOSE 121 (H) 07/29/2016 0414   GLUCOSE 236 (H)  07/28/2016 0443   CALCIUM 8.1 (L) 07/29/2016 0414   CALCIUM 8.0 (L) 07/28/2016 0443   AST 18 07/13/2016 2108   AST 19 09/17/2014 1409   ALT 11 (L) 07/13/2016 2108   ALT 10 09/17/2014 1409   ALKPHOS 58 07/13/2016 2108   ALKPHOS 73 09/17/2014 1409   BILITOT 0.5 07/13/2016 2108   BILITOT 0.6 09/17/2014 1409   PROT 5.2 (L) 07/13/2016 2108   PROT 7.1 09/17/2014 1409   ALBUMIN 3.4 (L) 07/13/2016 2108   ALBUMIN 4.9 09/17/2014 1409    Studies/Results: No results found.  Assessment & Plans: Status post sigmoid colectomy, wedge resection of liver                       Advance diet to full liquids                       PO pain Rx                       OOB, ambulate in halls                       Await path results - still pending this AM  Earnstine Regal, MD, [facility] Office: [phone]   Jarious Lyon Jerilynn Mages 07/30/2016

## 2016-07-31 LAB — HEMOGLOBIN AND HEMATOCRIT, BLOOD
HCT: 28.3 % — ABNORMAL LOW (ref 36.0–46.0)
Hemoglobin: 9.3 g/dL — ABNORMAL LOW (ref 12.0–15.0)

## 2016-07-31 NOTE — Progress Notes (Signed)
1. 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 - TUMOR FOCALLY INVADES THROUGH MUSCULARIS PROPRIA INTO PERICOLONIC TISSUE. - RESECTION MARGINS ARE NEGATIVE. - METASTATIC CARCINOMA IN ELEVEN OF SIXTEEN LYMPH NODES (11/16). - SEE ONCOLOGY TABLE. 2. Colon, resection margin (donut), distal - BENIGN COLONIC MUCOSA. - NO DYSPLASIA OR MALIGNANCY. 3. Liver, partial resection, segment 8 - METASTATIC ADENOCARCINOMA. - RESECTION MARGIN IS NEGATIVE.  I discussed the above pathology with her and her husband.  Dr. Benay Spice is aware as well and will see her in the morning.  Questions were answered.  We discussed eventually putting in a Port-a-cath for chemotherapy in the near future.

## 2016-07-31 NOTE — Progress Notes (Signed)
4 Days Post-Op  Subjective: Tolerating full liquids.  Comfortable.  Walking.  No flatus or BM.  Objective: Vital signs in last 24 hours: Temp:  [99 F (37.2 C)-99.1 F (37.3 C)] 99 F (37.2 C) (09/11 0550) Pulse Rate:  [85-86] 85 (09/11 0550) Resp:  [16-18] 16 (09/11 0550) BP: (120-124)/(74-87) 124/87 (09/11 0550) SpO2:  [100 %] 100 % (09/11 0550) Last BM Date: 07/26/16  Intake/Output from previous day: 09/10 0701 - 09/11 0700 In: 1716.9 [I.V.:1716.9] Out: 110 [Drains:110] Intake/Output this shift: No intake/output data recorded.  PE: General- In NAD Abdomen-soft, quiet, active bowel sounds, thin serous output from both drains, wounds clean  Lab Results:   Recent Labs  07/29/16 0414  WBC 5.5  HGB 7.7*  HCT 23.2*  PLT 134*   BMET  Recent Labs  07/29/16 0414  NA 142  K 4.0  CL 111  CO2 27  GLUCOSE 121*  BUN <5*  CREATININE 0.60  CALCIUM 8.1*   PT/INR No results for input(s): LABPROT, INR in the last 72 hours. Comprehensive Metabolic Panel:    Component Value Date/Time   NA 142 07/29/2016 0414   NA 138 07/28/2016 0443   K 4.0 07/29/2016 0414   K 3.5 07/28/2016 0443   CL 111 07/29/2016 0414   CL 110 07/28/2016 0443   CO2 27 07/29/2016 0414   CO2 23 07/28/2016 0443   BUN <5 (L) 07/29/2016 0414   BUN <5 (L) 07/28/2016 0443   CREATININE 0.60 07/29/2016 0414   CREATININE 0.60 07/28/2016 0443   CREATININE 0.92 09/17/2014 1409   GLUCOSE 121 (H) 07/29/2016 0414   GLUCOSE 236 (H) 07/28/2016 0443   CALCIUM 8.1 (L) 07/29/2016 0414   CALCIUM 8.0 (L) 07/28/2016 0443   AST 18 07/13/2016 2108   AST 19 09/17/2014 1409   ALT 11 (L) 07/13/2016 2108   ALT 10 09/17/2014 1409   ALKPHOS 58 07/13/2016 2108   ALKPHOS 73 09/17/2014 1409   BILITOT 0.5 07/13/2016 2108   BILITOT 0.6 09/17/2014 1409   PROT 5.2 (L) 07/13/2016 2108   PROT 7.1 09/17/2014 1409   ALBUMIN 3.4 (L) 07/13/2016 2108   ALBUMIN 4.9 09/17/2014 1409     Studies/Results: No results  found.  Anti-infectives: Anti-infectives    Start     Dose/Rate Route Frequency Ordered Stop   07/28/16 0600  cefoTEtan (CEFOTAN) 2 g in dextrose 5 % 50 mL IVPB     2 g 100 mL/hr over 30 Minutes Intravenous Every 12 hours 07/27/16 1941 07/28/16 0630   07/27/16 0928  cefoTEtan in Dextrose 5% (CEFOTAN) IVPB 2 g     2 g Intravenous On call to O.R. 07/27/16 0928 07/27/16 1753      Assessment Principal Problem:   Colon cancer ([place]) s/p laparoscopic partial colectomy and wedge resection of liver lesion 07/27/16-still with some ileus    ABL anemia    LOS: 4 days   Plan: Advance to solid diet.  Check hemoglobin.   Patricia Hudson J 07/31/2016

## 2016-08-01 ENCOUNTER — Ambulatory Visit: Payer: Self-pay | Admitting: General Surgery

## 2016-08-01 ENCOUNTER — Telehealth: Payer: Self-pay | Admitting: *Deleted

## 2016-08-01 MED ORDER — HYDROCODONE-ACETAMINOPHEN 5-325 MG PO TABS
1.0000 | ORAL_TABLET | ORAL | 0 refills | Status: DC | PRN
Start: 2016-08-01 — End: 2016-08-09

## 2016-08-01 NOTE — Discharge Summary (Signed)
Physician Discharge Summary  Patient ID: Patricia Hudson MRN: [id number] DOB/AGE: 1966-05-23 50 y.o.  Admit date: 07/27/2016 Discharge date: 08/01/2016  Admission Diagnoses:  Sigmoid colon cancer  Discharge Diagnoses:  Principal Problem:   Stage IV sigmoid colon cancer [facility]) s/p laparoscopic partial colectomy and wedge resection of liver lesion 07/27/16    Discharged Condition: good  Hospital Course: She underwent the above procedure and progressed along the post op colorectal surgery pathway well.  Final pathology demonstrated 11/16 lymph nodes positive and a positive liver lesion.  This was explained to her and her husband by Dr. Benay Spice and myself.  By her 5th postop day she was able to have her drains removed and by discharged. Discharge instructions were given to her.  She will have a Port-a-cath placed later this month for chemotherapy.   Discharge Exam: Blood pressure 135/85, pulse 82, temperature 98.2 F (36.8 C), temperature source Oral, resp. rate 18, height 5\' 8"  (1.727 m), weight 59 kg (130 lb), last menstrual period 04/20/2013, SpO2 100 %.   Disposition: 01-Home or Self Care     Medication List    TAKE these medications   acetaminophen 325 MG tablet Commonly known as:  TYLENOL Take 650 mg by mouth daily as needed for mild pain or headache.   HYDROcodone-acetaminophen 5-325 MG tablet Commonly known as:  NORCO/VICODIN Take 1-2 tablets by mouth every 4 (four) hours as needed for moderate pain.        Signed: [name] 08/01/2016, 8:16 AM

## 2016-08-01 NOTE — Progress Notes (Signed)
5 Days Post-Op  Subjective: Tolerating diet.  Bowels moving.  Feels good.  Objective: Vital signs in last 24 hours: Temp:  [98.2 F (36.8 C)-99.1 F (37.3 C)] 98.2 F (36.8 C) (09/12 0533) Pulse Rate:  [82-102] 82 (09/12 0600) Resp:  [16-18] 18 (09/12 0533) BP: (123-155)/(72-111) 135/85 (09/12 0600) SpO2:  [99 %-100 %] 100 % (09/12 0533) Last BM Date: 07/27/16  Intake/Output from previous day: 09/11 0701 - 09/12 0700 In: 480 [P.O.:480] Out: 160 [Drains:160] Intake/Output this shift: No intake/output data recorded.  PE: General- In NAD Abdomen-soft, incisions are clean and intact, thin serous output from both drains-drains removed.  Lab Results:   Recent Labs  07/31/16 0831  HGB 9.3*  HCT 28.3*   BMET No results for input(s): NA, K, CL, CO2, GLUCOSE, BUN, CREATININE, CALCIUM in the last 72 hours. PT/INR No results for input(s): LABPROT, INR in the last 72 hours. Comprehensive Metabolic Panel:    Component Value Date/Time   NA 142 07/29/2016 0414   NA 138 07/28/2016 0443   K 4.0 07/29/2016 0414   K 3.5 07/28/2016 0443   CL 111 07/29/2016 0414   CL 110 07/28/2016 0443   CO2 27 07/29/2016 0414   CO2 23 07/28/2016 0443   BUN <5 (L) 07/29/2016 0414   BUN <5 (L) 07/28/2016 0443   CREATININE 0.60 07/29/2016 0414   CREATININE 0.60 07/28/2016 0443   CREATININE 0.92 09/17/2014 1409   GLUCOSE 121 (H) 07/29/2016 0414   GLUCOSE 236 (H) 07/28/2016 0443   CALCIUM 8.1 (L) 07/29/2016 0414   CALCIUM 8.0 (L) 07/28/2016 0443   AST 18 07/13/2016 2108   AST 19 09/17/2014 1409   ALT 11 (L) 07/13/2016 2108   ALT 10 09/17/2014 1409   ALKPHOS 58 07/13/2016 2108   ALKPHOS 73 09/17/2014 1409   BILITOT 0.5 07/13/2016 2108   BILITOT 0.6 09/17/2014 1409   PROT 5.2 (L) 07/13/2016 2108   PROT 7.1 09/17/2014 1409   ALBUMIN 3.4 (L) 07/13/2016 2108   ALBUMIN 4.9 09/17/2014 1409     Studies/Results: No results found.  Anti-infectives: Anti-infectives    Start     Dose/Rate  Route Frequency Ordered Stop   07/28/16 0600  cefoTEtan (CEFOTAN) 2 g in dextrose 5 % 50 mL IVPB     2 g 100 mL/hr over 30 Minutes Intravenous Every 12 hours 07/27/16 1941 07/28/16 0630   07/27/16 0928  cefoTEtan in Dextrose 5% (CEFOTAN) IVPB 2 g     2 g Intravenous On call to O.R. 07/27/16 0928 07/27/16 1753      Assessment Principal Problem:   Stage IV sigmoid colon cancer ([place]) s/p laparoscopic partial colectomy and wedge resection of liver lesion 07/27/16-bowel function has returned and she is doing well    ABL anemia-improved    LOS: 5 days   Plan:  Discharge today.  Instructions given to her.  Schedule outpatient Port-a-cath insertion for later this month. The procedure risks and aftercare been explained. Risks include but are not limited to bleeding, infection, malfunction, pneumothorax, wound problems, DVT.   Taelor Moncada J 08/01/2016

## 2016-08-01 NOTE — Discharge Instructions (Signed)
Removal of the Colon (Colectomy) Care After  HOME CARE INSTRUCTIONS  Once home, an ice pack applied to the operative site may help with discomfort and keep swelling down.   Change dressings as directed.   Only take over-the-counter or prescription medicines for pain, discomfort, or fever as directed by your caregiver.   Eat a bland, low fiber, lowfat diet.  Drink plenty of fluids.  Do not overeat.  Do not drive for at least 2-3 weeks and then only if you are no longer taking pain medicine.  There should be no heavy lifting (more than 10 pounds), strenuous activities or contact sports for at least 6 weeks, and then only after approval of your surgeon.   Keep the wound dry and clean.  You may shower. The wound may be washed gently with soap and water. Gently blot or dab dry following cleansing, without rubbing. Do not take baths, use swimming pools, or use hot tubs for 4 weeks, or as instructed by your caregivers.     Call the office [phone]) for a followup appointment if you do not already have one.  They will call you and make an appointment to have your staples removed. SEEK MEDICAL CARE IF (call the office):   There is redness, swelling, or increasing pain in the wound area.   Pus or blood is coming from the wound.   An unexplained oral temperature above 101.5 degrees F develops.   You notice a foul smell coming from the wound or dressing.   There is a breaking open of a wound (edges not staying together) after the sutures have been removed.   There is increasing abdominal pain.   You have persistent vomiting. SEEK IMMEDIATE MEDICAL CARE IF:   A rash develops.   There is difficulty breathing, or development of a reaction or side effects to medications given.  Document Released: 06/06/2004 Document Revised: 07/19/2011 Document Reviewed: 12/10/2007 [facility] Patient Information 2012 [place].

## 2016-08-01 NOTE — Telephone Encounter (Signed)
  Oncology Nurse Navigator Documentation  Navigator Location: [facility] (08/01/16 1521) Navigator Encounter Type: Letter/Fax/Email (08/01/16 1521)   Email request to [facility] Pathology for Foundation One Testing on following case:    Patient: Patricia Hudson, Patricia Hudson Collected: 07/27/2016 Client: [facility] Accession: [id number] Received: 07/27/2016 [name], MD DOB: 13-Jul-1966 Age: 50 Gender: F Reported: 07/31/2016 [address] Patient Ph: [phone] MRN #: [id number] [place], [place] [address] Visit #: [id number] Chart #: Phone: [phone] Fax:  REPORT

## 2016-08-07 ENCOUNTER — Ambulatory Visit: Payer: PRIVATE HEALTH INSURANCE | Admitting: Oncology

## 2016-08-08 ENCOUNTER — Telehealth: Payer: Self-pay | Admitting: *Deleted

## 2016-08-08 NOTE — Telephone Encounter (Signed)
Message left on patient's private cell phone to inform her that Dr. Benay Spice can see her tomorrow morning at 0830 if that time works better for her schedule.  Instructed pt to call back if she desires appt time change.

## 2016-08-09 ENCOUNTER — Other Ambulatory Visit: Payer: Self-pay | Admitting: *Deleted

## 2016-08-09 ENCOUNTER — Encounter: Payer: Self-pay | Admitting: *Deleted

## 2016-08-09 ENCOUNTER — Ambulatory Visit (HOSPITAL_BASED_OUTPATIENT_CLINIC_OR_DEPARTMENT_OTHER): Payer: PRIVATE HEALTH INSURANCE | Admitting: Oncology

## 2016-08-09 ENCOUNTER — Telehealth: Payer: Self-pay | Admitting: Oncology

## 2016-08-09 VITALS — BP 129/83 | HR 92 | Temp 98.0°F | Resp 18 | Ht 68.0 in | Wt 130.2 lb

## 2016-08-09 DIAGNOSIS — Z23 Encounter for immunization: Secondary | ICD-10-CM

## 2016-08-09 DIAGNOSIS — C187 Malignant neoplasm of sigmoid colon: Secondary | ICD-10-CM

## 2016-08-09 DIAGNOSIS — C787 Secondary malignant neoplasm of liver and intrahepatic bile duct: Secondary | ICD-10-CM | POA: Diagnosis not present

## 2016-08-09 MED ORDER — ONDANSETRON HCL 8 MG PO TABS: 8.0000 mg | ORAL_TABLET | Freq: Two times a day (BID) | ORAL | 2 refills | Status: DC | PRN

## 2016-08-09 MED ORDER — LIDOCAINE-PRILOCAINE 2.5-2.5 % EX CREA: 1.0000 "application " | TOPICAL_CREAM | CUTANEOUS | 11 refills | Status: DC | PRN

## 2016-08-09 MED ORDER — INFLUENZA VAC SPLIT QUAD 0.5 ML IM SUSY
0.5000 mL | PREFILLED_SYRINGE | Freq: Once | INTRAMUSCULAR | Status: AC
  Administered 2016-08-09: 0.5 mL via INTRAMUSCULAR
  Filled 2016-08-09: qty 0.5

## 2016-08-09 MED ORDER — PROCHLORPERAZINE MALEATE 10 MG PO TABS: 10.0000 mg | ORAL_TABLET | Freq: Four times a day (QID) | ORAL | 1 refills | Status: DC | PRN

## 2016-08-09 NOTE — Progress Notes (Signed)
Oncology Nurse Navigator Documentation  Oncology Nurse Navigator Flowsheets 08/09/2016  Navigator Location [facility]  Navigator Encounter Type Follow-up Appt  Telephone -  Abnormal Finding Date -  Confirmed Diagnosis Date -  Surgery Date 07/27/2016  Patient Visit Type MedOnc  Treatment Phase Pre-Tx/Tx Discussion  Barriers/Navigation Needs Education  Education Preparing for Upcoming  Treatment--CAPOX regimen reviewed w/potential side effects  Interventions Education Method  Coordination of Care -  Education Method Verbal;Written;Teach-back  Support Groups/Services -  Acuity Level 2  Time Spent with Patient 45

## 2016-08-09 NOTE — Telephone Encounter (Signed)
Message sent to chemo scheduler to add. Per patient, port will be placed on 08/17/16 @ [facility]..   Avs report and appointment schedule given to patient,per 08/09/16 los.

## 2016-08-09 NOTE — Progress Notes (Signed)
[facility] OFFICE PROGRESS NOTE   Diagnosis: Colon cancer  INTERVAL HISTORY:   Ms. Rahn returns as scheduled. She underwent a rectosigmoid colectomy and resection of an isolated liver metastasis on 07/27/2016. She reports an uneventful operative recovery. No further rectal bleeding. She feels well. She has returned to work. The pathology [id number]) confirmed an invasive well-differentiated adenocarcinoma grade 3.7 cm. Tumor focally invaded into pericolonic tissue. The resection margins were negative. Metastatic carcinoma was identified in 11 of 16 lymph nodes. Metastatic adenocarcinoma was identified in the segment 8 liver resection.  Objective:  Vital signs in last 24 hours:  Blood pressure 129/83, pulse 92, temperature 98 F (36.7 C), temperature source Oral, resp. rate 18, height _0  (1.727 m), weight 130 lb 3.2 oz (59.1 kg), last menstrual period 04/20/2013, SpO2 100 %.     Resp: Lungs clear bilaterally Cardio: Regular rate and rhythm GI: No hepatosplenomegaly, healed surgical incisions Vascular: No leg edema   Lab Results:  Lab Results  Component Value Date   WBC 5.5 07/29/2016   HGB 9.3 (L) 07/31/2016   HCT 28.3 (L) 07/31/2016   MCV 96.3 07/29/2016   PLT 134 (L) 07/29/2016   NEUTROABS 2.8 07/27/2016    Lab Results  Component Value Date   NA 142 07/29/2016    No results found for: CEA1  Imaging:  No results found.  Medications: I have reviewed the patient's current medications.  Assessment/Plan: 1. Adenocarcinoma of the sigmoid colon, status post a colonoscopic biopsy 07/07/2016  Mismatch repair protein expression-normal ? Sigmoid mass noted at 20 cm from the anal verge ? Staging CTs of the chest, abdomen, and pelvis negative for evidence of metastatic disease other than an indeterminate 10 mm right liver lesion ? MRI of the abdomen 07/07/2016 revealed multiple bilateral hepatic cyst. 7 mm inferior subcapsular right liver lesion  suspicious for a metastasis ? PET scan 07/21/2016-hypermetabolic sigmoid colon mass, mildly hypermetabolic segment 6 liver lesion ? Laparoscopic assisted rectosigmoid colectomy and segment 8 liver resection 07/27/2016, pathology confirmed a T3, N2b,M1 tumor, 11/16 lymph nodes positive for metastatic carcinoma  2. Rectal bleeding secondary to #1  3.   Multiple polyps on the colonoscopy 07/07/2016  Admission08/24/2017 with  acute GI bleeding secondary to an ulcer at the cecum  4.   Family history of breast and pancreas cancer     Disposition:  Ms. Arena underwent a sigmoid resection and liver metastectomy on 07/27/2016. She has been diagnosed with stage IV colon cancer. We discussed the prognosis and adjuvant treatment options. I explained there is controversy surrounding the use of adjuvant chemotherapy in patients with resected stage IV colon cancer. However, extrapolating from patients with advanced stage III disease there is a significant potential benefit with adjuvant 5-fluorouracil based chemotherapy. I recommend adjuvant chemotherapy in her case. We discussed the benefit associated with the addition of oxaliplatin in the adjuvant setting with resected stage III patients and in patients with measurable stage IV disease.  We discussed CAPOX and FOLFOX chemotherapy. I reviewed the potential toxicities associated with these regimens. She is comfortable proceeding with CAPOX chemotherapy.  We discussed the potential for nausea/vomiting, mucositis, diarrhea, alopecia, and hematologic toxicity. We discussed the rash, sun sensitivity, hyperpigmentation, and hand/foot syndrome associated with capecitabine. We reviewed the allergic reaction and various types of neuropathy seen with oxaliplatin. She will attend a chemotherapy teaching class.  Ms. Philyaw is scheduled for placement of a Port-A-Cath next week. She will be scheduled for a first cycle of CAPOX beginning  08/22/2016.  We  discussed diet and exercise maneuvers that may decrease the chance of developing colon cancer. We also discussed data supporting the use of aspirin in the adjuvant setting, though this is not yet a standard recommendation. I do not recommend aspirin at present given the recent post polypectomy GI bleeding.  Her tumor was submitted for Foundation 1 testing.  Approximately 40 minutes were spent with the patient today. The majority of the time was used for counseling and coordination of care.  Betsy Coder, MD  08/09/2016  1:55 PM

## 2016-08-10 ENCOUNTER — Telehealth: Payer: Self-pay | Admitting: *Deleted

## 2016-08-10 ENCOUNTER — Other Ambulatory Visit (HOSPITAL_COMMUNITY)
Admission: RE | Admit: 2016-08-10 | Discharge: 2016-08-10 | Disposition: A | Discharge status: Home / Self Care | Payer: No Typology Code available for payment source | Source: Ambulatory Visit | Attending: Oncology | Admitting: Oncology

## 2016-08-10 DIAGNOSIS — C801 Malignant (primary) neoplasm, unspecified: Secondary | ICD-10-CM | POA: Insufficient documentation

## 2016-08-10 NOTE — Telephone Encounter (Signed)
Per LOS I have scheduled appts and notified the scheduler 

## 2016-08-11 ENCOUNTER — Telehealth: Payer: Self-pay | Admitting: Pharmacist

## 2016-08-11 NOTE — Telephone Encounter (Signed)
PA submitted online on covermymeds.  Requires specialty pharmacy.  I will submit to [employer] and inform them PA in progress.  May take up to 72 hrs for determination.  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  I was not able to find any information about the risk of crossover effect as I believe this to be a drug-specific trigger to the immune system.  Kennith Center, Pharm.D., CPP 08/11/2016@4 :21 PM [facility]

## 2016-08-11 NOTE — Telephone Encounter (Signed)
Oral Chemotherapy Pharmacist Encounter   New prescription for Xeloda has been sent to the [facility] outpatient pharmacy for benefit analysis and approval.   Will follow up with patient regarding insurance and pharmacy.  Pts husband, who is a physician had specific question that Dr. Benay Spice alerted [place] to regarding pts h/o Patricia Hudson w/ Bactrim. I found 1 case report detailed in [employer] of Patricia Hudson from Xeloda at day 10 of treatment.  We'll need to share this info w/ pt and her husband when counseling is provided. Pt is coming 08/22/16 for chemo ed class.  I will relay message to chemo ed RN and we can be available to provide detailed counseling if needed. Once pt starts Xeloda (planned start date of 08/22/16), we will follow up in 1-2 weeks for adherence and toxicity management.   Thank you,  Montel Clock, PharmD, [facility]

## 2016-08-14 ENCOUNTER — Other Ambulatory Visit: Payer: Self-pay | Admitting: Nurse Practitioner

## 2016-08-14 ENCOUNTER — Other Ambulatory Visit: Payer: Self-pay | Admitting: Pharmacist

## 2016-08-14 DIAGNOSIS — Z1231 Encounter for screening mammogram for malignant neoplasm of breast: Secondary | ICD-10-CM

## 2016-08-14 DIAGNOSIS — C187 Malignant neoplasm of sigmoid colon: Secondary | ICD-10-CM

## 2016-08-14 MED ORDER — CAPECITABINE 500 MG PO TABS: ORAL_TABLET | ORAL | 0 refills | Status: DC

## 2016-08-15 ENCOUNTER — Other Ambulatory Visit (HOSPITAL_BASED_OUTPATIENT_CLINIC_OR_DEPARTMENT_OTHER): Payer: PRIVATE HEALTH INSURANCE

## 2016-08-15 ENCOUNTER — Other Ambulatory Visit: Payer: PRIVATE HEALTH INSURANCE

## 2016-08-15 ENCOUNTER — Encounter: Payer: Self-pay | Admitting: *Deleted

## 2016-08-15 DIAGNOSIS — C187 Malignant neoplasm of sigmoid colon: Secondary | ICD-10-CM | POA: Diagnosis not present

## 2016-08-15 LAB — CBC WITH DIFFERENTIAL/PLATELET
BASO%: 1.3 % (ref 0.0–2.0)
BASOS ABS: 0.1 10*3/uL (ref 0.0–0.1)
EOS ABS: 0.2 10*3/uL (ref 0.0–0.5)
EOS%: 4 % (ref 0.0–7.0)
HCT: 37.2 % (ref 34.8–46.6)
HEMOGLOBIN: 12.1 g/dL (ref 11.6–15.9)
LYMPH%: 27.9 % (ref 14.0–49.7)
MCH: 30.7 pg (ref 25.1–34.0)
MCHC: 32.4 g/dL (ref 31.5–36.0)
MCV: 94.5 fL (ref 79.5–101.0)
MONO#: 0.4 10*3/uL (ref 0.1–0.9)
MONO%: 10.6 % (ref 0.0–14.0)
NEUT#: 2.3 10*3/uL (ref 1.5–6.5)
NEUT%: 56.2 % (ref 38.4–76.8)
Platelets: 279 10*3/uL (ref 145–400)
RBC: 3.93 10*6/uL (ref 3.70–5.45)
RDW: 14.1 % (ref 11.2–14.5)
WBC: 4.1 10*3/uL (ref 3.9–10.3)
lymph#: 1.2 10*3/uL (ref 0.9–3.3)

## 2016-08-15 LAB — COMPREHENSIVE METABOLIC PANEL
ALT: 45 U/L (ref 0–55)
AST: 45 U/L — AB (ref 5–34)
Albumin: 4 g/dL (ref 3.5–5.0)
Alkaline Phosphatase: 95 U/L (ref 40–150)
Anion Gap: 11 mEq/L (ref 3–11)
BILIRUBIN TOTAL: 0.33 mg/dL (ref 0.20–1.20)
BUN: 11.3 mg/dL (ref 7.0–26.0)
CHLORIDE: 108 meq/L (ref 98–109)
CO2: 23 meq/L (ref 22–29)
CREATININE: 0.8 mg/dL (ref 0.6–1.1)
Calcium: 10.1 mg/dL (ref 8.4–10.4)
EGFR: >90 mL/min/{1.73_m2} (ref >90)
GLUCOSE: 89 mg/dL (ref 70–140)
Potassium: 4.8 mEq/L (ref 3.5–5.1)
Sodium: 142 mEq/L (ref 136–145)
TOTAL PROTEIN: 7.5 g/dL (ref 6.4–8.3)

## 2016-08-16 ENCOUNTER — Encounter (HOSPITAL_COMMUNITY): Payer: Self-pay | Admitting: *Deleted

## 2016-08-16 ENCOUNTER — Telehealth: Payer: Self-pay | Admitting: *Deleted

## 2016-08-16 NOTE — Progress Notes (Signed)
Pt denies SOB, chest pain, and being under the care of a cardiologist. Pt denies having a stress test, echo and cardiac cath. Pt denies having a chest x ray within the last year. Pt made aware to stop taking  Aspirin, vitamins, fish oil and herbal medications. Do not take any NSAIDs ie: Ibuprofen, Advil, Naproxen, BC and Goody Powder or any medication containing Aspirin. Pt verbalized understanding of all pre-op instructions. 

## 2016-08-16 NOTE — Telephone Encounter (Signed)
Oncology Nurse Navigator Documentation  Oncology Nurse Navigator Flowsheets 08/16/2016  Navigator Location [facility]  Navigator Encounter Type Telephone  Telephone Outgoing Call;Medication Assistance--Xeloda  Abnormal Finding Date -  Confirmed Diagnosis Date -  Surgery Date -  Patient Visit Type -  Treatment Phase -  Barriers/Navigation Needs -  Education -  Interventions -Called patient and left VM to call [facility] on 9/28 to f/u on status of her Xeloda [phone]  Coordination of Care -  Education Method -  Support Groups/Services -  Acuity -  Time Spent with Patient 15  Called [employer] to f/u on her Xeloda and was informed that script was sent to [facility]. Called [facility] and was told their system is down and will need to call tomorrow.

## 2016-08-17 ENCOUNTER — Ambulatory Visit (HOSPITAL_COMMUNITY): Payer: No Typology Code available for payment source

## 2016-08-17 ENCOUNTER — Ambulatory Visit (HOSPITAL_COMMUNITY)
Admission: RE | Admit: 2016-08-17 | Discharge: 2016-08-17 | Disposition: A | Discharge status: Home / Self Care | Payer: No Typology Code available for payment source | Source: Ambulatory Visit | Attending: General Surgery | Admitting: General Surgery

## 2016-08-17 ENCOUNTER — Encounter (HOSPITAL_COMMUNITY): Payer: Self-pay | Admitting: Anesthesiology

## 2016-08-17 ENCOUNTER — Ambulatory Visit (HOSPITAL_COMMUNITY): Payer: No Typology Code available for payment source | Admitting: Anesthesiology

## 2016-08-17 ENCOUNTER — Encounter (HOSPITAL_COMMUNITY)
Admission: RE | Disposition: A | Discharge status: Home / Self Care | Payer: Self-pay | Source: Ambulatory Visit | Attending: General Surgery

## 2016-08-17 DIAGNOSIS — Z833 Family history of diabetes mellitus: Secondary | ICD-10-CM | POA: Diagnosis not present

## 2016-08-17 DIAGNOSIS — Z882 Allergy status to sulfonamides status: Secondary | ICD-10-CM | POA: Insufficient documentation

## 2016-08-17 DIAGNOSIS — C187 Malignant neoplasm of sigmoid colon: Secondary | ICD-10-CM | POA: Insufficient documentation

## 2016-08-17 DIAGNOSIS — G43909 Migraine, unspecified, not intractable, without status migrainosus: Secondary | ICD-10-CM | POA: Diagnosis not present

## 2016-08-17 DIAGNOSIS — Z79899 Other long term (current) drug therapy: Secondary | ICD-10-CM | POA: Diagnosis not present

## 2016-08-17 DIAGNOSIS — Z9049 Acquired absence of other specified parts of digestive tract: Secondary | ICD-10-CM | POA: Diagnosis not present

## 2016-08-17 DIAGNOSIS — Z87442 Personal history of urinary calculi: Secondary | ICD-10-CM | POA: Diagnosis not present

## 2016-08-17 DIAGNOSIS — Z95828 Presence of other vascular implants and grafts: Secondary | ICD-10-CM

## 2016-08-17 DIAGNOSIS — Z8249 Family history of ischemic heart disease and other diseases of the circulatory system: Secondary | ICD-10-CM | POA: Insufficient documentation

## 2016-08-17 DIAGNOSIS — C189 Malignant neoplasm of colon, unspecified: Secondary | ICD-10-CM

## 2016-08-17 DIAGNOSIS — Z803 Family history of malignant neoplasm of breast: Secondary | ICD-10-CM | POA: Diagnosis not present

## 2016-08-17 DIAGNOSIS — Z801 Family history of malignant neoplasm of trachea, bronchus and lung: Secondary | ICD-10-CM | POA: Diagnosis not present

## 2016-08-17 HISTORY — PX: PORTACATH PLACEMENT: SHX2246

## 2016-08-17 SURGERY — INSERTION, TUNNELED CENTRAL VENOUS DEVICE, WITH PORT
Anesthesia: General | Site: Chest | Laterality: Right

## 2016-08-17 MED ORDER — DEXAMETHASONE SODIUM PHOSPHATE 4 MG/ML IJ SOLN
INTRAMUSCULAR | Status: DC | PRN
  Administered 2016-08-17: 4 mg via INTRAVENOUS

## 2016-08-17 MED ORDER — ACETAMINOPHEN 325 MG PO TABS
ORAL_TABLET | ORAL | Status: AC
  Filled 2016-08-17: qty 2

## 2016-08-17 MED ORDER — CEFAZOLIN SODIUM-DEXTROSE 2-4 GM/100ML-% IV SOLN
2.0000 g | INTRAVENOUS | Status: AC
  Administered 2016-08-17: 2 g via INTRAVENOUS
  Filled 2016-08-17: qty 100

## 2016-08-17 MED ORDER — FENTANYL CITRATE (PF) 100 MCG/2ML IJ SOLN
INTRAMUSCULAR | Status: AC
  Filled 2016-08-17: qty 4

## 2016-08-17 MED ORDER — PROPOFOL 10 MG/ML IV BOLUS
INTRAVENOUS | Status: DC | PRN
  Administered 2016-08-17: 120 mg via INTRAVENOUS

## 2016-08-17 MED ORDER — HEPARIN SOD (PORK) LOCK FLUSH 100 UNIT/ML IV SOLN
INTRAVENOUS | Status: AC
  Filled 2016-08-17: qty 5

## 2016-08-17 MED ORDER — DEXAMETHASONE SODIUM PHOSPHATE 10 MG/ML IJ SOLN
INTRAMUSCULAR | Status: AC
  Filled 2016-08-17: qty 1

## 2016-08-17 MED ORDER — PROPOFOL 10 MG/ML IV BOLUS
INTRAVENOUS | Status: AC
  Filled 2016-08-17: qty 20

## 2016-08-17 MED ORDER — ONDANSETRON HCL 4 MG/2ML IJ SOLN
INTRAMUSCULAR | Status: AC
  Filled 2016-08-17: qty 2

## 2016-08-17 MED ORDER — FENTANYL CITRATE (PF) 100 MCG/2ML IJ SOLN: 25.0000 ug | INTRAMUSCULAR | Status: DC | PRN

## 2016-08-17 MED ORDER — LACTATED RINGERS IV SOLN
INTRAVENOUS | Status: DC
  Administered 2016-08-17: 10:00:00 via INTRAVENOUS

## 2016-08-17 MED ORDER — MIDAZOLAM HCL 5 MG/5ML IJ SOLN
INTRAMUSCULAR | Status: DC | PRN
  Administered 2016-08-17: 2 mg via INTRAVENOUS

## 2016-08-17 MED ORDER — ONDANSETRON HCL 4 MG/2ML IJ SOLN
INTRAMUSCULAR | Status: DC | PRN
  Administered 2016-08-17: 4 mg via INTRAVENOUS

## 2016-08-17 MED ORDER — CHLORHEXIDINE GLUCONATE CLOTH 2 % EX PADS: 6.0000 | MEDICATED_PAD | Freq: Once | CUTANEOUS | Status: DC

## 2016-08-17 MED ORDER — FENTANYL CITRATE (PF) 100 MCG/2ML IJ SOLN
INTRAMUSCULAR | Status: DC | PRN
  Administered 2016-08-17 (×2): 50 ug via INTRAVENOUS

## 2016-08-17 MED ORDER — LIDOCAINE HCL (PF) 1 % IJ SOLN
INTRAMUSCULAR | Status: AC
  Filled 2016-08-17: qty 30

## 2016-08-17 MED ORDER — HEPARIN SOD (PORK) LOCK FLUSH 100 UNIT/ML IV SOLN
INTRAVENOUS | Status: DC | PRN
  Administered 2016-08-17: 250 [IU] via INTRAVENOUS

## 2016-08-17 MED ORDER — OXYCODONE HCL 5 MG PO TABS: 5.0000 mg | ORAL_TABLET | ORAL | Status: DC | PRN

## 2016-08-17 MED ORDER — 0.9 % SODIUM CHLORIDE (POUR BTL) OPTIME
TOPICAL | Status: DC | PRN
Start: 2016-08-17 — End: 2016-08-17
  Administered 2016-08-17: 1000 mL

## 2016-08-17 MED ORDER — LIDOCAINE HCL (PF) 1 % IJ SOLN
INTRAMUSCULAR | Status: DC | PRN
  Administered 2016-08-17: 8 mL

## 2016-08-17 MED ORDER — LIDOCAINE 2% (20 MG/ML) 5 ML SYRINGE
INTRAMUSCULAR | Status: AC
  Filled 2016-08-17: qty 5

## 2016-08-17 MED ORDER — PHENYLEPHRINE HCL 10 MG/ML IJ SOLN
INTRAMUSCULAR | Status: DC | PRN
  Administered 2016-08-17: 40 ug via INTRAVENOUS

## 2016-08-17 MED ORDER — ACETAMINOPHEN 325 MG PO TABS
650.0000 mg | ORAL_TABLET | ORAL | Status: DC | PRN
  Administered 2016-08-17: 650 mg via ORAL

## 2016-08-17 MED ORDER — SODIUM CHLORIDE 0.9 % IV SOLN
INTRAVENOUS | Status: DC | PRN
  Administered 2016-08-17: 500 mL

## 2016-08-17 MED ORDER — PROMETHAZINE HCL 25 MG/ML IJ SOLN: 6.2500 mg | INTRAMUSCULAR | Status: DC | PRN

## 2016-08-17 MED ORDER — ACETAMINOPHEN 650 MG RE SUPP
650.0000 mg | RECTAL | Status: DC | PRN
  Filled 2016-08-17: qty 1

## 2016-08-17 MED ORDER — LIDOCAINE HCL (CARDIAC) 20 MG/ML IV SOLN
INTRAVENOUS | Status: DC | PRN
  Administered 2016-08-17: 60 mg via INTRAVENOUS

## 2016-08-17 MED ORDER — MIDAZOLAM HCL 2 MG/2ML IJ SOLN
INTRAMUSCULAR | Status: AC
  Filled 2016-08-17: qty 2

## 2016-08-17 SURGICAL SUPPLY — 53 items
BAG DECANTER FOR FLEXI CONT (MISCELLANEOUS) ×3 IMPLANT
BENZOIN TINCTURE PRP APPL 2/3 (GAUZE/BANDAGES/DRESSINGS) ×3 IMPLANT
BLADE SURG 11 STRL SS (BLADE) ×3 IMPLANT
BLADE SURG 15 STRL LF DISP TIS (BLADE) ×1 IMPLANT
BLADE SURG 15 STRL SS (BLADE) ×2
CHLORAPREP W/TINT 26ML (MISCELLANEOUS) ×3 IMPLANT
CLOSURE WOUND 1/2 X4 (GAUZE/BANDAGES/DRESSINGS) ×1
COVER SURGICAL LIGHT HANDLE (MISCELLANEOUS) ×3 IMPLANT
COVER TRANSDUCER ULTRASND GEL (DRAPE) ×6 IMPLANT
CRADLE DONUT ADULT HEAD (MISCELLANEOUS) ×3 IMPLANT
DECANTER SPIKE VIAL GLASS SM (MISCELLANEOUS) ×3 IMPLANT
DRAPE C-ARM 42X72 X-RAY (DRAPES) ×3 IMPLANT
DRAPE CHEST BREAST 15X10 FENES (DRAPES) ×3 IMPLANT
DRAPE UTILITY XL STRL (DRAPES) ×3 IMPLANT
DRSG TEGADERM 2-3/8X2-3/4 SM (GAUZE/BANDAGES/DRESSINGS) ×3 IMPLANT
DRSG TEGADERM 4X4.75 (GAUZE/BANDAGES/DRESSINGS) ×3 IMPLANT
ELECT CAUTERY BLADE 6.4 (BLADE) ×3 IMPLANT
ELECT REM PT RETURN 9FT ADLT (ELECTROSURGICAL) ×3
ELECTRODE REM PT RTRN 9FT ADLT (ELECTROSURGICAL) ×1 IMPLANT
GAUZE SPONGE 2X2 8PLY STRL LF (GAUZE/BANDAGES/DRESSINGS) ×1 IMPLANT
GAUZE SPONGE 4X4 16PLY XRAY LF (GAUZE/BANDAGES/DRESSINGS) ×3 IMPLANT
GLOVE BIOGEL PI IND STRL 7.0 (GLOVE) ×3 IMPLANT
GLOVE BIOGEL PI IND STRL 8 (GLOVE) ×1 IMPLANT
GLOVE BIOGEL PI INDICATOR 7.0 (GLOVE) ×6
GLOVE BIOGEL PI INDICATOR 8 (GLOVE) ×2
GLOVE ECLIPSE 8.0 STRL XLNG CF (GLOVE) ×3 IMPLANT
GLOVE SURG SS PI 7.0 STRL IVOR (GLOVE) ×6 IMPLANT
GOWN STRL REUS W/ TWL LRG LVL3 (GOWN DISPOSABLE) ×3 IMPLANT
GOWN STRL REUS W/TWL LRG LVL3 (GOWN DISPOSABLE) ×6
INTRODUCER 13FR (MISCELLANEOUS) IMPLANT
INTRODUCER COOK 11FR (CATHETERS) IMPLANT
KIT BASIN OR (CUSTOM PROCEDURE TRAY) ×3 IMPLANT
KIT PORT POWER 8FR ISP CVUE (Catheter) ×3 IMPLANT
KIT ROOM TURNOVER OR (KITS) ×3 IMPLANT
NEEDLE HYPO 25GX1X1/2 BEV (NEEDLE) ×3 IMPLANT
NS IRRIG 1000ML POUR BTL (IV SOLUTION) ×3 IMPLANT
PACK SURGICAL SETUP 50X90 (CUSTOM PROCEDURE TRAY) ×3 IMPLANT
PAD ARMBOARD 7.5X6 YLW CONV (MISCELLANEOUS) ×3 IMPLANT
PENCIL BUTTON HOLSTER BLD 10FT (ELECTRODE) ×3 IMPLANT
SET INTRODUCER 12FR PACEMAKER (SHEATH) IMPLANT
SHEATH COOK PEEL AWAY SET 9F (SHEATH) IMPLANT
SPONGE GAUZE 2X2 STER 10/PKG (GAUZE/BANDAGES/DRESSINGS) ×2
STRIP CLOSURE SKIN 1/2X4 (GAUZE/BANDAGES/DRESSINGS) ×2 IMPLANT
SUT MON AB 4-0 PC3 18 (SUTURE) ×3 IMPLANT
SUT PROLENE 2 0 CT2 30 (SUTURE) ×6 IMPLANT
SUT PROLENE 2 0 SH DA (SUTURE) ×3 IMPLANT
SUT VIC AB 3-0 SH 27 (SUTURE) ×2
SUT VIC AB 3-0 SH 27X BRD (SUTURE) ×1 IMPLANT
SYR 20ML ECCENTRIC (SYRINGE) ×6 IMPLANT
SYR 5ML LUER SLIP (SYRINGE) ×3 IMPLANT
SYR CONTROL 10ML LL (SYRINGE) ×3 IMPLANT
TOWEL OR 17X24 6PK STRL BLUE (TOWEL DISPOSABLE) ×3 IMPLANT
TOWEL OR 17X26 10 PK STRL BLUE (TOWEL DISPOSABLE) ×3 IMPLANT

## 2016-08-17 NOTE — Transfer of Care (Signed)
Immediate Anesthesia Transfer of Care Note  Patient: Patricia Hudson  Procedure(s) Performed: Procedure(s): ULTRASOUND GUIDED INSERTION PORT-A-CATH RIGHT INTERNAL JUGULAR (Right)  Patient Location: PACU  Anesthesia Type:General  Level of Consciousness: awake, oriented and patient cooperative  Airway & Oxygen Therapy: Patient Spontanous Breathing and Patient connected to nasal cannula oxygen  Post-op Assessment: Report given to RN and Post -op Vital signs reviewed and stable  Post vital signs: Reviewed  Last Vitals:  Vitals:   08/17/16 0835 08/17/16 1204  BP: 111/83   Pulse: 81   Resp: 18   Temp: 36.9 C 36.6 C    Last Pain:  Vitals:   08/17/16 1204  TempSrc:   PainSc: 0-No pain      Patients Stated Pain Goal: 2 ([id number] [phone])  Complications: No apparent anesthesia complications

## 2016-08-17 NOTE — Anesthesia Procedure Notes (Signed)
Procedure Name: LMA Insertion Date/Time: 08/17/2016 11:00 AM Performed by: Luciana Axe K Pre-anesthesia Checklist: Patient identified, Emergency Drugs available, Suction available and Patient being monitored Patient Re-evaluated:Patient Re-evaluated prior to inductionOxygen Delivery Method: Circle System Utilized Preoxygenation: Pre-oxygenation with 100% oxygen Intubation Type: IV induction Ventilation: Mask ventilation without difficulty LMA: LMA inserted LMA Size: 3.0 Number of attempts: 1 Placement Confirmation: positive ETCO2 and breath sounds checked- equal and bilateral Tube secured with: Tape Dental Injury: Teeth and Oropharynx as per pre-operative assessment

## 2016-08-17 NOTE — Anesthesia Postprocedure Evaluation (Signed)
Anesthesia Post Note  Patient: Patricia Hudson  Procedure(s) Performed: Procedure(s) (LRB): ULTRASOUND GUIDED INSERTION PORT-A-CATH RIGHT INTERNAL JUGULAR (Right)  Patient location during evaluation: PACU Anesthesia Type: General Level of consciousness: awake and alert Pain management: pain level controlled Vital Signs Assessment: post-procedure vital signs reviewed and stable Respiratory status: spontaneous breathing, nonlabored ventilation, respiratory function stable and patient connected to nasal cannula oxygen Cardiovascular status: blood pressure returned to baseline and stable Postop Assessment: no signs of nausea or vomiting Anesthetic complications: no    Last Vitals:  Vitals:   08/17/16 1204 08/17/16 1217  BP:  106/72  Pulse:  76  Resp:  16  Temp: 36.6 C     Last Pain:  Vitals:   08/17/16 1204  TempSrc:   PainSc: 0-No pain                 Catalina Gravel

## 2016-08-17 NOTE — H&P (Signed)
Patricia Hudson is an 50 y.o. female.    HPI:  She has stage IV colon cancer and needs long term venous access for chemotherapy.  She is doing very well postop.  She is eating well, bowels are moving, no problems with her incisions.  She is doing some work.  Past Medical History:  Diagnosis Date  . Cancer [facility]) colon cancer   liver mass  . Headache    migraine  . Renal calculi 1994    Past Surgical History:  Procedure Laterality Date  . COLONOSCOPY    . COLONOSCOPY N/A 07/14/2016   Procedure: COLONOSCOPY;  Surgeon: Carol Ada, MD;  Location: [facility] ENDOSCOPY;  Service: Endoscopy;  Laterality: N/A;  . LAPAROSCOPIC LIVER CYST REMOVAL N/A 07/27/2016   Procedure: REMOVAL OF LIVER MASS;  Surgeon: Jackolyn Confer, MD;  Location: [facility] ORS;  Service: General;  Laterality: N/A;  . LAPAROSCOPIC PARTIAL COLECTOMY N/A 07/27/2016   Procedure: LAPAROSCOPIC PARTIAL COLECTOMY MOBILIZATION OF SPLENIC FLEXURE LAPAROSCOPIC SEGMENTAL LIVER RESECTION OF SEGMENT 8 OF LIVER;  Surgeon: Jackolyn Confer, MD;  Location: [facility] ORS;  Service: General;  Laterality: N/A;  . TONSILLECTOMY AND ADENOIDECTOMY  1970's    Family History  Problem Relation Age of Onset  . Diabetes Father   . Hypertension Father   . Lung cancer Father 29  . Breast cancer Mother 102    tripple negative.BRCA Neg  . Breast cancer Maternal Grandmother 60    double mastectomy  . Breast cancer Paternal Aunt 35   Social History:  reports that she has never smoked. She has never used smokeless tobacco. She reports that she does not drink alcohol or use drugs.  Allergies:  Allergies  Allergen Reactions  . Sulfa Antibiotics Other (See Comments)    Kathreen Cosier Syndrome    Medications Prior to Admission  Medication Sig Dispense Refill  . cholecalciferol (VITAMIN D) 400 units TABS tablet Take 400 Units by mouth daily.    . Multiple Vitamin (MULTIVITAMIN WITH MINERALS) TABS tablet Take 1 tablet by mouth daily.    Marland Kitchen acetaminophen (TYLENOL) 325 MG  tablet Take 650 mg by mouth daily as needed for mild pain or headache.    Derrill Memo ON 08/22/2016] capecitabine (XELODA) 500 MG tablet Take 4 tablets (2077m) in the morning and 3 tablets (15073m in the evening. Take after meals on days 1-14 of every 21 day cycle 98 tablet 0  . lidocaine-prilocaine (EMLA) cream Apply 1 application topically as needed. Apply to port site 1 hour prior to stick and cover with plastic wrap 30 g 11  . ondansetron (ZOFRAN) 8 MG tablet Take 1 tablet (8 mg total) by mouth 2 (two) times daily as needed for nausea or vomiting. 20 tablet 2  . prochlorperazine (COMPAZINE) 10 MG tablet Take 1 tablet (10 mg total) by mouth every 6 (six) hours as needed for nausea or vomiting. 60 tablet 1    Results for orders placed or performed in visit on 08/15/16 (from the past 48 hour(s))  Comprehensive metabolic panel     Status: Abnormal   Collection Time: 08/15/16 11:36 AM  Result Value Ref Range   Sodium 142 136 - 145 mEq/L   Potassium 4.8 3.5 - 5.1 mEq/L   Chloride 108 98 - 109 mEq/L   CO2 23 22 - 29 mEq/L   Glucose 89 70 - 140 mg/dl    Comment: Glucose reference range is for nonfasting patients. Fasting glucose reference range is 70- 100.   BUN 11.3 7.0 -  26.0 mg/dL   Creatinine 0.8 0.6 - 1.1 mg/dL   Total Bilirubin 0.33 0.20 - 1.20 mg/dL   Alkaline Phosphatase 95 40 - 150 U/L   AST 45 (H) 5 - 34 U/L   ALT 45 0 - 55 U/L   Total Protein 7.5 6.4 - 8.3 g/dL   Albumin 4.0 3.5 - 5.0 g/dL   Calcium 10.1 8.4 - 10.4 mg/dL   Anion Gap 11 3 - 11 mEq/L   EGFR >90 >90 ml/min/1.73 m2    Comment: eGFR is calculated using the CKD-EPI Creatinine Equation (2009)  CBC with Differential     Status: None   Collection Time: 08/15/16 11:36 AM  Result Value Ref Range   WBC 4.1 3.9 - 10.3 10e3/uL   NEUT# 2.3 1.5 - 6.5 10e3/uL   HGB 12.1 11.6 - 15.9 g/dL   HCT 37.2 34.8 - 46.6 %   Platelets 279 145 - 400 10e3/uL   MCV 94.5 79.5 - 101.0 fL   MCH 30.7 25.1 - 34.0 pg   MCHC 32.4 31.5 - 36.0  g/dL   RBC 3.93 3.70 - 5.45 10e6/uL   RDW 14.1 11.2 - 14.5 %   lymph# 1.2 0.9 - 3.3 10e3/uL   MONO# 0.4 0.1 - 0.9 10e3/uL   Eosinophils Absolute 0.2 0.0 - 0.5 10e3/uL   Basophils Absolute 0.1 0.0 - 0.1 10e3/uL   NEUT% 56.2 38.4 - 76.8 %   LYMPH% 27.9 14.0 - 49.7 %   MONO% 10.6 0.0 - 14.0 %   EOS% 4.0 0.0 - 7.0 %   BASO% 1.3 0.0 - 2.0 %   No results found.  Review of Systems  Constitutional: Negative for fever.  Gastrointestinal: Negative for abdominal pain, constipation and diarrhea.    Blood pressure 111/83, pulse 81, temperature 98.4 F (36.9 C), temperature source Oral, resp. rate 18, height _0  (1.727 m), weight 59.1 kg (130 lb 3.2 oz), last menstrual period 04/20/2013, SpO2 100 %. Physical Exam  Constitutional: She appears well-developed and well-nourished. No distress.  HENT:  Head: Normocephalic and atraumatic.  Respiratory: Effort normal and breath sounds normal.  GI: Soft.  Incisions are all clean, dry and intact.  Neurological: She is alert.  Skin: Skin is warm and dry.  Psychiatric: She has a normal mood and affect. Her behavior is normal.     Assessment/Plan Stage IV colon cancer.  Plan:  US guided Port-a-cath insertion. The procedure risks and aftercare been explained. Risks include but are not limited to bleeding, infection, malfunction, pneumothorax, wound problems, DVT.  [name], MD 08/17/2016, 10:41 AM

## 2016-08-17 NOTE — Anesthesia Preprocedure Evaluation (Addendum)
Anesthesia Evaluation  Patient identified by MRN, date of birth, ID band Patient awake    Reviewed: Allergy & Precautions, NPO status , Patient's Chart, lab work & pertinent test results  Airway Mallampati: I  TM Distance: >3 FB Neck ROM: Full    Dental  (+) Teeth Intact, Dental Advisory Given   Pulmonary neg pulmonary ROS,    Pulmonary exam normal breath sounds clear to auscultation       Cardiovascular Exercise Tolerance: Good negative cardio ROS Normal cardiovascular exam Rhythm:Regular Rate:Normal     Neuro/Psych  Headaches, negative psych ROS   GI/Hepatic Neg liver ROS, Liver mass Colon cancer colectomy and resection of an isolated liver metastasis on 07/27/2016   Endo/Other  negative endocrine ROS  Renal/GU negative Renal ROS     Musculoskeletal negative musculoskeletal ROS (+)   Abdominal   Peds  Hematology negative hematology ROS (+)   Anesthesia Other Findings Day of surgery medications reviewed with the patient.  Reproductive/Obstetrics negative OB ROS                            Anesthesia Physical Anesthesia Plan  ASA: III  Anesthesia Plan: General   Post-op Pain Management:    Induction: Intravenous  Airway Management Planned: LMA  Additional Equipment:   Intra-op Plan:   Post-operative Plan: Extubation in OR  Informed Consent: I have reviewed the patients History and Physical, chart, labs and discussed the procedure including the risks, benefits and alternatives for the proposed anesthesia with the patient or authorized representative who has indicated his/her understanding and acceptance.   Dental advisory given  Plan Discussed with: CRNA  Anesthesia Plan Comments: (Risks/benefits of general anesthesia discussed with patient including risk of damage to teeth, lips, gum, and tongue, nausea/vomiting, allergic reactions to medications, and the possibility of  heart attack, stroke and death.  All patient questions answered.  Patient wishes to proceed.)        Anesthesia Quick Evaluation

## 2016-08-17 NOTE — Op Note (Signed)
OPERATIVE NOTE- Porta-a-cath insertion  Preoperative diagnosis:  Stage IV colon cancer  Postoperative diagnosis:  Same  Procedure: Ultrasound-guided Port-A-Cath insertion into right internal jugular vein under fluoroscopy.  Surgeon: Jackolyn Confer M.D.  Anesthesia: General/LMA with local (Xylocaine).  Indication:  This is a 50 year old female with colon cancer who needs long term venous access for chemotherapy.  Technique: She was brought to the operating room, placed supine on the operating table, and intravenous sedation was given. A roll was placed under the back and the arms were tucked.  The upper chest wall and neck were sterilely prepped and draped.  A timeout was performed.  Her head was rotated to the left. Using the ultrasound, the right internal jugular vein was identified. Local anesthetic was infiltrated in the skin and subcutaneous tissue just anterior to it. A 16-gauge needle was used to cannulate the right internal jugular vein under ultrasound guidance. A wire was then threaded through the needle into the internal jugular vein and down into the right heart under ultrasound and fluoroscopic guidance.  Local anesthetic was infiltrated into the right upper chest wall. A right upper chest wall incision was made and a pocket was created for the Portacath.  An incision was made around the wire in the neck. The catheter was then tunneled from the chest wall incision up through the neck incision.  A dilator- introducer complex was placed over the wire into the superior vena cava. The dilator and wire were then removed and the catheter was threaded through the peel-away sheath introducer into the right heart. The introducer was then peeled away and removed. Under fluoroscopic guidance, the tip of the catheter was then pulled back until it was at the junction of the superior vena cava and right atrium. The catheter was then connected to the port.  The port aspirated blood and flushed  easily.  The port was then anchored to the chest wall with 2-0 Prolene suture. Concentrated heparin solution was then placed into the port. The port and catheter position were then verified using fluoroscopy. The subcutaneous tissue was then closed over the port with running 3-0 Vicryl suture. The skin incisions were then closed with 4-0 Monocryl subcuticular stitches. Steri-Strips and sterile dressings were applied.  The procedure was well-tolerated without any apparent complications. She was taken to the recovery room in satisfactory condition where a portable chest x-ray is pending.

## 2016-08-17 NOTE — Interval H&P Note (Signed)
History and Physical Interval Note:  08/17/2016 10:48 AM  Patricia Hudson  has presented today for surgery, with the diagnosis of COLON CANCER  The various methods of treatment have been discussed with the patient and family. After consideration of risks, benefits and other options for treatment, the patient has consented to  Procedure(s): US GUIDED INSERTION PORT-A-CATH X SUBCLAVIAN (N/A) as a surgical intervention .  The patient's history has been reviewed, patient examined, no change in status, stable for surgery.  I have reviewed the patient's chart and labs.  Questions were answered to the patient's satisfaction.     Pahoua Schreiner Lenna Sciara

## 2016-08-17 NOTE — Discharge Instructions (Addendum)
° ° °  PORT-A-CATH: POST OP INSTRUCTIONS  Always review your discharge instruction sheet given to you by the facility where your surgery was performed.   1. A prescription for pain medication may be given to you upon discharge. Take your pain medication as prescribed, if needed. If narcotic pain medicine is not needed, then you make take acetaminophen (Tylenol) or ibuprofen (Advil) as needed.  2. Take your usually prescribed medications unless otherwise directed. 3. If you need a refill on your pain medication, please contact our office. All narcotic pain medicine now requires a paper prescription.  Phoned in and fax refills are no longer allowed by law.  Prescriptions will not be filled after 5 pm or on weekends.  4. You should follow a light diet for the remainder of the day after your procedure. 5. Most patients will experience some mild swelling and/or bruising in the area of the incision. It may take several days to resolve. 6. It is common to experience some constipation if taking pain medication after surgery. Increasing fluid intake and taking a stool softener (such as Colace) will usually help or prevent this problem from occurring. A mild laxative (Milk of Magnesia or Miralax) should be taken according to package directions if there are no bowel movements after 48 hours.  7. Unless discharge instructions indicate otherwise, you may remove your bandages 72 hours after surgery, and you may shower at that time. You may have steri-strips (small white skin tapes) in place directly over the incision.  These strips should be left on the skin.  If your surgeon used Dermabond (skin glue) on the incision, you may shower in 24 hours.  The glue will flake off over the next 2-3 weeks.  8. If your port is left accessed at the end of surgery (needle left in port), the dressing cannot get wet and should only by changed by a healthcare professional. When the port is no longer accessed (when the needle has been  removed), follow step 7.   9. ACTIVITIES:  Limit activity involving your right arm  for 1 week. You may drive when you are no longer taking prescription pain medication, you can comfortably wear a seatbelt, and you can maneuver your car. 10.You may need to see your doctor in the office for a follow-up appointment in mid October.  Please       check with your doctor.  11.When you receive a new Port-a-Cath, you will get a product guide and        ID card.  Please keep them in case you need them.  WHEN TO CALL YOUR DOCTOR [phone]): 1. Fever over 101.0 2. Chills 3. Continued bleeding from incision 4. Increased redness and tenderness at the site 5. Shortness of breath, difficulty breathing   The clinic staff is available to answer your questions during regular business hours. Please dont hesitate to call and ask to speak to one of the nurses or medical assistants for clinical concerns. If you have a medical emergency, go to the nearest emergency room or call 911.  A surgeon from [facility] is always on call at the hospital.     For further information, please visit [email or web address]

## 2016-08-18 ENCOUNTER — Telehealth: Payer: Self-pay | Admitting: Pharmacist

## 2016-08-18 ENCOUNTER — Telehealth: Payer: Self-pay | Admitting: *Deleted

## 2016-08-18 ENCOUNTER — Encounter (HOSPITAL_COMMUNITY): Payer: Self-pay | Admitting: General Surgery

## 2016-08-18 NOTE — Telephone Encounter (Signed)
VM from patient that she has called [facility] several times and at her last conversation with them she was told that they still needed information from our office. Will alert oral chemo pharmacist to contact pharmacy to determine what is still needed to process this script for Xeloda.

## 2016-08-18 NOTE — Telephone Encounter (Signed)
Oral Chemotherapy Pharmacist Encounter  Received fax from [place], [place] from [email or web address] requested PA request be completed. PA form completed online and submitted. [place]  I am not sure which PA this is in reference to, but I went ahead and completed it just in case it could help decrease delay in getting Xeloda to patient.  Oral Chemo Clinic will continue to follow.  Johny Drilling, PharmD, BCPS 08/18/2016  9:38 AM Oral Chemotherapy Clinic [phone]

## 2016-08-18 NOTE — Telephone Encounter (Signed)
Oral Chemotherapy Pharmacist Encounter   Received notification from Merceda Elks, RN, GI navigator, that patient had contacted [facility] for status of her Xeloda prescription and was told they were waiting on additional information from our office in order to process her prescription.  Note Xeloda prescription was sent to [facility] on 9/25 from [facility] per her insurance.  Oral Chemo Clinic called [facility] on 9/26 and prescription was under pharmacist review. Oral Chemo Clinic called [facility] again on 9/27 and their system was down so unable to provide any information about the status of the prescription. Patient called [facility] on 9/28 and received the above information.  I called [facility] this morning and was told patient has an approved prior authorization for Xeloda and the delay was not due to insurance. I was transferred to the oncology pharmacy department. I was told that patient's Xeloda was still needing PA. While I held on the phone the pharmacy from [place] contacted patient's insurance and was told PA was still pending. They was an approved PA this week that had been cancelled and another PA submitted. No additional information was able to be provided to me about the timeline or what was happening with the PA's.  This information relayed to Manuela Schwartz. The pharmacy knows to call the office with any questions.  Johny Drilling, PharmD, BCPS 08/18/2016  9:03 AM Oral Chemotherapy Clinic [phone]

## 2016-08-20 ENCOUNTER — Other Ambulatory Visit: Payer: Self-pay | Admitting: Oncology

## 2016-08-21 ENCOUNTER — Telehealth: Payer: Self-pay | Admitting: Pharmacist

## 2016-08-21 NOTE — Telephone Encounter (Signed)
I received fax from [place] stating pts request for Xeloda has been approved from 08/11/16 - 08/11/17. I contacted [facility] and they stated pt received her Xeloda by mail delivery on 08/19/16. I called pt and she confirmed receipt of her Xeloda and will start tx tomorrow (10/3). She had no questions about her Xeloda. I clarified the drug info request re: risk of SJS w/ Xeloda and she understands this is low incidence/rare. She will get Imodium today in case she develops diarrhea.  We'll contact her in the next 7-10 days to see if she is tolerating tx.  Kennith Center, Pharm.D., CPP 08/21/2016@12 :[facility]

## 2016-08-22 ENCOUNTER — Encounter: Payer: Self-pay | Admitting: *Deleted

## 2016-08-22 ENCOUNTER — Ambulatory Visit (HOSPITAL_BASED_OUTPATIENT_CLINIC_OR_DEPARTMENT_OTHER): Payer: No Typology Code available for payment source

## 2016-08-22 VITALS — BP 113/83 | HR 90 | Temp 98.2°F | Resp 18

## 2016-08-22 DIAGNOSIS — Z5111 Encounter for antineoplastic chemotherapy: Secondary | ICD-10-CM | POA: Diagnosis not present

## 2016-08-22 DIAGNOSIS — C187 Malignant neoplasm of sigmoid colon: Secondary | ICD-10-CM

## 2016-08-22 DIAGNOSIS — C787 Secondary malignant neoplasm of liver and intrahepatic bile duct: Secondary | ICD-10-CM | POA: Diagnosis not present

## 2016-08-22 MED ORDER — SODIUM CHLORIDE 0.9% FLUSH
10.0000 mL | INTRAVENOUS | Status: DC | PRN
  Administered 2016-08-22: 10 mL
  Filled 2016-08-22: qty 10

## 2016-08-22 MED ORDER — DEXAMETHASONE SODIUM PHOSPHATE 100 MG/10ML IJ SOLN
10.0000 mg | Freq: Once | INTRAMUSCULAR | Status: AC
  Administered 2016-08-22: 10 mg via INTRAVENOUS
  Filled 2016-08-22: qty 1

## 2016-08-22 MED ORDER — PALONOSETRON HCL INJECTION 0.25 MG/5ML
INTRAVENOUS | Status: AC
Start: 2016-08-22 — End: 2016-08-22
  Filled 2016-08-22: qty 5

## 2016-08-22 MED ORDER — DEXTROSE 5 % IV SOLN
Freq: Once | INTRAVENOUS | Status: AC
  Administered 2016-08-22: 14:00:00 via INTRAVENOUS

## 2016-08-22 MED ORDER — PALONOSETRON HCL INJECTION 0.25 MG/5ML
0.2500 mg | Freq: Once | INTRAVENOUS | Status: AC
  Administered 2016-08-22: 0.25 mg via INTRAVENOUS

## 2016-08-22 MED ORDER — DEXTROSE 5 % IV SOLN
130.0000 mg/m2 | Freq: Once | INTRAVENOUS | Status: AC
  Administered 2016-08-22: 220 mg via INTRAVENOUS
  Filled 2016-08-22: qty 40

## 2016-08-22 MED ORDER — HEPARIN SOD (PORK) LOCK FLUSH 100 UNIT/ML IV SOLN
500.0000 [IU] | Freq: Once | INTRAVENOUS | Status: AC | PRN
  Administered 2016-08-22: 500 [IU]
  Filled 2016-08-22: qty 5

## 2016-08-22 NOTE — Patient Instructions (Addendum)
[facility] Discharge Instructions for Patients Receiving Chemotherapy  Today you received the following chemotherapy agents Oxaliplatin. To help prevent nausea and vomiting after your treatment, we encourage you to take your nausea medication as directed BUT NO ZOFRAN FOR 3 DAYS TAKE COMPAZINE INSTEAD FOR THAT TIME.    If you develop nausea and vomiting that is not controlled by your nausea medication, call the clinic.   BELOW ARE SYMPTOMS THAT SHOULD BE REPORTED IMMEDIATELY:  *FEVER GREATER THAN 100.5 F  *CHILLS WITH OR WITHOUT FEVER  NAUSEA AND VOMITING THAT IS NOT CONTROLLED WITH YOUR NAUSEA MEDICATION  *UNUSUAL SHORTNESS OF BREATH  *UNUSUAL BRUISING OR BLEEDING  TENDERNESS IN MOUTH AND THROAT WITH OR WITHOUT PRESENCE OF ULCERS  *URINARY PROBLEMS  *BOWEL PROBLEMS  UNUSUAL RASH Items with * indicate a potential emergency and should be followed up as soon as possible.  Feel free to call the clinic you have any questions or concerns. The clinic phone number is [phone].  Please show the [facility] at check-in to the Emergency Department and triage nurse.   Oxaliplatin Injection What is this medicine? OXALIPLATIN (ox AL i PLA tin) is a chemotherapy drug. It targets fast dividing cells, like cancer cells, and causes these cells to die. This medicine is used to treat cancers of the colon and rectum, and many other cancers. This medicine may be used for other purposes; ask your health care provider or pharmacist if you have questions. What should I tell my health care provider before I take this medicine? They need to know if you have any of these conditions: -kidney disease -an unusual or allergic reaction to oxaliplatin, other chemotherapy, other medicines, foods, dyes, or preservatives -pregnant or trying to get pregnant -breast-feeding How should I use this medicine? This drug is given as an infusion into a vein. It is administered in a hospital  or clinic by a specially trained health care professional. Talk to your pediatrician regarding the use of this medicine in children. Special care may be needed. Overdosage: If you think you have taken too much of this medicine contact a poison control center or emergency room at once. NOTE: This medicine is only for you. Do not share this medicine with others. What if I miss a dose? It is important not to miss a dose. Call your doctor or health care professional if you are unable to keep an appointment. What may interact with this medicine? -medicines to increase blood counts like filgrastim, pegfilgrastim, sargramostim -probenecid -some antibiotics like amikacin, gentamicin, neomycin, polymyxin B, streptomycin, tobramycin -zalcitabine Talk to your doctor or health care professional before taking any of these medicines: -acetaminophen -aspirin -ibuprofen -ketoprofen -naproxen This list may not describe all possible interactions. Give your health care provider a list of all the medicines, herbs, non-prescription drugs, or dietary supplements you use. Also tell them if you smoke, drink alcohol, or use illegal drugs. Some items may interact with your medicine. What should I watch for while using this medicine? Your condition will be monitored carefully while you are receiving this medicine. You will need important blood work done while you are taking this medicine. This medicine can make you more sensitive to cold. Do not drink cold drinks or use ice. Cover exposed skin before coming in contact with cold temperatures or cold objects. When out in cold weather wear warm clothing and cover your mouth and nose to warm the air that goes into your lungs. Tell your doctor if you  get sensitive to the cold. This drug may make you feel generally unwell. This is not uncommon, as chemotherapy can affect healthy cells as well as cancer cells. Report any side effects. Continue your course of treatment even  though you feel ill unless your doctor tells you to stop. In some cases, you may be given additional medicines to help with side effects. Follow all directions for their use. Call your doctor or health care professional for advice if you get a fever, chills or sore throat, or other symptoms of a cold or flu. Do not treat yourself. This drug decreases your body's ability to fight infections. Try to avoid being around people who are sick. This medicine may increase your risk to bruise or bleed. Call your doctor or health care professional if you notice any unusual bleeding. Be careful brushing and flossing your teeth or using a toothpick because you may get an infection or bleed more easily. If you have any dental work done, tell your dentist you are receiving this medicine. Avoid taking products that contain aspirin, acetaminophen, ibuprofen, naproxen, or ketoprofen unless instructed by your doctor. These medicines may hide a fever. Do not become pregnant while taking this medicine. Women should inform their doctor if they wish to become pregnant or think they might be pregnant. There is a potential for serious side effects to an unborn child. Talk to your health care professional or pharmacist for more information. Do not breast-feed an infant while taking this medicine. Call your doctor or health care professional if you get diarrhea. Do not treat yourself. What side effects may I notice from receiving this medicine? Side effects that you should report to your doctor or health care professional as soon as possible: -allergic reactions like skin rash, itching or hives, swelling of the face, lips, or tongue -low blood counts - This drug may decrease the number of white blood cells, red blood cells and platelets. You may be at increased risk for infections and bleeding. -signs of infection - fever or chills, cough, sore throat, pain or difficulty passing urine -signs of decreased platelets or bleeding -  bruising, pinpoint red spots on the skin, black, tarry stools, nosebleeds -signs of decreased red blood cells - unusually weak or tired, fainting spells, lightheadedness -breathing problems -chest pain, pressure -cough -diarrhea -jaw tightness -mouth sores -nausea and vomiting -pain, swelling, redness or irritation at the injection site -pain, tingling, numbness in the hands or feet -problems with balance, talking, walking -redness, blistering, peeling or loosening of the skin, including inside the mouth -trouble passing urine or change in the amount of urine Side effects that usually do not require medical attention (report to your doctor or health care professional if they continue or are bothersome): -changes in vision -constipation -hair loss -loss of appetite -metallic taste in the mouth or changes in taste -stomach pain This list may not describe all possible side effects. Call your doctor for medical advice about side effects. You may report side effects to FDA at 1-800-FDA-1088. Where should I keep my medicine? This drug is given in a hospital or clinic and will not be stored at home. NOTE: This sheet is a summary. It may not cover all possible information. If you have questions about this medicine, talk to your doctor, pharmacist, or health care provider.    2016, Elsevier/Gold Standard. (2008-06-02 17:22:47)

## 2016-08-23 ENCOUNTER — Telehealth: Payer: Self-pay | Admitting: *Deleted

## 2016-08-23 NOTE — Telephone Encounter (Signed)
-----   Message from Flo Shanks, RN sent at 08/22/2016  2:41 PM EDT ----- Regarding: First time chemo/ Dr. Benay Spice First time chemo/ Oxaliplatin

## 2016-08-23 NOTE — Telephone Encounter (Signed)
Attempted to call pt for chemo follow up. Voicemail is full.

## 2016-08-25 ENCOUNTER — Telehealth: Payer: Self-pay

## 2016-08-25 NOTE — Telephone Encounter (Signed)
Faxed paperwork to [employer]

## 2016-08-28 ENCOUNTER — Telehealth: Payer: Self-pay | Admitting: *Deleted

## 2016-08-28 NOTE — Telephone Encounter (Signed)
Per staff message I have moved treatment appt from 10/23 to 10/24

## 2016-08-30 ENCOUNTER — Other Ambulatory Visit: Payer: Self-pay | Admitting: *Deleted

## 2016-08-30 DIAGNOSIS — C187 Malignant neoplasm of sigmoid colon: Secondary | ICD-10-CM

## 2016-08-30 MED ORDER — CAPECITABINE 500 MG PO TABS: ORAL_TABLET | ORAL | 0 refills | Status: DC

## 2016-08-31 ENCOUNTER — Encounter (HOSPITAL_COMMUNITY): Payer: Self-pay

## 2016-09-05 ENCOUNTER — Other Ambulatory Visit: Payer: Self-pay | Admitting: *Deleted

## 2016-09-05 DIAGNOSIS — C187 Malignant neoplasm of sigmoid colon: Secondary | ICD-10-CM

## 2016-09-05 MED ORDER — CAPECITABINE 500 MG PO TABS: ORAL_TABLET | ORAL | 0 refills | Status: DC

## 2016-09-10 ENCOUNTER — Other Ambulatory Visit: Payer: Self-pay | Admitting: Oncology

## 2016-09-11 ENCOUNTER — Telehealth: Payer: Self-pay | Admitting: Oncology

## 2016-09-11 ENCOUNTER — Encounter: Payer: Self-pay | Admitting: *Deleted

## 2016-09-11 ENCOUNTER — Other Ambulatory Visit (HOSPITAL_BASED_OUTPATIENT_CLINIC_OR_DEPARTMENT_OTHER): Payer: PRIVATE HEALTH INSURANCE

## 2016-09-11 ENCOUNTER — Ambulatory Visit (HOSPITAL_BASED_OUTPATIENT_CLINIC_OR_DEPARTMENT_OTHER): Payer: PRIVATE HEALTH INSURANCE | Admitting: Oncology

## 2016-09-11 ENCOUNTER — Ambulatory Visit (HOSPITAL_BASED_OUTPATIENT_CLINIC_OR_DEPARTMENT_OTHER): Payer: PRIVATE HEALTH INSURANCE

## 2016-09-11 ENCOUNTER — Ambulatory Visit: Payer: PRIVATE HEALTH INSURANCE

## 2016-09-11 VITALS — BP 126/72 | HR 77 | Temp 98.0°F | Resp 17 | Ht 68.0 in | Wt 132.1 lb

## 2016-09-11 DIAGNOSIS — C187 Malignant neoplasm of sigmoid colon: Secondary | ICD-10-CM

## 2016-09-11 DIAGNOSIS — R110 Nausea: Secondary | ICD-10-CM | POA: Diagnosis not present

## 2016-09-11 DIAGNOSIS — F329 Major depressive disorder, single episode, unspecified: Secondary | ICD-10-CM | POA: Diagnosis not present

## 2016-09-11 LAB — CBC WITH DIFFERENTIAL/PLATELET
BASO%: 1 % (ref 0.0–2.0)
BASOS ABS: 0 10*3/uL (ref 0.0–0.1)
EOS ABS: 0.3 10*3/uL (ref 0.0–0.5)
EOS%: 8.4 % — AB (ref 0.0–7.0)
HEMATOCRIT: 34.7 % — AB (ref 34.8–46.6)
HEMOGLOBIN: 11.5 g/dL — AB (ref 11.6–15.9)
LYMPH#: 0.7 10*3/uL — AB (ref 0.9–3.3)
LYMPH%: 21.1 % (ref 14.0–49.7)
MCH: 31.1 pg (ref 25.1–34.0)
MCHC: 33.1 g/dL (ref 31.5–36.0)
MCV: 93.9 fL (ref 79.5–101.0)
MONO#: 0.6 10*3/uL (ref 0.1–0.9)
MONO%: 17.4 % — AB (ref 0.0–14.0)
NEUT%: 52.1 % (ref 38.4–76.8)
NEUTROS ABS: 1.7 10*3/uL (ref 1.5–6.5)
Platelets: 181 10*3/uL (ref 145–400)
RBC: 3.69 10*6/uL — ABNORMAL LOW (ref 3.70–5.45)
RDW: 13.8 % (ref 11.2–14.5)
WBC: 3.2 10*3/uL — AB (ref 3.9–10.3)

## 2016-09-11 LAB — COMPREHENSIVE METABOLIC PANEL
ALBUMIN: 3.8 g/dL (ref 3.5–5.0)
ALK PHOS: 86 U/L (ref 40–150)
ALT: 23 U/L (ref 0–55)
AST: 26 U/L (ref 5–34)
Anion Gap: 10 mEq/L (ref 3–11)
BILIRUBIN TOTAL: 0.22 mg/dL (ref 0.20–1.20)
BUN: 12.7 mg/dL (ref 7.0–26.0)
CALCIUM: 9.4 mg/dL (ref 8.4–10.4)
CO2: 23 mEq/L (ref 22–29)
Chloride: 108 mEq/L (ref 98–109)
Creatinine: 0.7 mg/dL (ref 0.6–1.1)
EGFR: >90 mL/min/{1.73_m2} (ref >90)
GLUCOSE: 102 mg/dL (ref 70–140)
Potassium: 4.1 mEq/L (ref 3.5–5.1)
SODIUM: 141 meq/L (ref 136–145)
TOTAL PROTEIN: 6.6 g/dL (ref 6.4–8.3)

## 2016-09-11 MED ORDER — ASPIRIN EC 81 MG PO TBEC: 81.0000 mg | DELAYED_RELEASE_TABLET | Freq: Every day | ORAL | Status: DC

## 2016-09-11 MED ORDER — SODIUM CHLORIDE 0.9% FLUSH
10.0000 mL | INTRAVENOUS | Status: DC | PRN
  Administered 2016-09-11: 10 mL via INTRAVENOUS
  Filled 2016-09-11: qty 10

## 2016-09-11 MED ORDER — HEPARIN SOD (PORK) LOCK FLUSH 100 UNIT/ML IV SOLN
500.0000 [IU] | Freq: Once | INTRAVENOUS | Status: AC | PRN
  Administered 2016-09-11: 500 [IU] via INTRAVENOUS
  Filled 2016-09-11: qty 5

## 2016-09-11 MED ORDER — ESCITALOPRAM OXALATE 5 MG PO TABS: 5.0000 mg | ORAL_TABLET | Freq: Every day | ORAL | 0 refills | Status: DC

## 2016-09-11 NOTE — Telephone Encounter (Signed)
Message sent to chemo scheduler to be added. AVS report and appointment schedule, given to patient per 09/11/16 los.

## 2016-09-11 NOTE — Progress Notes (Signed)
Patricia Hudson mother isn't with Patricia Hudson mother-in-law cosigned zoster in order to [place] OFFICE PROGRESS NOTE   Diagnosis: Colon cancer  INTERVAL HISTORY:   Patricia Hudson returns as scheduled. She completed cycle 1 CAPOX beginning 08/22/2016. She had several episodes of acute emesis on days 3 and 4 following chemotherapy. Cold sensitivity lasted for one week. No other neurologic symptoms. No mouth sores, diarrhea, or hand/foot pain. She is working. She had no further nausea after the episodes of emesis on day 4. She reports feeling depressed. Objective:  Vital signs in last 24 hours:  Blood pressure 126/72, pulse 77, temperature 98 F (36.7 C), temperature source Oral, resp. rate 17, height '5\' 8"'  (1.727 m), weight 132 lb 1.6 oz (59.9 kg), last menstrual period 04/20/2013, SpO2 100 %.    HEENT: No thrush or ulcers Resp: Lungs clear bilaterally Cardio: Regular rate and rhythm GI: No hepatosplenomegaly, healed surgical incision, nontender Vascular: No leg edema  Skin: Palms and soles without erythema or skin breakdown   Portacath/PICC-without erythema  Lab Results:  Lab Results  Component Value Date   WBC 3.2 (L) 09/11/2016   HGB 11.5 (L) 09/11/2016   HCT 34.7 (L) 09/11/2016   MCV 93.9 09/11/2016   PLT 181 09/11/2016   NEUTROABS 1.7 09/11/2016     Medications: I have reviewed the patient's current medications.  Assessment/Plan: 1. Adenocarcinoma of the sigmoid colon, status post a colonoscopic biopsy 07/07/2016  Mismatch repair protein expression-normal ? Sigmoid mass noted at 20 cm from the anal verge ? Staging CTs of the chest, abdomen, and pelvis negative for evidence of metastatic disease other than an indeterminate 10 mm right liver lesion ? MRI of the abdomen 07/07/2016 revealed multiple bilateral hepatic cyst. 7 mm inferior subcapsular right liver lesion suspicious for a metastasis ? PET scan 07/21/2016-hypermetabolic sigmoid colon mass, mildly hypermetabolic  segment 6 liver lesion ? Laparoscopic assisted rectosigmoid colectomy and segment 8 liver resection 07/27/2016, pathology confirmed a T3, N2b,M1 tumor, 11/16 lymph nodes positive for metastatic carcinoma ? Foundation 1 testing found the tumor to be microsatellite stable, tumor mutation burden low, no BRAF or RAS mutation ? No loss of mismatch repair protein expression  2. Rectal bleeding secondary to #1  3. Multiple polyps on the colonoscopy 07/07/2016  Admission08/24/2017 with  acute GI bleeding secondary to an ulcer at the cecum  4. Family history of breast and pancreas cancer  5.  Delayed nausea following cycle 1 CAPOX-emend added with cycle 2  Disposition:  Patricia Hudson appears well. The plan is to proceed with cycle 2 CAPOX on 09/12/2016. She had delayed nausea following cycle 1. Emend will be added with cycle 2. She will contact us with nausea following this cycle of chemotherapy.  The white count is borderline low. She knows to contact us for a fever. She will return for a nadir CBC following this cycle.  She is feeling depressed regarding Patricia Hudson diagnosis. We'll arrange for Patricia Hudson to meet with the [place] chaplain. She requested an antidepressant. She will begin Lexapro.  Patricia Hudson would like to begin aspirin therapy. We discussed the risk/benefit of aspirin. She understands that aspirin has not been clearly proven beneficial in the adjuvant setting.  She will return for an office visit and cycle 3 CAPOX on 10/03/2016.  She will enroll on the NCI-MPR-1 registry study.   Betsy Coder, MD  09/11/2016  9:13 AM

## 2016-09-11 NOTE — Progress Notes (Signed)
Oncology Nurse Navigator Documentation  Oncology Nurse Navigator Flowsheets 09/11/2016  Navigator Location [facility]  Referral date to RadOnc/MedOnc -  Navigator Encounter Type Follow-up Appt  Telephone -  Abnormal Finding Date -  Confirmed Diagnosis Date -  Surgery Date -  Treatment Initiated Date 08/22/2016  Patient Visit Type MedOnc  Treatment Phase Active Tx--CAPOX  Barriers/Navigation Needs Family concerns  Education Coping with Diagnosis-anxious and became tearful in office today  Interventions Referrals  Referrals Spiritual care--In basket to Johnney Killian (chaplain) to meet with her on 10/24  Coordination of Care -  Education Method -  Support Groups/Services -  Acuity Level 2  Time Spent with Patient 30

## 2016-09-12 ENCOUNTER — Telehealth: Payer: Self-pay | Admitting: *Deleted

## 2016-09-12 ENCOUNTER — Ambulatory Visit (HOSPITAL_BASED_OUTPATIENT_CLINIC_OR_DEPARTMENT_OTHER): Payer: PRIVATE HEALTH INSURANCE

## 2016-09-12 VITALS — BP 114/75 | HR 76 | Temp 98.4°F | Resp 17

## 2016-09-12 DIAGNOSIS — C187 Malignant neoplasm of sigmoid colon: Secondary | ICD-10-CM | POA: Diagnosis not present

## 2016-09-12 DIAGNOSIS — Z5111 Encounter for antineoplastic chemotherapy: Secondary | ICD-10-CM

## 2016-09-12 DIAGNOSIS — C787 Secondary malignant neoplasm of liver and intrahepatic bile duct: Secondary | ICD-10-CM

## 2016-09-12 MED ORDER — PALONOSETRON HCL INJECTION 0.25 MG/5ML
0.2500 mg | Freq: Once | INTRAVENOUS | Status: AC
  Administered 2016-09-12: 0.25 mg via INTRAVENOUS

## 2016-09-12 MED ORDER — OXALIPLATIN CHEMO INJECTION 100 MG/20ML
130.0000 mg/m2 | Freq: Once | INTRAVENOUS | Status: AC
  Administered 2016-09-12: 220 mg via INTRAVENOUS
  Filled 2016-09-12: qty 34

## 2016-09-12 MED ORDER — PALONOSETRON HCL INJECTION 0.25 MG/5ML
INTRAVENOUS | Status: AC
  Filled 2016-09-12: qty 5

## 2016-09-12 MED ORDER — HEPARIN SOD (PORK) LOCK FLUSH 100 UNIT/ML IV SOLN
500.0000 [IU] | Freq: Once | INTRAVENOUS | Status: AC | PRN
  Administered 2016-09-12: 500 [IU]
  Filled 2016-09-12: qty 5

## 2016-09-12 MED ORDER — DEXTROSE 5 % IV SOLN
Freq: Once | INTRAVENOUS | Status: AC
  Administered 2016-09-12: 16:00:00 via INTRAVENOUS

## 2016-09-12 MED ORDER — SODIUM CHLORIDE 0.9% FLUSH
10.0000 mL | INTRAVENOUS | Status: DC | PRN
  Administered 2016-09-12: 10 mL
  Filled 2016-09-12: qty 10

## 2016-09-12 MED ORDER — SODIUM CHLORIDE 0.9 % IV SOLN
Freq: Once | INTRAVENOUS | Status: AC
  Administered 2016-09-12: 16:00:00 via INTRAVENOUS
  Filled 2016-09-12: qty 5

## 2016-09-12 NOTE — Progress Notes (Signed)
Met patient, and patient's husband, today during infusion. Patient seemed upbeat and said that today was a "good day". She shared that she has a great support network, especially her husband, whom she called "a rock". Pt shared that some days are harder than others and that she will keep the support center in mind if she should want to process her emotions/experience at a later time. Counselor shared several of the services offer at the [facility] and asked the patient's husband how he was handling this diagnosis. Pt's husband said he was "good" and said he tries to "keep calm and carry on". Counselor encouraged patient to accept her feelings as they come and validated the idea that emotions ebb and flow throughout treatment.  Pt thanked counselor for the visit and said she would keep the [place] in mind.  Rosana Fret, Counseling intern Direct Supervsior, Lorrin Jackson, Public relations account executive Voicemail - [phone]

## 2016-09-12 NOTE — Telephone Encounter (Signed)
Per LOS I have scheduled appt and notified the scheduler 

## 2016-09-12 NOTE — Patient Instructions (Signed)
[facility] Discharge Instructions for Patients Receiving Chemotherapy  Today you received the following chemotherapy agents:  Oxaliplatin  To help prevent nausea and vomiting after your treatment, we encourage you to take your nausea medication as prescribed.   If you develop nausea and vomiting that is not controlled by your nausea medication, call the clinic.   BELOW ARE SYMPTOMS THAT SHOULD BE REPORTED IMMEDIATELY:  *FEVER GREATER THAN 100.5 F  *CHILLS WITH OR WITHOUT FEVER  NAUSEA AND VOMITING THAT IS NOT CONTROLLED WITH YOUR NAUSEA MEDICATION  *UNUSUAL SHORTNESS OF BREATH  *UNUSUAL BRUISING OR BLEEDING  TENDERNESS IN MOUTH AND THROAT WITH OR WITHOUT PRESENCE OF ULCERS  *URINARY PROBLEMS  *BOWEL PROBLEMS  UNUSUAL RASH Items with * indicate a potential emergency and should be followed up as soon as possible.  Feel free to call the clinic you have any questions or concerns. The clinic phone number is [phone].  Please show the CHEMO ALERT CARD at check-in to the Emergency Department and triage nurse.   

## 2016-09-18 ENCOUNTER — Ambulatory Visit: Payer: PRIVATE HEALTH INSURANCE

## 2016-09-20 ENCOUNTER — Other Ambulatory Visit: Payer: Self-pay | Admitting: *Deleted

## 2016-09-20 DIAGNOSIS — C187 Malignant neoplasm of sigmoid colon: Secondary | ICD-10-CM

## 2016-09-20 MED ORDER — CAPECITABINE 500 MG PO TABS: ORAL_TABLET | ORAL | 0 refills | Status: DC

## 2016-09-20 NOTE — Telephone Encounter (Signed)
Refill request voicemail from [place] for Xeloda. Reviewed with Dr. Benay Spice: Order received, e-prescribed to pharmacy.

## 2016-09-21 ENCOUNTER — Encounter: Payer: Self-pay | Admitting: Genetic Counselor

## 2016-09-21 ENCOUNTER — Telehealth: Payer: Self-pay | Admitting: Genetic Counselor

## 2016-09-21 NOTE — Telephone Encounter (Signed)
Confirmed appt, verified demo and insurance, mailed pt letter

## 2016-09-25 ENCOUNTER — Ambulatory Visit (HOSPITAL_BASED_OUTPATIENT_CLINIC_OR_DEPARTMENT_OTHER): Payer: No Typology Code available for payment source

## 2016-09-25 ENCOUNTER — Other Ambulatory Visit (HOSPITAL_BASED_OUTPATIENT_CLINIC_OR_DEPARTMENT_OTHER): Payer: No Typology Code available for payment source

## 2016-09-25 DIAGNOSIS — Z95828 Presence of other vascular implants and grafts: Secondary | ICD-10-CM | POA: Insufficient documentation

## 2016-09-25 DIAGNOSIS — C187 Malignant neoplasm of sigmoid colon: Secondary | ICD-10-CM | POA: Diagnosis not present

## 2016-09-25 LAB — CBC WITH DIFFERENTIAL/PLATELET
BASO%: 0.5 % (ref 0.0–2.0)
BASOS ABS: 0 10*3/uL (ref 0.0–0.1)
EOS ABS: 0.2 10*3/uL (ref 0.0–0.5)
EOS%: 3.3 % (ref 0.0–7.0)
HEMATOCRIT: 34.3 % — AB (ref 34.8–46.6)
HEMOGLOBIN: 11.5 g/dL — AB (ref 11.6–15.9)
LYMPH#: 0.9 10*3/uL (ref 0.9–3.3)
LYMPH%: 17.7 % (ref 14.0–49.7)
MCH: 30.7 pg (ref 25.1–34.0)
MCHC: 33.6 g/dL (ref 31.5–36.0)
MCV: 91.4 fL (ref 79.5–101.0)
MONO#: 0.3 10*3/uL (ref 0.1–0.9)
MONO%: 6.1 % (ref 0.0–14.0)
NEUT#: 3.5 10*3/uL (ref 1.5–6.5)
NEUT%: 72.4 % (ref 38.4–76.8)
Platelets: 140 10*3/uL — ABNORMAL LOW (ref 145–400)
RBC: 3.76 10*6/uL (ref 3.70–5.45)
RDW: 13.9 % (ref 11.2–14.5)
WBC: 4.8 10*3/uL (ref 3.9–10.3)

## 2016-09-25 MED ORDER — HEPARIN SOD (PORK) LOCK FLUSH 100 UNIT/ML IV SOLN
500.0000 [IU] | Freq: Once | INTRAVENOUS | Status: AC | PRN
  Administered 2016-09-25: 500 [IU] via INTRAVENOUS
  Filled 2016-09-25: qty 5

## 2016-09-25 MED ORDER — SODIUM CHLORIDE 0.9% FLUSH
10.0000 mL | INTRAVENOUS | Status: DC | PRN
  Administered 2016-09-25: 10 mL via INTRAVENOUS
  Filled 2016-09-25: qty 10

## 2016-10-01 ENCOUNTER — Other Ambulatory Visit: Payer: Self-pay | Admitting: Oncology

## 2016-10-03 ENCOUNTER — Telehealth: Payer: Self-pay | Admitting: Oncology

## 2016-10-03 ENCOUNTER — Ambulatory Visit: Payer: No Typology Code available for payment source

## 2016-10-03 ENCOUNTER — Ambulatory Visit (HOSPITAL_BASED_OUTPATIENT_CLINIC_OR_DEPARTMENT_OTHER): Payer: No Typology Code available for payment source

## 2016-10-03 ENCOUNTER — Encounter: Payer: Self-pay | Admitting: *Deleted

## 2016-10-03 ENCOUNTER — Ambulatory Visit (HOSPITAL_BASED_OUTPATIENT_CLINIC_OR_DEPARTMENT_OTHER): Payer: No Typology Code available for payment source | Admitting: Nurse Practitioner

## 2016-10-03 ENCOUNTER — Other Ambulatory Visit (HOSPITAL_BASED_OUTPATIENT_CLINIC_OR_DEPARTMENT_OTHER): Payer: No Typology Code available for payment source

## 2016-10-03 VITALS — BP 111/69 | HR 76 | Temp 98.4°F | Resp 17 | Ht 68.0 in | Wt 127.3 lb

## 2016-10-03 DIAGNOSIS — C187 Malignant neoplasm of sigmoid colon: Secondary | ICD-10-CM

## 2016-10-03 DIAGNOSIS — Z95828 Presence of other vascular implants and grafts: Secondary | ICD-10-CM

## 2016-10-03 DIAGNOSIS — C787 Secondary malignant neoplasm of liver and intrahepatic bile duct: Secondary | ICD-10-CM | POA: Diagnosis not present

## 2016-10-03 DIAGNOSIS — Z5111 Encounter for antineoplastic chemotherapy: Secondary | ICD-10-CM | POA: Diagnosis not present

## 2016-10-03 DIAGNOSIS — D701 Agranulocytosis secondary to cancer chemotherapy: Secondary | ICD-10-CM

## 2016-10-03 LAB — CBC WITH DIFFERENTIAL/PLATELET
BASO%: 0.3 % (ref 0.0–2.0)
BASOS ABS: 0 10*3/uL (ref 0.0–0.1)
EOS%: 10.3 % — ABNORMAL HIGH (ref 0.0–7.0)
Eosinophils Absolute: 0.3 10*3/uL (ref 0.0–0.5)
HEMATOCRIT: 32 % — AB (ref 34.8–46.6)
HEMOGLOBIN: 11 g/dL — AB (ref 11.6–15.9)
LYMPH#: 0.8 10*3/uL — AB (ref 0.9–3.3)
LYMPH%: 25 % (ref 14.0–49.7)
MCH: 31.2 pg (ref 25.1–34.0)
MCHC: 34.4 g/dL (ref 31.5–36.0)
MCV: 90.7 fL (ref 79.5–101.0)
MONO#: 0.6 10*3/uL (ref 0.1–0.9)
MONO%: 20.3 % — ABNORMAL HIGH (ref 0.0–14.0)
NEUT#: 1.3 10*3/uL — ABNORMAL LOW (ref 1.5–6.5)
NEUT%: 44.1 % (ref 38.4–76.8)
PLATELETS: 154 10*3/uL (ref 145–400)
RBC: 3.53 10*6/uL — ABNORMAL LOW (ref 3.70–5.45)
RDW: 14.9 % — AB (ref 11.2–14.5)
WBC: 3 10*3/uL — ABNORMAL LOW (ref 3.9–10.3)

## 2016-10-03 LAB — COMPREHENSIVE METABOLIC PANEL
ALBUMIN: 3.2 g/dL — AB (ref 3.5–5.0)
ALK PHOS: 76 U/L (ref 40–150)
ALT: 43 U/L (ref 0–55)
ANION GAP: 9 meq/L (ref 3–11)
AST: 42 U/L — AB (ref 5–34)
BUN: 9.1 mg/dL (ref 7.0–26.0)
CALCIUM: 8.9 mg/dL (ref 8.4–10.4)
CHLORIDE: 110 meq/L — AB (ref 98–109)
CO2: 24 mEq/L (ref 22–29)
Creatinine: 0.7 mg/dL (ref 0.6–1.1)
EGFR: >90 mL/min/{1.73_m2} (ref >90)
Glucose: 119 mg/dl (ref 70–140)
Potassium: 3.5 mEq/L (ref 3.5–5.1)
Sodium: 143 mEq/L (ref 136–145)
Total Bilirubin: 0.23 mg/dL (ref 0.20–1.20)
Total Protein: 5.6 g/dL — ABNORMAL LOW (ref 6.4–8.3)

## 2016-10-03 MED ORDER — SODIUM CHLORIDE 0.9% FLUSH
10.0000 mL | INTRAVENOUS | Status: DC | PRN
  Administered 2016-10-03: 10 mL
  Filled 2016-10-03: qty 10

## 2016-10-03 MED ORDER — SODIUM CHLORIDE 0.9% FLUSH
10.0000 mL | INTRAVENOUS | Status: DC | PRN
  Administered 2016-10-03: 10 mL via INTRAVENOUS
  Filled 2016-10-03: qty 10

## 2016-10-03 MED ORDER — DEXTROSE 5 % IV SOLN
Freq: Once | INTRAVENOUS | Status: AC
  Administered 2016-10-03: 14:00:00 via INTRAVENOUS

## 2016-10-03 MED ORDER — HEPARIN SOD (PORK) LOCK FLUSH 100 UNIT/ML IV SOLN
500.0000 [IU] | Freq: Once | INTRAVENOUS | Status: AC | PRN
  Administered 2016-10-03: 500 [IU]
  Filled 2016-10-03: qty 5

## 2016-10-03 MED ORDER — PALONOSETRON HCL INJECTION 0.25 MG/5ML
0.2500 mg | Freq: Once | INTRAVENOUS | Status: AC
  Administered 2016-10-03: 0.25 mg via INTRAVENOUS

## 2016-10-03 MED ORDER — SODIUM CHLORIDE 0.9 % IV SOLN
Freq: Once | INTRAVENOUS | Status: AC
  Administered 2016-10-03: 14:00:00 via INTRAVENOUS
  Filled 2016-10-03: qty 5

## 2016-10-03 MED ORDER — LOPERAMIDE HCL 2 MG PO CAPS: 2.0000 mg | ORAL_CAPSULE | Freq: Four times a day (QID) | ORAL | 0 refills | Status: DC | PRN

## 2016-10-03 MED ORDER — OXALIPLATIN CHEMO INJECTION 100 MG/20ML
130.0000 mg/m2 | Freq: Once | INTRAVENOUS | Status: AC
  Administered 2016-10-03: 220 mg via INTRAVENOUS
  Filled 2016-10-03: qty 40

## 2016-10-03 MED ORDER — PALONOSETRON HCL INJECTION 0.25 MG/5ML
INTRAVENOUS | Status: AC
  Filled 2016-10-03: qty 5

## 2016-10-03 NOTE — Patient Instructions (Signed)
[facility] Discharge Instructions for Patients Receiving Chemotherapy  Today you received the following chemotherapy agents Oxaliplatin  To help prevent nausea and vomiting after your treatment, we encourage you to take your nausea medication as directed.   If you develop nausea and vomiting that is not controlled by your nausea medication, call the clinic.   BELOW ARE SYMPTOMS THAT SHOULD BE REPORTED IMMEDIATELY:  *FEVER GREATER THAN 100.5 F  *CHILLS WITH OR WITHOUT FEVER  NAUSEA AND VOMITING THAT IS NOT CONTROLLED WITH YOUR NAUSEA MEDICATION  *UNUSUAL SHORTNESS OF BREATH  *UNUSUAL BRUISING OR BLEEDING  TENDERNESS IN MOUTH AND THROAT WITH OR WITHOUT PRESENCE OF ULCERS  *URINARY PROBLEMS  *BOWEL PROBLEMS  UNUSUAL RASH Items with * indicate a potential emergency and should be followed up as soon as possible.  Feel free to call the clinic you have any questions or concerns. The clinic phone number is [phone].  Please show the CHEMO ALERT CARD at check-in to the Emergency Department and triage nurse.    

## 2016-10-03 NOTE — Telephone Encounter (Signed)
Appointments scheduled per 11/14 LOS. Patient given AVS report and calendars with future scheduled appointments. ° °

## 2016-10-03 NOTE — Progress Notes (Addendum)
[place] OFFICE PROGRESS NOTE   Diagnosis:  Colon cancer  INTERVAL HISTORY:   Patricia Hudson returns as scheduled. She completed cycle 2 CAPOX beginning 09/12/2016. She noted significant improvement in the nausea. She had no vomiting. No mouth sores. No hand or foot pain or redness. About a week and a half ago she began having loose stools with associated gas. She tried Imodium over the weekend. She estimates having 7 or 8 loose stools yesterday; none so far today.  Objective:  Vital signs in last 24 hours:  Blood pressure 111/69, pulse 76, temperature 98.4 F (36.9 C), temperature source Oral, resp. rate 17, height _0  (1.727 m), weight 127 lb 4.8 oz (57.7 kg), last menstrual period 04/20/2013, SpO2 100 %.    HEENT: No thrush or ulcers. Resp: Lungs clear bilaterally. Cardio: Regular rate and rhythm. GI: Abdomen soft and nontender. No hepatomegaly. Vascular: No leg edema. Neuro: Vibratory sense intact over the fingertips per tuning fork exam.  Skin: Palms without erythema. Port-A-Cath without erythema.    Lab Results:  Lab Results  Component Value Date   WBC 3.0 (L) 10/03/2016   HGB 11.0 (L) 10/03/2016   HCT 32.0 (L) 10/03/2016   MCV 90.7 10/03/2016   PLT 154 10/03/2016   NEUTROABS 1.3 (L) 10/03/2016    Imaging:  No results found.  Medications: I have reviewed the patient's current medications.  Assessment/Plan: 1. Adenocarcinoma of the sigmoid colon, status post a colonoscopic biopsy 07/07/2016  Mismatch repair protein expression-normal ? Sigmoid mass noted at 20 cm from the anal verge ? Staging CTs of the chest, abdomen, and pelvis negative for evidence of metastatic disease other than an indeterminate 10 mm right liver lesion ? MRI of the abdomen 07/07/2016 revealed multiple bilateral hepatic cyst. 7 mm inferior subcapsular right liver lesion suspicious for a metastasis ? PET scan 07/21/2016-hypermetabolic sigmoid colon mass, mildly  hypermetabolic segment 6 liver lesion ? Laparoscopic assisted rectosigmoid colectomy and segment 8 liver resection 07/27/2016, pathology confirmed a T3, N2b, M1tumor, 11/16 lymph nodes positive for metastatic carcinoma ? Foundation 1 testing found the tumor to be microsatellite stable, tumor mutation burden low, no BRAF or RAS mutation ? No loss of mismatch repair protein expression ? Cycle 1 CAPOX 08/22/2016 ? Cycle 2 CAPOX 09/12/2016 ? Cycle 3 CAPOX 10/03/2016 (Xeloda dose reduced to 3 tablets every morning and 2 tablets every evening beginning with cycle 3)  2. Rectal bleeding secondary to #1  3. Multiple polyps on the colonoscopy 07/07/2016  Admission 07/13/2016 with acute GI bleeding secondary to an ulcer at the cecum  4. Family history of breast and pancreas cancer  5.  Delayed nausea following cycle 1 CAPOX-emend added with cycle 2  6. Diarrhea following cycle 2 CAPOX   Disposition: Patricia Hudson appears stable. She has completed 2 cycles of CAPOX. Plan to proceed with cycle 3 today as scheduled.  She has mild neutropenia on labs today. She understands to contact the office with fever, chills, other signs of infection. She will return for a nadir CBC 10/11/2016.  She understands the diarrhea is likely related to Xeloda. We reviewed the dosing instructions for Imodium. She will decrease Xeloda to 3 tablets every morning and 2 tablets every evening for 14 days with this cycle. She understands to contact the office with poorly controlled diarrhea.  She will return for a follow-up visit and cycle 4 CAPOX in 3 weeks. She will contact the office in the interim as outlined above or with any  other problems.    Patient seen with Dr. Benay Spice. 25 minutes were spent face-to-face at today's visit with the majority of that time involved in counseling/coordination of care.    Ned Card ANP/GNP-BC   10/03/2016  12:55 PM  This was a shared visit with Ned Card. Patricia Hudson  has a borderline white count to proceed with chemotherapy and mild diarrhea. We discussed delaying chemotherapy for 1 week and she is against this. She understands the potential for infection and worsening diarrhea. We dose reduced the Xeloda. The plan is to proceed with cycle 3 CAPOX today. She knows to contact us for diarrhea or fever. She will return for a nadir CBC next week.  Julieanne Manson, M.D.

## 2016-10-03 NOTE — Progress Notes (Signed)
Ok to treat today with ANC 1.3 per Ned Card, NP.

## 2016-10-03 NOTE — Progress Notes (Signed)
Oncology Nurse Navigator Documentation  Oncology Nurse Navigator Flowsheets 10/03/2016  Navigator Location [facility]  Referral date to RadOnc/MedOnc -  Navigator Encounter Type Treatment  Telephone -  Abnormal Finding Date -  Confirmed Diagnosis Date -  Surgery Date -  Treatment Initiated Date -  Patient Visit Type MedOnc  Treatment Phase Active Tx--CAPOX  Barriers/Navigation Needs No barriers at this time;No Questions;No Needs Reminded her of neutropenic precautions and to take Imodium for diarrhea  Education -  Interventions None required  Referrals -  Coordination of Care -  Education Method -  Support Groups/Services -  Acuity Level 1  Time Spent with Patient 15

## 2016-10-04 ENCOUNTER — Telehealth: Payer: Self-pay | Admitting: *Deleted

## 2016-10-04 NOTE — Telephone Encounter (Signed)
Per LOS I have scheduled appts and notified the scheduler 

## 2016-10-05 ENCOUNTER — Telehealth: Payer: Self-pay

## 2016-10-05 NOTE — Telephone Encounter (Signed)
-----   Message from Owens Shark, NP sent at 10/05/2016 10:55 AM EST ----- Please let her know Dr. Benay Spice would like for her to continue Xeloda at the reduced dose for the remainder of this cycle and will consider resuming at the higher dose when he sees her back on 10/24/2016.

## 2016-10-05 NOTE — Telephone Encounter (Signed)
Called pt to inform her MD would like her to continue the reduced dose of xeloda for the remainder of this cycle and he will consider increasing it back at next visit. Pt verbalized understanding and very appreciative of call and staff.  Pt also requested a meeting with a Education officer, museum, informed her I would send them a message and someone would call her for an appt. States she just feels she needs to talk with someone about whats going on and not be a burden to her family all the time.  Left message on [facility] [place] line

## 2016-10-06 ENCOUNTER — Encounter: Payer: Self-pay | Admitting: *Deleted

## 2016-10-06 NOTE — Progress Notes (Signed)
[name]  Clinical Social Work was referred by nurse for assessment of psychosocial needs due to adjustment to illness concerns.  Clinical Social Worker contacted patient at home to offer support and assess for needs.  Pt requesting counseling session in order to process diagnosis and review coping techniques. CSW provided supportive listening via phone and will meet with pt for counseling session on Wednesday, Nov. 22 after her lab appt. CSW to follow.   Clinical Social Work interventions:  Supportive listening  [name], [place] Worker [place]  [place] Phone: [phone] Fax: [phone]

## 2016-10-11 ENCOUNTER — Encounter: Payer: Self-pay | Admitting: *Deleted

## 2016-10-11 ENCOUNTER — Ambulatory Visit (HOSPITAL_BASED_OUTPATIENT_CLINIC_OR_DEPARTMENT_OTHER): Payer: No Typology Code available for payment source

## 2016-10-11 ENCOUNTER — Other Ambulatory Visit (HOSPITAL_BASED_OUTPATIENT_CLINIC_OR_DEPARTMENT_OTHER): Payer: No Typology Code available for payment source

## 2016-10-11 DIAGNOSIS — C187 Malignant neoplasm of sigmoid colon: Secondary | ICD-10-CM

## 2016-10-11 DIAGNOSIS — Z95828 Presence of other vascular implants and grafts: Secondary | ICD-10-CM

## 2016-10-11 LAB — CBC WITH DIFFERENTIAL/PLATELET
BASO%: 0.7 % (ref 0.0–2.0)
Basophils Absolute: 0 10*3/uL (ref 0.0–0.1)
EOS%: 2.7 % (ref 0.0–7.0)
Eosinophils Absolute: 0.1 10*3/uL (ref 0.0–0.5)
HCT: 36.5 % (ref 34.8–46.6)
HEMOGLOBIN: 12.1 g/dL (ref 11.6–15.9)
LYMPH%: 16.7 % (ref 14.0–49.7)
MCH: 30.3 pg (ref 25.1–34.0)
MCHC: 33.2 g/dL (ref 31.5–36.0)
MCV: 91.4 fL (ref 79.5–101.0)
MONO#: 0.6 10*3/uL (ref 0.1–0.9)
MONO%: 15.7 % — AB (ref 0.0–14.0)
NEUT%: 64.2 % (ref 38.4–76.8)
NEUTROS ABS: 2.4 10*3/uL (ref 1.5–6.5)
Platelets: 105 10*3/uL — ABNORMAL LOW (ref 145–400)
RBC: 3.99 10*6/uL (ref 3.70–5.45)
RDW: 15.5 % — AB (ref 11.2–14.5)
WBC: 3.8 10*3/uL — AB (ref 3.9–10.3)
lymph#: 0.6 10*3/uL — ABNORMAL LOW (ref 0.9–3.3)

## 2016-10-11 MED ORDER — HEPARIN SOD (PORK) LOCK FLUSH 100 UNIT/ML IV SOLN
500.0000 [IU] | Freq: Once | INTRAVENOUS | Status: AC | PRN
  Administered 2016-10-11: 500 [IU] via INTRAVENOUS
  Filled 2016-10-11: qty 5

## 2016-10-11 MED ORDER — SODIUM CHLORIDE 0.9% FLUSH
10.0000 mL | INTRAVENOUS | Status: DC | PRN
  Administered 2016-10-11: 10 mL via INTRAVENOUS
  Filled 2016-10-11: qty 10

## 2016-10-11 NOTE — Progress Notes (Signed)
[place] Work  Clinical Social Work was referred by patient for assessment of psychosocial needs and requested counseling session.  Clinical Social Worker met with patient and her mother at [facility] for supportive counseling session. Pt is experiencing feelings of grief and loss related to her cancer diagnosis, common adjustment to illness concerns. CSW provided supportive counseling and will continue to follow closely for additional sessions/support as needed. Pt appears depressed and went off Lexapro. She may reach out to Dr. Benay Spice to explore other options. CSW educated pt on additional coping techniques and resources. Pt interested in programs through [place] and will reach out to CSW to schedule additional session.    [name], [place] Worker [place]  [place] Phone: [phone] Fax: [phone]

## 2016-10-13 ENCOUNTER — Telehealth: Payer: Self-pay | Admitting: Medical Oncology

## 2016-10-13 NOTE — Telephone Encounter (Signed)
Pt.notified

## 2016-10-13 NOTE — Telephone Encounter (Signed)
-----   Message from Owens Shark, NP sent at 10/13/2016 10:26 AM EST ----- Please let her know the white count is better; platelets are decreased. Call with any bleeding. Follow-up as scheduled.

## 2016-10-19 ENCOUNTER — Other Ambulatory Visit: Payer: Self-pay | Admitting: *Deleted

## 2016-10-19 DIAGNOSIS — C187 Malignant neoplasm of sigmoid colon: Secondary | ICD-10-CM

## 2016-10-19 MED ORDER — CAPECITABINE 500 MG PO TABS
ORAL_TABLET | ORAL | 0 refills | Status: DC
Start: 2016-10-24 — End: 2016-11-14

## 2016-10-24 ENCOUNTER — Other Ambulatory Visit (HOSPITAL_BASED_OUTPATIENT_CLINIC_OR_DEPARTMENT_OTHER): Payer: No Typology Code available for payment source

## 2016-10-24 ENCOUNTER — Ambulatory Visit: Payer: No Typology Code available for payment source

## 2016-10-24 ENCOUNTER — Telehealth: Payer: Self-pay | Admitting: Oncology

## 2016-10-24 ENCOUNTER — Ambulatory Visit (HOSPITAL_BASED_OUTPATIENT_CLINIC_OR_DEPARTMENT_OTHER): Payer: No Typology Code available for payment source | Admitting: Oncology

## 2016-10-24 VITALS — BP 114/65 | HR 92 | Temp 98.2°F | Resp 18 | Ht 68.0 in | Wt 127.4 lb

## 2016-10-24 DIAGNOSIS — D701 Agranulocytosis secondary to cancer chemotherapy: Secondary | ICD-10-CM | POA: Diagnosis not present

## 2016-10-24 DIAGNOSIS — C187 Malignant neoplasm of sigmoid colon: Secondary | ICD-10-CM

## 2016-10-24 DIAGNOSIS — Z95828 Presence of other vascular implants and grafts: Secondary | ICD-10-CM

## 2016-10-24 DIAGNOSIS — D6959 Other secondary thrombocytopenia: Secondary | ICD-10-CM | POA: Diagnosis not present

## 2016-10-24 LAB — COMPREHENSIVE METABOLIC PANEL
ALT: 42 U/L (ref 0–55)
AST: 43 U/L — AB (ref 5–34)
Albumin: 3.1 g/dL — ABNORMAL LOW (ref 3.5–5.0)
Alkaline Phosphatase: 105 U/L (ref 40–150)
Anion Gap: 7 mEq/L (ref 3–11)
BUN: 11.1 mg/dL (ref 7.0–26.0)
CHLORIDE: 111 meq/L — AB (ref 98–109)
CO2: 24 meq/L (ref 22–29)
CREATININE: 0.6 mg/dL (ref 0.6–1.1)
Calcium: 8.9 mg/dL (ref 8.4–10.4)
EGFR: >90 mL/min/{1.73_m2} (ref >90)
GLUCOSE: 100 mg/dL (ref 70–140)
Potassium: 4.2 mEq/L (ref 3.5–5.1)
SODIUM: 143 meq/L (ref 136–145)
Total Bilirubin: 0.25 mg/dL (ref 0.20–1.20)
Total Protein: 5.9 g/dL — ABNORMAL LOW (ref 6.4–8.3)

## 2016-10-24 LAB — CBC WITH DIFFERENTIAL/PLATELET
BASO%: 0.8 % (ref 0.0–2.0)
Basophils Absolute: 0 10*3/uL (ref 0.0–0.1)
EOS%: 12.6 % — AB (ref 0.0–7.0)
Eosinophils Absolute: 0.3 10*3/uL (ref 0.0–0.5)
HCT: 31.5 % — ABNORMAL LOW (ref 34.8–46.6)
HGB: 10.5 g/dL — ABNORMAL LOW (ref 11.6–15.9)
LYMPH#: 0.9 10*3/uL (ref 0.9–3.3)
LYMPH%: 36.4 % (ref 14.0–49.7)
MCH: 30.6 pg (ref 25.1–34.0)
MCHC: 33.4 g/dL (ref 31.5–36.0)
MCV: 91.5 fL (ref 79.5–101.0)
MONO#: 0.5 10*3/uL (ref 0.1–0.9)
MONO%: 19.2 % — ABNORMAL HIGH (ref 0.0–14.0)
NEUT#: 0.8 10*3/uL — ABNORMAL LOW (ref 1.5–6.5)
NEUT%: 31 % — AB (ref 38.4–76.8)
Platelets: 97 10*3/uL — ABNORMAL LOW (ref 145–400)
RBC: 3.44 10*6/uL — AB (ref 3.70–5.45)
RDW: 17.3 % — ABNORMAL HIGH (ref 11.2–14.5)
WBC: 2.5 10*3/uL — ABNORMAL LOW (ref 3.9–10.3)

## 2016-10-24 MED ORDER — HEPARIN SOD (PORK) LOCK FLUSH 100 UNIT/ML IV SOLN
500.0000 [IU] | Freq: Once | INTRAVENOUS | Status: AC
  Administered 2016-10-24: 500 [IU] via INTRAVENOUS
  Filled 2016-10-24: qty 5

## 2016-10-24 MED ORDER — SODIUM CHLORIDE 0.9% FLUSH
10.0000 mL | INTRAVENOUS | Status: DC | PRN
  Administered 2016-10-24: 10 mL via INTRAVENOUS
  Filled 2016-10-24: qty 10

## 2016-10-24 NOTE — Telephone Encounter (Signed)
Gave patient avs report and appointments for December and January  °

## 2016-10-24 NOTE — Patient Instructions (Signed)

## 2016-10-24 NOTE — Progress Notes (Signed)
  [place] OFFICE PROGRESS NOTE   Diagnosis: Colon cancer  INTERVAL HISTORY:   Patricia Hudson returns as scheduled. She completed another cycle of CAPOX beginning 10/03/2016. The Xeloda was dose reduced secondary to diarrhea with the previous cycle of chemotherapy. She reports no diarrhea with this cycle. She had no nausea or vomiting, but her appetite has been poor. No hand or foot pain. She is working. No other complaint. She took the morning dose of Xeloda today.  Objective:  Vital signs in last 24 hours:  Blood pressure 114/65, pulse 92, temperature 98.2 F (36.8 C), temperature source Oral, resp. rate 18, height 5' 8" (1.727 m), weight 127 lb 6.4 oz (57.8 kg), last menstrual period 04/20/2013, SpO2 100 %.    HEENT: No thrush or ulcers Resp: Lungs clear bilaterally Cardio: Regular rate and rhythm GI: No hepatomegaly, nontender Vascular: No leg edema  Skin: Palms without erythema. Dryness at the soles with superficial flaking of the skin.   Portacath/PICC-without erythema  Lab Results:  Lab Results  Component Value Date   WBC 2.5 (L) 10/24/2016   HGB 10.5 (L) 10/24/2016   HCT 31.5 (L) 10/24/2016   MCV 91.5 10/24/2016   PLT 97 (L) 10/24/2016   NEUTROABS 0.8 (L) 10/24/2016     Medications: I have reviewed the patient's current medications.  Assessment/Plan: 1. Adenocarcinoma of the sigmoid colon, status post a colonoscopic biopsy 07/07/2016  Mismatch repair protein expression-normal ? Sigmoid mass noted at 20 cm from the anal verge ? Staging CTs of the chest, abdomen, and pelvis negative for evidence of metastatic disease other than an indeterminate 10 mm right liver lesion ? MRI of the abdomen 07/07/2016 revealed multiple bilateral hepatic cyst. 7 mm inferior subcapsular right liver lesion suspicious for a metastasis ? PET scan 07/21/2016-hypermetabolic sigmoid colon mass, mildly hypermetabolic segment 6 liver lesion ? Laparoscopic assisted  rectosigmoid colectomy and segment 8 liver resection 07/27/2016, pathology confirmed a T3, N2b, M1tumor, 11/16 lymph nodes positive for metastatic carcinoma ? Foundation 1 testing found the tumor to be microsatellite stable, tumor mutation burden low, no BRAFor RAS mutation ? No loss of mismatch repair protein expression ? Cycle 1 CAPOX 08/22/2016 ? Cycle 2 CAPOX 09/12/2016 ? Cycle 3 CAPOX 10/03/2016 (Xeloda dose reduced to 3 tablets every morning and 2 tablets every evening beginning with cycle 3)  2. Rectal bleeding secondary to #1  3. Multiple polyps on the colonoscopy 07/07/2016  Admission 07/13/2016 with acute GI bleeding secondary to an ulcer at the cecum  4. Family history of breast and pancreas cancer  5. Delayed nausea following cycle 1 CAPOX-emend added with cycle 2  6. Diarrhea following cycle 2 CAPOX  7.  Neutropenia/thrombocytopenia secondary to chemotherapy    Disposition:  Patricia Hudson has completed 2 cycles of adjuvant CAPOX. She is tolerating the chemotherapy well. She has developed neutropenia/thrombocytopenia. Chemotherapy will be held today. She will take no further Xeloda this week. She will return for cycle 3 CAPOX on 10/31/2016. Oxaliplatin will be dose reduced with cycle 3. We will make a decision on re-escalating the Xeloda dose based on her CBC 10/31/2016.    Betsy Coder, MD  10/24/2016  8:36 AM

## 2016-10-25 ENCOUNTER — Other Ambulatory Visit: Payer: Self-pay | Admitting: Oncology

## 2016-10-29 ENCOUNTER — Other Ambulatory Visit: Payer: Self-pay | Admitting: Oncology

## 2016-10-31 ENCOUNTER — Other Ambulatory Visit (HOSPITAL_BASED_OUTPATIENT_CLINIC_OR_DEPARTMENT_OTHER): Payer: No Typology Code available for payment source

## 2016-10-31 ENCOUNTER — Ambulatory Visit: Payer: No Typology Code available for payment source

## 2016-10-31 ENCOUNTER — Ambulatory Visit (HOSPITAL_BASED_OUTPATIENT_CLINIC_OR_DEPARTMENT_OTHER): Payer: No Typology Code available for payment source

## 2016-10-31 ENCOUNTER — Other Ambulatory Visit: Payer: PRIVATE HEALTH INSURANCE

## 2016-10-31 ENCOUNTER — Ambulatory Visit (HOSPITAL_BASED_OUTPATIENT_CLINIC_OR_DEPARTMENT_OTHER): Payer: No Typology Code available for payment source | Admitting: Genetic Counselor

## 2016-10-31 VITALS — BP 125/85 | HR 76 | Temp 98.7°F | Resp 20

## 2016-10-31 DIAGNOSIS — Z809 Family history of malignant neoplasm, unspecified: Secondary | ICD-10-CM

## 2016-10-31 DIAGNOSIS — Z800 Family history of malignant neoplasm of digestive organs: Secondary | ICD-10-CM

## 2016-10-31 DIAGNOSIS — C187 Malignant neoplasm of sigmoid colon: Secondary | ICD-10-CM

## 2016-10-31 DIAGNOSIS — Z801 Family history of malignant neoplasm of trachea, bronchus and lung: Secondary | ICD-10-CM

## 2016-10-31 DIAGNOSIS — Z5111 Encounter for antineoplastic chemotherapy: Secondary | ICD-10-CM

## 2016-10-31 DIAGNOSIS — D701 Agranulocytosis secondary to cancer chemotherapy: Secondary | ICD-10-CM | POA: Diagnosis not present

## 2016-10-31 DIAGNOSIS — C787 Secondary malignant neoplasm of liver and intrahepatic bile duct: Secondary | ICD-10-CM | POA: Diagnosis not present

## 2016-10-31 DIAGNOSIS — Z95828 Presence of other vascular implants and grafts: Secondary | ICD-10-CM

## 2016-10-31 DIAGNOSIS — Z803 Family history of malignant neoplasm of breast: Secondary | ICD-10-CM

## 2016-10-31 DIAGNOSIS — Z806 Family history of leukemia: Secondary | ICD-10-CM

## 2016-10-31 LAB — CBC WITH DIFFERENTIAL/PLATELET
BASO%: 1.2 % (ref 0.0–2.0)
Basophils Absolute: 0.1 10*3/uL (ref 0.0–0.1)
EOS%: 8.8 % — AB (ref 0.0–7.0)
Eosinophils Absolute: 0.4 10*3/uL (ref 0.0–0.5)
HCT: 35.1 % (ref 34.8–46.6)
HGB: 11.4 g/dL — ABNORMAL LOW (ref 11.6–15.9)
LYMPH%: 21.5 % (ref 14.0–49.7)
MCH: 30.3 pg (ref 25.1–34.0)
MCHC: 32.5 g/dL (ref 31.5–36.0)
MCV: 93.4 fL (ref 79.5–101.0)
MONO#: 0.5 10*3/uL (ref 0.1–0.9)
MONO%: 11.6 % (ref 0.0–14.0)
NEUT%: 56.9 % (ref 38.4–76.8)
NEUTROS ABS: 2.4 10*3/uL (ref 1.5–6.5)
PLATELETS: 111 10*3/uL — AB (ref 145–400)
RBC: 3.76 10*6/uL (ref 3.70–5.45)
RDW: 20 % — ABNORMAL HIGH (ref 11.2–14.5)
WBC: 4.3 10*3/uL (ref 3.9–10.3)
lymph#: 0.9 10*3/uL (ref 0.9–3.3)

## 2016-10-31 MED ORDER — PALONOSETRON HCL INJECTION 0.25 MG/5ML
0.2500 mg | Freq: Once | INTRAVENOUS | Status: AC
  Administered 2016-10-31: 0.25 mg via INTRAVENOUS

## 2016-10-31 MED ORDER — SODIUM CHLORIDE 0.9 % IV SOLN
Freq: Once | INTRAVENOUS | Status: AC
  Administered 2016-10-31: 14:00:00 via INTRAVENOUS
  Filled 2016-10-31: qty 5

## 2016-10-31 MED ORDER — OXALIPLATIN CHEMO INJECTION 100 MG/20ML
100.0000 mg/m2 | Freq: Once | INTRAVENOUS | Status: AC
  Administered 2016-10-31: 170 mg via INTRAVENOUS
  Filled 2016-10-31: qty 34

## 2016-10-31 MED ORDER — HEPARIN SOD (PORK) LOCK FLUSH 100 UNIT/ML IV SOLN
500.0000 [IU] | Freq: Once | INTRAVENOUS | Status: AC | PRN
  Administered 2016-10-31: 500 [IU]
  Filled 2016-10-31: qty 5

## 2016-10-31 MED ORDER — SODIUM CHLORIDE 0.9% FLUSH
10.0000 mL | INTRAVENOUS | Status: DC | PRN
  Administered 2016-10-31: 10 mL via INTRAVENOUS
  Filled 2016-10-31: qty 10

## 2016-10-31 MED ORDER — SODIUM CHLORIDE 0.9% FLUSH
10.0000 mL | INTRAVENOUS | Status: DC | PRN
  Administered 2016-10-31: 10 mL
  Filled 2016-10-31: qty 10

## 2016-10-31 MED ORDER — DEXTROSE 5 % IV SOLN
Freq: Once | INTRAVENOUS | Status: AC
  Administered 2016-10-31: 14:00:00 via INTRAVENOUS

## 2016-10-31 MED ORDER — PALONOSETRON HCL INJECTION 0.25 MG/5ML
INTRAVENOUS | Status: AC
  Filled 2016-10-31: qty 5

## 2016-10-31 NOTE — Patient Instructions (Signed)
[facility] Discharge Instructions for Patients Receiving Chemotherapy  Today you received the following chemotherapy agents:  Oxaliplatin  To help prevent nausea and vomiting after your treatment, we encourage you to take your nausea medication as ordered per MD.   If you develop nausea and vomiting that is not controlled by your nausea medication, call the clinic.   BELOW ARE SYMPTOMS THAT SHOULD BE REPORTED IMMEDIATELY:  *FEVER GREATER THAN 100.5 F  *CHILLS WITH OR WITHOUT FEVER  NAUSEA AND VOMITING THAT IS NOT CONTROLLED WITH YOUR NAUSEA MEDICATION  *UNUSUAL SHORTNESS OF BREATH  *UNUSUAL BRUISING OR BLEEDING  TENDERNESS IN MOUTH AND THROAT WITH OR WITHOUT PRESENCE OF ULCERS  *URINARY PROBLEMS  *BOWEL PROBLEMS  UNUSUAL RASH Items with * indicate a potential emergency and should be followed up as soon as possible.  Feel free to call the clinic you have any questions or concerns. The clinic phone number is [phone].  Please show the CHEMO ALERT CARD at check-in to the Emergency Department and triage nurse.   

## 2016-10-31 NOTE — Progress Notes (Signed)
Per Dr. Benay Spice: Pt to resume original dose of Xeloda today. (4 tablets QAM, 3 tablets QPM.) Treat with chemistry panel from 10/24/16. Infusion RN made aware.

## 2016-11-01 ENCOUNTER — Encounter: Payer: Self-pay | Admitting: Genetic Counselor

## 2016-11-01 DIAGNOSIS — Z803 Family history of malignant neoplasm of breast: Secondary | ICD-10-CM | POA: Insufficient documentation

## 2016-11-01 NOTE — Progress Notes (Signed)
REFERRING PROVIDER: Ladell Pier., MD  PRIMARY PROVIDER:  Kem Boroughs, FNP  PRIMARY REASON FOR VISIT:  1. Cancer of sigmoid colon ([place])   2. Family history of breast cancer in female   3. Family history of pancreatic cancer   4. Family history of lung cancer   5. Family history of leukemia   6. Family history of cancer      HISTORY OF PRESENT ILLNESS:   [name], a 50 y.o. female, was seen for a [place] cancer genetics consultation at the request of Dr. Jana Hakim due to a personal history of colon cancer at 47 and family history of breast, pancreatic, and other cancers.  Ms. Massar presents to clinic today with her mother to discuss the possibility of a hereditary predisposition to cancer, genetic testing, and to further clarify her future cancer risks, as well as potential cancer risks for family members.   In August 2017, at the age of 47, Ms. Glade was diagnosed with invasive adenocarcinoma of the sigmoid colon.  Tumor testing demonstrated microsatellite stability and normal immunohistochemistry. This was treated with surgery and chemotherapy.  Ms. Retz reports no additional personal history of cancer.    CANCER HISTORY:  Oncology History   Presented to GI with intermittent rectal bleeding     Cancer of sigmoid colon ([place])   07/07/2016 Initial Diagnosis    Cancer of sigmoid colon ([place])     07/07/2016 Procedure    COLONOSCOPY:per Dr. Tawny Hopping large mass in sigmoid colon at 20 cm from anal verge; #3 polyps additionally      07/07/2016 Pathology Results    Moderately differentiated adenocarcinoma with polyps (adenomas)      07/07/2016 Imaging    CT ABD/PELVIS: Colonic wall thickening at rectosigmoid junction;mildly enlarged lymph nodes in mesocolon up to 13m; liver lesion      07/07/2016 Imaging    MRI ABD: 1. Isolated 7 mm inferior right hepatic lobe lesion is suspicious for metastasis. Other liver lesions benign. 2. No evidence of extrahepatic  metastatic disease in the abdomen.      07/10/2016 Imaging    CT CHEST: Negative for mets      08/30/2016 Miscellaneous    Foundation One results received        HORMONAL RISK FACTORS:  Menarche was at age 50  First live birth at age 50  OCP use for approximately 15 years.  Ovaries intact: yes.  Hysterectomy: no.  Menopausal status: postmenopausal - LMP at 48. HRT use: 0 years. Colonoscopy: this was her first colonoscopy; 3 additional colon polyps found. Mammogram within the last year: last mammogram was in 08/2015. Number of breast biopsies: 0. Up to date with pelvic exams:  yes. Any excessive radiation exposure/other exposures in the past:  Worked in [employer] for 1-2 years, but took precautions when working around dangerous chemicals/isotopes.  Reports some secondhand smoke exposure as a kid (her father smoked)  Past Medical History:  Diagnosis Date  . Cancer ([facility] colon cancer   liver mass  . Headache    migraine  . Renal calculi 1994    Past Surgical History:  Procedure Laterality Date  . COLONOSCOPY    . COLONOSCOPY N/A 07/14/2016   Procedure: COLONOSCOPY;  Surgeon: PCarol Ada MD;  [facility];  Service: Endoscopy;  Laterality: N/A;  . LAPAROSCOPIC LIVER CYST REMOVAL N/A 07/27/2016   Procedure: REMOVAL OF LIVER MASS;  Surgeon: TJackolyn Confer MD;  Location: [facility];  Service: General;  Laterality: N/A;  .  LAPAROSCOPIC PARTIAL COLECTOMY N/A 07/27/2016   Procedure: LAPAROSCOPIC PARTIAL COLECTOMY MOBILIZATION OF SPLENIC FLEXURE LAPAROSCOPIC SEGMENTAL LIVER RESECTION OF SEGMENT 8 OF LIVER;  Surgeon: Jackolyn Confer, MD;  Location: [facility];  Service: General;  Laterality: N/A;  . PORTACATH PLACEMENT Right 08/17/2016   Procedure: ULTRASOUND GUIDED INSERTION PORT-A-CATH RIGHT INTERNAL JUGULAR;  Surgeon: Jackolyn Confer, MD;  Location: [facility];  Service: General;  Laterality: Right;  . TONSILLECTOMY AND ADENOIDECTOMY  1970's    Social History   Social  History  . Marital status: Married    Spouse name: N/A  . Number of children: 1  . Years of education: N/A   Occupational History  . attorney    Social History Main Topics  . Smoking status: Never Smoker  . Smokeless tobacco: Never Used  . Alcohol use Yes     Comment: 2-3 drinks per year (10/2016)  . Drug use: No  . Sexual activity: Yes    Partners: Male    Birth control/ protection: IUD   Other Topics Concern  . 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  Husband is a family MD   One child     FAMILY HISTORY:  We obtained a detailed, 4-generation family history.  Significant diagnoses are listed below: Family History  Problem Relation Age of Onset  . Diabetes Father   . Hypertension Father   . Lung cancer Father 26    smoker  . Colon polyps Father     unspecified number  . Breast cancer Mother 51    triple negative.BRCA Neg  . Colon polyps Mother     unspecified number  . Colon polyps Brother     unspecified number  . Breast cancer Maternal Grandmother     dx late 9s; s/p mastectomy; d. 66y  . Breast cancer Paternal Aunt 70    carcinoma in situ  . Pancreatic cancer Maternal Uncle 51    d. 65y  . Leukemia Other     maternal great uncle (MGM's brother)  . Other Maternal Grandfather     hx of "half of stomach removed" - not aware of cancer  . Parkinson's disease Paternal Grandfather   . Heart attack Paternal Grandfather     d. 97y  . Cancer Other     paternal great aunt (PGM's sister) d. 32s, abdominal/Gyn cancer; lim info    Ms. Tavano has one son who is 16 years old.  She has two full brothers, ages 17 and 72.  Her oldest brother has had colon polyps (unspecified number).  Her other brother has not yet had a colonoscopy.  She reports no history of cancer for her brothers or for her nieces and nephews.   Ms. Babich mother is currently 45.  She was diagnosed with breast cancer at 1, and she  had negative BRCA testing in 2011-2012, per her report.  She has a history of colon polyps (unspecified number).  Her mother has one full brother who was diagnosed with pancreatic cancer at 8 and passed away at 34.  Her brother has a daughter who has never had cancer.  Ms. Hopf maternal grandmother was diagnosed with breast cancer in her late 64s and underwent mastectomy.  She passed away at 42 from complications in the setting of rheumatoid arthritis medications.  Her grandmother has a brother who had a history of leukemia.  Ms. Dias maternal grandfather passed away at 78.  He  had a history of half of his stomach removed, but not from any known cancer.  Ms. Blass is unaware of any additional maternal family history of cancer.  Ms. Colquhoun father is also 19.  He was a smoker and was diagnosed with lung cancer at 82.  He has a history of colon polyps (of unspecified number).  He has one full sister who is 16.  She has a history of breast cancer (in situ) that was diagnosed at 14.  Ms. Cleland paternal grandmother passed away at 23 after she quit taking her medications.  She has a sister who passed away from an abdominal or gynecological cancer in her 79s.  Ms. Jacqlyn Larsen paternal grandfather passed away from parkinson's and a heart attack at 31.  Ms. Bober is unaware of any additional paternal family history of cancer.  Patient's maternal and paternal ancestors are of English descent. There is no reported Ashkenazi Jewish ancestry. There is no known consanguinity.  GENETIC COUNSELING ASSESSMENT: MOLLI GETHERS is a 50 y.o. female with a personal and family history of cancer which is somewhat suggestive of a hereditary cancer syndrome and predisposition to cancer. We, therefore, discussed and recommended the following at today's visit.   DISCUSSION: We reviewed the characteristics, features and inheritance patterns of hereditary cancer syndromes, particularly those caused by mutations within the  Lynch syndrome genes, APC, MUTYH, and BRCA1/2. We also discussed genetic testing, including the appropriate family members to test, the process of testing, insurance coverage and turn-around-time for results. We discussed the implications of a negative, positive and/or variant of uncertain significant result. We recommended Ms. Olen Pel pursue genetic testing for the 42-gene [facility] Common Hereditary Cancer Panel (with Breast, Gyn, GI) through [facility].  The 42-gene [facility] Common Hereditary Cancers Panel (Breast, Gyn, GI) performed by [facility] [place]) includes sequencing and/or deletion/duplication analysis for the following genes: APC, ATM, AXIN2, BARD1, BMPR1A, BRCA1, BRCA2, BRIP1, CDH1, CDKN2A, CHEK2, DICER1, EPCAM, GREM1, KIT, MEN1, MLH1, MSH2, MSH6, MUTYH, NBN, NF1, PALB2, PDGFRA, PMS2, POLD1, POLE, PTEN, RAD50, RAD51C, RAD51D, SDHA, SDHB, SDHC, SDHD, SMAD4, SMARCA4, STK11, TP53, TSC1, TSC2, and VHL.   Based on Ms. Perona's personal and family history of cancer, she meets medical criteria for genetic testing. Despite that she meets criteria, she may still have an out of pocket cost. We discussed that if her out of pocket cost for testing is over $100, the laboratory will call and confirm whether she wants to proceed with testing.  If the out of pocket cost of testing is less than $100 she will be billed by the genetic testing laboratory.   Based on the patient's personal and family history, statistical models Baker Janus model and IBIS/Tyrer-Cuzick model) and literature data were used to estimate her risk of developing breast cancer. These estimate her lifetime risk of developing breast cancer to be approximately 16.3% to 25.5%. This estimation does not take into account any genetic testing results.  The patient's lifetime breast cancer risk is a preliminary estimate based on available information using one of several models endorsed by the [place] (ACS). The ACS  recommends consideration of breast MRI screening as an adjunct to mammography for patients at high risk (defined as 20% or greater lifetime risk). A more detailed breast cancer risk assessment can be considered, if clinically indicated.   PLAN: After considering the risks, benefits, and limitations, Ms. Olen Pel  provided informed consent to pursue genetic testing and the blood sample was sent to [facility] for  analysis of the 42-gene [facility] Common Hereditary Cancers Panel (Breast, Gyn, GI). Results should be available within approximately 2-3 weeks' time, at which point they will be disclosed by telephone to Ms. Wiseman, as will any additional recommendations warranted by these results. Ms. Fellows will receive a summary of her genetic counseling visit and a copy of her results once available. This information will also be available in Epic. We encouraged Ms. Muecke to remain in contact with cancer genetics annually so that we can continuously update the family history and inform her of any changes in cancer genetics and testing that may be of benefit for her family. Ms. Bartles questions were answered to her satisfaction today. Our contact information was provided should additional questions or concerns arise.  Thank you for the referral and allowing [place] to share in the care of your patient.   Jeanine Luz, MS, [facility] Certified Genetic Counselor [email or web address] Phone: [phone]  The patient was seen for a total of 60 minutes in face-to-face genetic counseling.  This patient was discussed with Drs. Magrinat, Lindi Adie and/or Burr Medico who agrees with the above.    _______________________________________________________________________ For Office Staff:  Number of people involved in session: 2 Was an Intern/ student involved with case: no

## 2016-11-10 ENCOUNTER — Telehealth: Payer: Self-pay | Admitting: Genetic Counselor

## 2016-11-14 ENCOUNTER — Other Ambulatory Visit: Payer: Self-pay | Admitting: *Deleted

## 2016-11-14 ENCOUNTER — Other Ambulatory Visit: Payer: No Typology Code available for payment source

## 2016-11-14 ENCOUNTER — Ambulatory Visit: Payer: No Typology Code available for payment source | Admitting: Oncology

## 2016-11-14 ENCOUNTER — Ambulatory Visit: Payer: No Typology Code available for payment source

## 2016-11-14 DIAGNOSIS — C187 Malignant neoplasm of sigmoid colon: Secondary | ICD-10-CM

## 2016-11-14 MED ORDER — CAPECITABINE 500 MG PO TABS: ORAL_TABLET | ORAL | 0 refills | Status: DC

## 2016-11-15 ENCOUNTER — Encounter: Payer: Self-pay | Admitting: Genetic Counselor

## 2016-11-15 DIAGNOSIS — Z1379 Encounter for other screening for genetic and chromosomal anomalies: Secondary | ICD-10-CM | POA: Insufficient documentation

## 2016-11-15 NOTE — Telephone Encounter (Signed)
Revealed negative genetic testing on the common hereditary cancer panel.  I released genetic testing results out to her via [facility] portal.

## 2016-11-17 ENCOUNTER — Ambulatory Visit: Payer: Self-pay | Admitting: Genetic Counselor

## 2016-11-17 DIAGNOSIS — Z803 Family history of malignant neoplasm of breast: Secondary | ICD-10-CM

## 2016-11-17 DIAGNOSIS — Z1379 Encounter for other screening for genetic and chromosomal anomalies: Secondary | ICD-10-CM

## 2016-11-17 DIAGNOSIS — C187 Malignant neoplasm of sigmoid colon: Secondary | ICD-10-CM

## 2016-11-17 NOTE — Progress Notes (Signed)
HPI: Patricia Hudson was previously seen in the [place] clinic due to a personal and family history of cancer and concerns regarding a hereditary predisposition to cancer. Please refer to our prior cancer genetics clinic note for more information regarding Patricia Hudson's medical, social and family histories, and our assessment and recommendations, at the time. Patricia Hudson recent genetic test results were disclosed to her, as were recommendations warranted by these results. These results and recommendations are discussed in more detail below.  CANCER HISTORY:  Oncology History   Presented to GI with intermittent rectal bleeding     Cancer of sigmoid colon ([place])   07/07/2016 Initial Diagnosis    Cancer of sigmoid colon ([place])     07/07/2016 Procedure    COLONOSCOPY:per Dr. Tawny Hopping large mass in sigmoid colon at 20 cm from anal verge; #3 polyps additionally      07/07/2016 Pathology Results    Moderately differentiated adenocarcinoma with polyps (adenomas)      07/07/2016 Imaging    CT ABD/PELVIS: Colonic wall thickening at rectosigmoid junction;mildly enlarged lymph nodes in mesocolon up to 31m; liver lesion      07/07/2016 Imaging    MRI ABD: 1. Isolated 7 mm inferior right hepatic lobe lesion is suspicious for metastasis. Other liver lesions benign. 2. No evidence of extrahepatic metastatic disease in the abdomen.      07/10/2016 Imaging    CT CHEST: Negative for mets      08/30/2016 Miscellaneous    Foundation One results received      11/08/2016 Genetic Testing    Negative genetic testing on the Common Hereditary cancer panel.  The Hereditary Gene Panel offered by [facility] includes sequencing and/or deletion duplication testing of the following 43 genes: APC, ATM, AXIN2, BARD1, BLM, BMPR1A, BRCA1, BRCA2, BRIP1, BUB1B, CDH1, CDKN2A (p14ARF), CDKN2A (p16INK4a), CHEK2, DICER1, ENG, EPCAM (Deletion/duplication testing only), FLCN, GREM1 (promoter region  deletion/duplication testing only), KIT, MEN1, MLH1, MSH2, MSH6, MUTYH, NBN, NF1, PALB2, PDGFRA, PMS2, POLD1, POLE, PTEN, RAD50, RAD51C, RAD51D, SDHB, SDHC, SDHD, SMAD4, SMARCA4. STK11, TP53, TSC1, TSC2, and VHL.  The following gene was evaluated for sequence changes only: SDHA and HOXB13 c.251G>A variant only. The report date is November 08, 2016.       FAMILY HISTORY:  We obtained a detailed, 4-generation family history.  Significant diagnoses are listed below: Family History  Problem Relation Age of Onset  . Diabetes Father   . Hypertension Father   . Lung cancer Father 772   smoker  . Colon polyps Father     unspecified number  . Breast cancer Mother 764   triple negative.BRCA Neg  . Colon polyps Mother     unspecified number  . Colon polyps Brother     unspecified number  . Breast cancer Maternal Grandmother     dx late 583s s/p mastectomy; d. 66y  . Breast cancer Paternal Aunt 641   carcinoma in situ  . Pancreatic cancer Maternal Uncle 667   d. 65y  . Leukemia Other     maternal great uncle (MGM's brother)  . Other Maternal Grandfather     hx of "half of stomach removed" - not aware of cancer  . Parkinson's disease Paternal Grandfather   . Heart attack Paternal Grandfather     d. 783y . Cancer Other     paternal great aunt (PGM's sister) d. 690s abdominal/Gyn cancer; lim info    Ms. MBerlandhas one son who is 137  years old.  She has two full brothers, ages 49 and 64.  Her oldest brother has had colon polyps (unspecified number).  Her other brother has not yet had a colonoscopy.  She reports no history of cancer for her brothers or for her nieces and nephews.   Patricia Hudson mother is currently 55.  She was diagnosed with breast cancer at 46, and she had negative BRCA testing in 2011-2012, per her report.  She has a history of colon polyps (unspecified number).  Her mother has one full brother who was diagnosed with pancreatic cancer at 58 and passed away at 1.  Her  brother has a daughter who has never had cancer.  Patricia Hudson maternal grandmother was diagnosed with breast cancer in her late 54s and underwent mastectomy.  She passed away at 12 from complications in the setting of rheumatoid arthritis medications.  Her grandmother has a brother who had a history of leukemia.  Patricia Hudson maternal grandfather passed away at 64.  He had a history of half of his stomach removed, but not from any known cancer.  Patricia Hudson is unaware of any additional maternal family history of cancer.  Patricia Hudson father is also 70.  He was a smoker and was diagnosed with lung cancer at 37.  He has a history of colon polyps (of unspecified number).  He has one full sister who is 46.  She has a history of breast cancer (in situ) that was diagnosed at 73.  Patricia Hudson paternal grandmother passed away at 63 after she quit taking her medications.  She has a sister who passed away from an abdominal or gynecological cancer in her 41s.  Patricia Hudson paternal grandfather passed away from parkinson's and a heart attack at 48.  Patricia Hudson is unaware of any additional paternal family history of cancer.  Patient's maternal and paternal ancestors are of English descent. There is no reported Ashkenazi Jewish ancestry. There is no known consanguinity.  GENETIC TEST RESULTS: Genetic testing reported out on November 08, 2016 through the Common Hereditary cancer panel found no deleterious mutations.  The Hereditary Gene Panel offered by [facility] includes sequencing and/or deletion duplication testing of the following 43 genes: APC, ATM, AXIN2, BARD1, BMPR1A, BRCA1, BRCA2, BRIP1, CDH1, CDKN2A (p14ARF), CDKN2A (p16INK4a), CHEK2, DICER1, EPCAM (Deletion/duplication testing only), GREM1 (promoter region deletion/duplication testing only), KIT, MEN1, MLH1, MSH2, MSH6, MUTYH, NBN, NF1, PALB2, PDGFRA, PMS2, POLD1, POLE, PTEN, RAD50, RAD51C, RAD51D, SDHB, SDHC, SDHD, SMAD4, SMARCA4. STK11, TP53, TSC1, TSC2, and  VHL.  The following gene was evaluated for sequence changes only: SDHA and HOXB13 c.251G>A variant only.  The test report has been scanned into EPIC and is located under the Molecular Pathology section of the Results Review tab.   We discussed with Patricia Hudson that since the current genetic testing is not perfect, it is possible there may be a gene mutation in one of these genes that current testing cannot detect, but that chance is small. We also discussed, that it is possible that another gene that has not yet been discovered, or that we have not yet tested, is responsible for the cancer diagnoses in the family, and it is, therefore, important to remain in touch with cancer genetics in the future so that we can continue to offer Patricia Hudson the most up to date genetic testing.   CANCER SCREENING RECOMMENDATIONS: Given Patricia Hudson's personal and family histories, we must interpret these negative results with some caution.  Families with features suggestive  of hereditary risk for cancer tend to have multiple family members with cancer, diagnoses in multiple generations and diagnoses before the age of 48. Patricia Hudson family exhibits some of these features. Thus this result may simply reflect our current inability to detect all mutations within these genes or there may be a different gene that has not yet been discovered or tested.   RECOMMENDATIONS FOR FAMILY MEMBERS: Individuals in this family might be at some increased risk of developing cancer, over the general population risk, simply due to the family history of cancer. We recommended women in this family have a yearly mammogram beginning at age 82, or 53 years younger than the earliest onset of cancer, an annual clinical breast exam, and perform monthly breast self-exams. Women in this family should also have a gynecological exam as recommended by their primary provider. All family members should have a colonoscopy by age 36 or 74 years younger than the  earliest onset of cancer.  FOLLOW-UP: Lastly, we discussed with Patricia Hudson that cancer genetics is a rapidly advancing field and it is possible that new genetic tests will be appropriate for her and/or her family members in the future. We encouraged her to remain in contact with cancer genetics on an annual basis so we can update her personal and family histories and let her know of advances in cancer genetics that may benefit this family.   Our contact number was provided. Ms. Mondesir questions were answered to her satisfaction, and she knows she is welcome to call us at anytime with additional questions or concerns.   Roma Kayser, MS, [facility] Certified Genetic Counselor [email or web address]

## 2016-11-20 ENCOUNTER — Other Ambulatory Visit: Payer: Self-pay | Admitting: Oncology

## 2016-11-21 ENCOUNTER — Ambulatory Visit: Payer: No Typology Code available for payment source

## 2016-11-21 ENCOUNTER — Telehealth: Payer: Self-pay | Admitting: Oncology

## 2016-11-21 ENCOUNTER — Telehealth: Payer: Self-pay | Admitting: *Deleted

## 2016-11-21 ENCOUNTER — Ambulatory Visit (HOSPITAL_BASED_OUTPATIENT_CLINIC_OR_DEPARTMENT_OTHER): Payer: No Typology Code available for payment source

## 2016-11-21 ENCOUNTER — Ambulatory Visit (HOSPITAL_BASED_OUTPATIENT_CLINIC_OR_DEPARTMENT_OTHER): Payer: No Typology Code available for payment source | Admitting: Oncology

## 2016-11-21 ENCOUNTER — Other Ambulatory Visit (HOSPITAL_BASED_OUTPATIENT_CLINIC_OR_DEPARTMENT_OTHER): Payer: No Typology Code available for payment source

## 2016-11-21 VITALS — BP 105/74 | HR 99 | Temp 98.9°F | Resp 18 | Wt 124.2 lb

## 2016-11-21 DIAGNOSIS — C187 Malignant neoplasm of sigmoid colon: Secondary | ICD-10-CM | POA: Diagnosis not present

## 2016-11-21 DIAGNOSIS — D701 Agranulocytosis secondary to cancer chemotherapy: Secondary | ICD-10-CM

## 2016-11-21 DIAGNOSIS — C787 Secondary malignant neoplasm of liver and intrahepatic bile duct: Secondary | ICD-10-CM | POA: Diagnosis not present

## 2016-11-21 DIAGNOSIS — Z5111 Encounter for antineoplastic chemotherapy: Secondary | ICD-10-CM

## 2016-11-21 DIAGNOSIS — D6959 Other secondary thrombocytopenia: Secondary | ICD-10-CM

## 2016-11-21 DIAGNOSIS — Z95828 Presence of other vascular implants and grafts: Secondary | ICD-10-CM

## 2016-11-21 LAB — COMPREHENSIVE METABOLIC PANEL
ALBUMIN: 3.5 g/dL (ref 3.5–5.0)
ALK PHOS: 94 U/L (ref 40–150)
ALT: 28 U/L (ref 0–55)
AST: 38 U/L — AB (ref 5–34)
Anion Gap: 8 mEq/L (ref 3–11)
BILIRUBIN TOTAL: 0.41 mg/dL (ref 0.20–1.20)
BUN: 10.1 mg/dL (ref 7.0–26.0)
CALCIUM: 9 mg/dL (ref 8.4–10.4)
CO2: 24 mEq/L (ref 22–29)
CREATININE: 0.7 mg/dL (ref 0.6–1.1)
Chloride: 110 mEq/L — ABNORMAL HIGH (ref 98–109)
EGFR: >90 mL/min/{1.73_m2} (ref >90)
GLUCOSE: 98 mg/dL (ref 70–140)
POTASSIUM: 3.8 meq/L (ref 3.5–5.1)
SODIUM: 141 meq/L (ref 136–145)
TOTAL PROTEIN: 5.9 g/dL — AB (ref 6.4–8.3)

## 2016-11-21 LAB — CBC WITH DIFFERENTIAL/PLATELET
BASO%: 1.1 % (ref 0.0–2.0)
BASOS ABS: 0 10*3/uL (ref 0.0–0.1)
EOS ABS: 0.3 10*3/uL (ref 0.0–0.5)
EOS%: 11 % — ABNORMAL HIGH (ref 0.0–7.0)
HEMATOCRIT: 32.5 % — AB (ref 34.8–46.6)
HEMOGLOBIN: 11.1 g/dL — AB (ref 11.6–15.9)
LYMPH#: 0.8 10*3/uL — AB (ref 0.9–3.3)
LYMPH%: 28.5 % (ref 14.0–49.7)
MCH: 31.4 pg (ref 25.1–34.0)
MCHC: 34.2 g/dL (ref 31.5–36.0)
MCV: 92.1 fL (ref 79.5–101.0)
MONO#: 0.5 10*3/uL (ref 0.1–0.9)
MONO%: 18.6 % — ABNORMAL HIGH (ref 0.0–14.0)
NEUT%: 40.8 % (ref 38.4–76.8)
NEUTROS ABS: 1.1 10*3/uL — AB (ref 1.5–6.5)
Platelets: 127 10*3/uL — ABNORMAL LOW (ref 145–400)
RBC: 3.53 10*6/uL — ABNORMAL LOW (ref 3.70–5.45)
RDW: 18.4 % — AB (ref 11.2–14.5)
WBC: 2.6 10*3/uL — AB (ref 3.9–10.3)

## 2016-11-21 MED ORDER — SODIUM CHLORIDE 0.9% FLUSH
10.0000 mL | INTRAVENOUS | Status: DC | PRN
  Administered 2016-11-21: 10 mL via INTRAVENOUS
  Filled 2016-11-21: qty 10

## 2016-11-21 MED ORDER — SODIUM CHLORIDE 0.9 % IV SOLN
Freq: Once | INTRAVENOUS | Status: AC
  Administered 2016-11-21: 13:00:00 via INTRAVENOUS
  Filled 2016-11-21: qty 5

## 2016-11-21 MED ORDER — PALONOSETRON HCL INJECTION 0.25 MG/5ML
0.2500 mg | Freq: Once | INTRAVENOUS | Status: AC
  Administered 2016-11-21: 0.25 mg via INTRAVENOUS

## 2016-11-21 MED ORDER — HEPARIN SOD (PORK) LOCK FLUSH 100 UNIT/ML IV SOLN
500.0000 [IU] | Freq: Once | INTRAVENOUS | Status: AC | PRN
  Administered 2016-11-21: 500 [IU]
  Filled 2016-11-21: qty 5

## 2016-11-21 MED ORDER — SODIUM CHLORIDE 0.9% FLUSH
10.0000 mL | INTRAVENOUS | Status: DC | PRN
  Administered 2016-11-21: 10 mL
  Filled 2016-11-21: qty 10

## 2016-11-21 MED ORDER — DEXTROSE 5 % IV SOLN
Freq: Once | INTRAVENOUS | Status: AC
  Administered 2016-11-21: 13:00:00 via INTRAVENOUS

## 2016-11-21 MED ORDER — PALONOSETRON HCL INJECTION 0.25 MG/5ML
INTRAVENOUS | Status: AC
  Filled 2016-11-21: qty 5

## 2016-11-21 MED ORDER — OXALIPLATIN CHEMO INJECTION 100 MG/20ML
100.0000 mg/m2 | Freq: Once | INTRAVENOUS | Status: AC
  Administered 2016-11-21: 170 mg via INTRAVENOUS
  Filled 2016-11-21: qty 34

## 2016-11-21 NOTE — Telephone Encounter (Signed)
Per LOS I have scheduled appt and gave calendar

## 2016-11-21 NOTE — Telephone Encounter (Signed)
Message sent to chemo scheduler to be added per 11/21/16 los. Appointments scheduled per 11/21/16 los.  Patient was given a copy of the AVS report and appointment schedule per 11/21/16 los.

## 2016-11-21 NOTE — Patient Instructions (Signed)

## 2016-11-21 NOTE — Progress Notes (Signed)
  [place] OFFICE PROGRESS NOTE   Diagnosis: Colon cancer  INTERVAL HISTORY:   Patricia Hudson returns as scheduled. She completed cycle 4 FOLFOX beginning 10/31/2016. No nausea following chemotherapy. She has occasional loose stool, but no frank diarrhea. She has anorexia following chemotherapy, but is eating daily. Cold sensitivity last 1 week after chemotherapy, no neuropathy symptoms today. No hand or foot pain after re-escalating the Xeloda dose.  Objective:  Vital signs in last 24 hours:  Last menstrual period 04/20/2013.    HEENT: No thrush or ulcers Resp: Lungs clear bilaterally Cardio: Regular rate and rhythm GI: No hepatomegaly, nontender Vascular: No leg edema Neuro: Very mild loss of vibratory sense at the fingertips on the right hand, normal on the left  Skin: Palms without erythema   Portacath/PICC-without erythema,, 2 mm yellowish area of discoloration-? Stitch at the medial aspect of the Port-A-Cath scar, no tenderness  Lab Results:  Lab Results  Component Value Date   WBC 4.3 10/31/2016   HGB 11.4 (L) 10/31/2016   HCT 35.1 10/31/2016   MCV 93.4 10/31/2016   PLT 111 (L) 10/31/2016   NEUTROABS 2.4 10/31/2016     Medications: I have reviewed the patient's current medications.  Assessment/Plan: 1. Adenocarcinoma of the sigmoid colon, status post a colonoscopic biopsy 07/07/2016  Mismatch repair protein expression-normal ? Sigmoid mass noted at 20 cm from the anal verge ? Staging CTs of the chest, abdomen, and pelvis negative for evidence of metastatic disease other than an indeterminate 10 mm right liver lesion ? MRI of the abdomen 07/07/2016 revealed multiple bilateral hepatic cyst. 7 mm inferior subcapsular right liver lesion suspicious for a metastasis ? PET scan 07/21/2016-hypermetabolic sigmoid colon mass, mildly hypermetabolic segment 6 liver lesion ? Laparoscopic assisted rectosigmoid colectomy and segment 8 liver resection 07/27/2016,  pathology confirmed a T3, N2b, M1tumor, 11/16 lymph nodes positive for metastatic carcinoma ? Foundation 1 testing found the tumor to be microsatellite stable, tumor mutation burden low, no BRAFor RAS mutation ? No loss of mismatch repair protein expression ? Cycle 1 CAPOX 08/22/2016 ? Cycle 2 CAPOX 09/12/2016 ? Cycle 3 CAPOX 10/03/2016 (Xeloda dose reduced to 3 tablets every morning and 2 tablets every evening beginning with cycle 3) ? Cycle 4 CAPOX 10/31/2016 (Xeloda re-escalated to original dose) ? Cycle 5 CAPOX 11/21/2016  2. Rectal bleeding secondary to #1  3. Multiple polyps on the colonoscopy 07/07/2016  Admission 07/13/2016 with acute GI bleeding secondary to an ulcer at the cecum  4. Family history of breast and pancreas cancer  5. Delayed nausea following cycle 1 CAPOX-emend added with cycle 2  6. Diarrhea following cycle 2 CAPOX  7.  Neutropenia/thrombocytopenia secondary to chemotherapy     Disposition:  [name] has completed 4 cycles of adjuvant CAPOX. She continues to tolerate the chemotherapy well. She has mild neutropenia today. We discussed the risk/benefit of proceeding with chemotherapy. She would like to proceed. She agrees to contact us for a fever or symptoms of an infection. She will return for a nadir CBC next week. [name] will return for an office visit and chemotherapy in 3 weeks. We discussed the potential for enrollment on a vaccine study at [facility]. I will refer her to Dr. Sigurd Sos. She realizes the study could close before she completes adjuvant therapy.   11/21/2016  11:20 AM

## 2016-11-21 NOTE — Patient Instructions (Signed)
[facility] Discharge Instructions for Patients Receiving Chemotherapy  Today you received the following chemotherapy agents:  Oxaliplatin  To help prevent nausea and vomiting after your treatment, we encourage you to take your nausea medication as prescribed.   If you develop nausea and vomiting that is not controlled by your nausea medication, call the clinic.   BELOW ARE SYMPTOMS THAT SHOULD BE REPORTED IMMEDIATELY:  *FEVER GREATER THAN 100.5 F  *CHILLS WITH OR WITHOUT FEVER  NAUSEA AND VOMITING THAT IS NOT CONTROLLED WITH YOUR NAUSEA MEDICATION  *UNUSUAL SHORTNESS OF BREATH  *UNUSUAL BRUISING OR BLEEDING  TENDERNESS IN MOUTH AND THROAT WITH OR WITHOUT PRESENCE OF ULCERS  *URINARY PROBLEMS  *BOWEL PROBLEMS  UNUSUAL RASH Items with * indicate a potential emergency and should be followed up as soon as possible.  Feel free to call the clinic you have any questions or concerns. The clinic phone number is [phone].  Please show the CHEMO ALERT CARD at check-in to the Emergency Department and triage nurse.   

## 2016-11-24 ENCOUNTER — Other Ambulatory Visit: Payer: Self-pay | Admitting: Oncology

## 2016-11-30 ENCOUNTER — Ambulatory Visit (HOSPITAL_BASED_OUTPATIENT_CLINIC_OR_DEPARTMENT_OTHER): Payer: No Typology Code available for payment source

## 2016-11-30 ENCOUNTER — Other Ambulatory Visit (HOSPITAL_BASED_OUTPATIENT_CLINIC_OR_DEPARTMENT_OTHER): Payer: No Typology Code available for payment source

## 2016-11-30 ENCOUNTER — Telehealth: Payer: Self-pay | Admitting: *Deleted

## 2016-11-30 VITALS — BP 110/74 | HR 102 | Temp 98.4°F | Resp 18

## 2016-11-30 DIAGNOSIS — C187 Malignant neoplasm of sigmoid colon: Secondary | ICD-10-CM

## 2016-11-30 DIAGNOSIS — Z95828 Presence of other vascular implants and grafts: Secondary | ICD-10-CM

## 2016-11-30 DIAGNOSIS — C787 Secondary malignant neoplasm of liver and intrahepatic bile duct: Secondary | ICD-10-CM | POA: Diagnosis not present

## 2016-11-30 LAB — CBC WITH DIFFERENTIAL/PLATELET
BASO%: 0.5 % (ref 0.0–2.0)
Basophils Absolute: 0 10*3/uL (ref 0.0–0.1)
EOS ABS: 0.1 10*3/uL (ref 0.0–0.5)
EOS%: 3.4 % (ref 0.0–7.0)
HEMATOCRIT: 34.2 % — AB (ref 34.8–46.6)
HGB: 11.6 g/dL (ref 11.6–15.9)
LYMPH%: 17 % (ref 14.0–49.7)
MCH: 31.4 pg (ref 25.1–34.0)
MCHC: 33.9 g/dL (ref 31.5–36.0)
MCV: 92.7 fL (ref 79.5–101.0)
MONO#: 0.3 10*3/uL (ref 0.1–0.9)
MONO%: 8.8 % (ref 0.0–14.0)
NEUT%: 70.3 % (ref 38.4–76.8)
NEUTROS ABS: 2.7 10*3/uL (ref 1.5–6.5)
NRBC: 0 % (ref 0–0)
PLATELETS: 122 10*3/uL — AB (ref 145–400)
RBC: 3.69 10*6/uL — AB (ref 3.70–5.45)
RDW: 17.3 % — AB (ref 11.2–14.5)
WBC: 3.9 10*3/uL (ref 3.9–10.3)
lymph#: 0.7 10*3/uL — ABNORMAL LOW (ref 0.9–3.3)

## 2016-11-30 MED ORDER — HEPARIN SOD (PORK) LOCK FLUSH 100 UNIT/ML IV SOLN
500.0000 [IU] | Freq: Once | INTRAVENOUS | Status: AC | PRN
  Administered 2016-11-30: 500 [IU] via INTRAVENOUS
  Filled 2016-11-30: qty 5

## 2016-11-30 MED ORDER — SODIUM CHLORIDE 0.9% FLUSH
10.0000 mL | INTRAVENOUS | Status: DC | PRN
  Administered 2016-11-30: 10 mL via INTRAVENOUS
  Filled 2016-11-30: qty 10

## 2016-11-30 NOTE — Telephone Encounter (Signed)
Call placed to patient to inform her that study has been closed at [facility] per Dr. Otilio Connors and Dr. Benay Spice would like to know if she would still like to keep appt for second opinion with Dr. Aleatha Borer.  Patient states that she does not want to keep appt with Dr. Aleatha Borer.  Dr. Benay Spice notified.

## 2016-11-30 NOTE — Progress Notes (Signed)
Patient for port flush today. Upon assessment patient complains of 4 to 8 loose bowel movements a day since starting xeloda. Patient stated they are not watery just loose. Patient states Dr. Benay Spice is aware. Patient verbalized understanding she is to call our office with any further questions or concerns.

## 2016-11-30 NOTE — Patient Instructions (Signed)

## 2016-12-10 ENCOUNTER — Other Ambulatory Visit: Payer: Self-pay | Admitting: Oncology

## 2016-12-12 ENCOUNTER — Ambulatory Visit (HOSPITAL_BASED_OUTPATIENT_CLINIC_OR_DEPARTMENT_OTHER): Payer: No Typology Code available for payment source

## 2016-12-12 ENCOUNTER — Ambulatory Visit (HOSPITAL_BASED_OUTPATIENT_CLINIC_OR_DEPARTMENT_OTHER): Payer: No Typology Code available for payment source | Admitting: Nurse Practitioner

## 2016-12-12 ENCOUNTER — Telehealth: Payer: Self-pay | Admitting: *Deleted

## 2016-12-12 ENCOUNTER — Ambulatory Visit: Payer: No Typology Code available for payment source

## 2016-12-12 ENCOUNTER — Telehealth: Payer: Self-pay | Admitting: Oncology

## 2016-12-12 ENCOUNTER — Other Ambulatory Visit: Payer: No Typology Code available for payment source

## 2016-12-12 VITALS — BP 107/65 | HR 97 | Temp 98.4°F | Resp 18 | Ht 68.0 in | Wt 119.8 lb

## 2016-12-12 DIAGNOSIS — C187 Malignant neoplasm of sigmoid colon: Secondary | ICD-10-CM | POA: Diagnosis not present

## 2016-12-12 DIAGNOSIS — Z5111 Encounter for antineoplastic chemotherapy: Secondary | ICD-10-CM

## 2016-12-12 DIAGNOSIS — C787 Secondary malignant neoplasm of liver and intrahepatic bile duct: Secondary | ICD-10-CM

## 2016-12-12 DIAGNOSIS — D6959 Other secondary thrombocytopenia: Secondary | ICD-10-CM

## 2016-12-12 DIAGNOSIS — E876 Hypokalemia: Secondary | ICD-10-CM

## 2016-12-12 LAB — CBC WITH DIFFERENTIAL/PLATELET
BASO%: 0.8 % (ref 0.0–2.0)
Basophils Absolute: 0 10*3/uL (ref 0.0–0.1)
EOS ABS: 0.2 10*3/uL (ref 0.0–0.5)
EOS%: 5.4 % (ref 0.0–7.0)
HEMATOCRIT: 32.1 % — AB (ref 34.8–46.6)
HEMOGLOBIN: 11.2 g/dL — AB (ref 11.6–15.9)
LYMPH#: 0.8 10*3/uL — AB (ref 0.9–3.3)
LYMPH%: 22.2 % (ref 14.0–49.7)
MCH: 33.3 pg (ref 25.1–34.0)
MCHC: 34.8 g/dL (ref 31.5–36.0)
MCV: 95.9 fL (ref 79.5–101.0)
MONO#: 0.8 10*3/uL (ref 0.1–0.9)
MONO%: 22.1 % — AB (ref 0.0–14.0)
NEUT%: 49.5 % (ref 38.4–76.8)
NEUTROS ABS: 1.8 10*3/uL (ref 1.5–6.5)
Platelets: 156 10*3/uL (ref 145–400)
RBC: 3.35 10*6/uL — ABNORMAL LOW (ref 3.70–5.45)
RDW: 19.3 % — AB (ref 11.2–14.5)
WBC: 3.6 10*3/uL — ABNORMAL LOW (ref 3.9–10.3)

## 2016-12-12 LAB — COMPREHENSIVE METABOLIC PANEL
ALBUMIN: 3.2 g/dL — AB (ref 3.5–5.0)
ALK PHOS: 114 U/L (ref 40–150)
ALT: 45 U/L (ref 0–55)
AST: 60 U/L — ABNORMAL HIGH (ref 5–34)
Anion Gap: 8 mEq/L (ref 3–11)
BILIRUBIN TOTAL: 0.58 mg/dL (ref 0.20–1.20)
BUN: 8.4 mg/dL (ref 7.0–26.0)
CALCIUM: 8.8 mg/dL (ref 8.4–10.4)
CO2: 24 mEq/L (ref 22–29)
Chloride: 110 mEq/L — ABNORMAL HIGH (ref 98–109)
Creatinine: 0.7 mg/dL (ref 0.6–1.1)
EGFR: >90 mL/min/{1.73_m2} (ref >90)
Glucose: 103 mg/dl (ref 70–140)
Potassium: 3.3 mEq/L — ABNORMAL LOW (ref 3.5–5.1)
SODIUM: 142 meq/L (ref 136–145)
TOTAL PROTEIN: 5.5 g/dL — AB (ref 6.4–8.3)

## 2016-12-12 MED ORDER — SODIUM CHLORIDE 0.9 % IV SOLN
Freq: Once | INTRAVENOUS | Status: AC
  Administered 2016-12-12: 13:00:00 via INTRAVENOUS
  Filled 2016-12-12: qty 5

## 2016-12-12 MED ORDER — POTASSIUM CHLORIDE CRYS ER 20 MEQ PO TBCR: 20.0000 meq | EXTENDED_RELEASE_TABLET | Freq: Every day | ORAL | 0 refills | Status: DC

## 2016-12-12 MED ORDER — DEXTROSE 5 % IV SOLN
Freq: Once | INTRAVENOUS | Status: AC
  Administered 2016-12-12: 13:00:00 via INTRAVENOUS

## 2016-12-12 MED ORDER — OXALIPLATIN CHEMO INJECTION 100 MG/20ML
100.0000 mg/m2 | Freq: Once | INTRAVENOUS | Status: AC
  Administered 2016-12-12: 170 mg via INTRAVENOUS
  Filled 2016-12-12: qty 34

## 2016-12-12 MED ORDER — SODIUM CHLORIDE 0.9% FLUSH
10.0000 mL | INTRAVENOUS | Status: DC | PRN
  Administered 2016-12-12: 10 mL
  Filled 2016-12-12: qty 10

## 2016-12-12 MED ORDER — PALONOSETRON HCL INJECTION 0.25 MG/5ML
INTRAVENOUS | Status: AC
  Filled 2016-12-12: qty 5

## 2016-12-12 MED ORDER — HEPARIN SOD (PORK) LOCK FLUSH 100 UNIT/ML IV SOLN
500.0000 [IU] | Freq: Once | INTRAVENOUS | Status: AC | PRN
  Administered 2016-12-12: 500 [IU]
  Filled 2016-12-12: qty 5

## 2016-12-12 MED ORDER — PALONOSETRON HCL INJECTION 0.25 MG/5ML
0.2500 mg | Freq: Once | INTRAVENOUS | Status: AC
  Administered 2016-12-12: 0.25 mg via INTRAVENOUS

## 2016-12-12 NOTE — Telephone Encounter (Signed)
Message sent to Chemo scheduler to be added per 12/12/16 los.  Appointments scheduled per 12/12/16 los. Patient was given a copy of the appointment schedule and AVS report, per 12/12/16 los.

## 2016-12-12 NOTE — Patient Instructions (Signed)

## 2016-12-12 NOTE — Patient Instructions (Signed)
[facility] Discharge Instructions for Patients Receiving Chemotherapy  Today you received the following chemotherapy agents Oxaliplatin  To help prevent nausea and vomiting after your treatment, we encourage you to take your nausea medication    If you develop nausea and vomiting that is not controlled by your nausea medication, call the clinic.   BELOW ARE SYMPTOMS THAT SHOULD BE REPORTED IMMEDIATELY:  *FEVER GREATER THAN 100.5 F  *CHILLS WITH OR WITHOUT FEVER  NAUSEA AND VOMITING THAT IS NOT CONTROLLED WITH YOUR NAUSEA MEDICATION  *UNUSUAL SHORTNESS OF BREATH  *UNUSUAL BRUISING OR BLEEDING  TENDERNESS IN MOUTH AND THROAT WITH OR WITHOUT PRESENCE OF ULCERS  *URINARY PROBLEMS  *BOWEL PROBLEMS  UNUSUAL RASH Items with * indicate a potential emergency and should be followed up as soon as possible.  Feel free to call the clinic you have any questions or concerns. The clinic phone number is [phone].  Please show the CHEMO ALERT CARD at check-in to the Emergency Department and triage nurse.   

## 2016-12-12 NOTE — Progress Notes (Signed)
[facility] OFFICE PROGRESS NOTE   Diagnosis:  Colon cancer  INTERVAL HISTORY:   Patricia Hudson returns as scheduled. She completed cycle 5 CAPOX beginning 11/21/2016. She denies nausea/vomiting. No mouth sores. She continues to have intermittent loose stools controlled with Imodium. For a few days she noted that the soles of both feet were red and sore. This has completely resolved. Cold sensitivity typically lasts one week. No persistent neuropathy symptoms.  Objective:  Vital signs in last 24 hours:  Blood pressure 107/65, pulse 97, temperature 98.4 F (36.9 C), temperature source Oral, resp. rate 18, height '5\' 8"'  (1.727 m), weight 119 lb 12.8 oz (54.3 kg), last menstrual period 04/20/2013, SpO2 100 %.    HEENT: No thrush or ulcers. Resp: Lungs clear bilaterally. Cardio: Regular rate and rhythm. GI: Abdomen soft and nontender. No hepatomegaly. Vascular: No leg edema. Neuro: Vibratory sense intact over the fingertips per tuning fork exam.  Skin: Palms without erythema. Port-A-Cath without erythema.    Lab Results:  Lab Results  Component Value Date   WBC 3.6 (L) 12/12/2016   HGB 11.2 (L) 12/12/2016   HCT 32.1 (L) 12/12/2016   MCV 95.9 12/12/2016   PLT 156 12/12/2016   NEUTROABS 1.8 12/12/2016    Imaging:  No results found.  Medications: I have reviewed the patient's current medications.  Assessment/Plan: 1. Adenocarcinoma of the sigmoid colon, status post a colonoscopic biopsy 07/07/2016  Mismatch repair protein expression-normal ? Sigmoid mass noted at 20 cm from the anal verge ? Staging CTs of the chest, abdomen, and pelvis negative for evidence of metastatic disease other than an indeterminate 10 mm right liver lesion ? MRI of the abdomen 07/07/2016 revealed multiple bilateral hepatic cyst. 7 mm inferior subcapsular right liver lesion suspicious for a metastasis ? PET scan 07/21/2016-hypermetabolic sigmoid colon mass, mildly hypermetabolic segment  6 liver lesion ? Laparoscopic assisted rectosigmoid colectomy and segment 8 liver resection 07/27/2016, pathology confirmed a T3, N2b, M1tumor, 11/16 lymph nodes positive for metastatic carcinoma ? Foundation 1 testing found the tumor to be microsatellite stable, tumor mutation burden low, no BRAFor RAS mutation ? No loss of mismatch repair protein expression ? Cycle 1 CAPOX 08/22/2016 ? Cycle 2 CAPOX 09/12/2016 ? Cycle 3 CAPOX 10/03/2016 (Xeloda dose reduced to 3 tablets every morning and 2 tablets every evening beginning with cycle 3) ? Cycle 4 CAPOX 10/31/2016 (Xeloda re-escalated to original dose) ? Cycle 5 CAPOX 11/21/2016 ? Cycle 6 CAPOX 12/12/2016  2. Rectal bleeding secondary to #1  3. Multiple polyps on the colonoscopy 07/07/2016  Admission 07/13/2016 with acute GI bleeding secondary to an ulcer at the cecum  4. Family history of breast and pancreas cancer  5. Delayed nausea following cycle 1 CAPOX-emend added with cycle 2  6. Diarrhea following cycle 2 CAPOX  7. Neutropenia/thrombocytopenia secondary to chemotherapy   Disposition: Patricia Hudson appears stable. She has completed 5 cycles of CAPOX. Plan to proceed with cycle 6 today as scheduled.  She has mild hypokalemia on labs today likely due to the loose stools. She will begin Kdur 20 meq daily.  She will continue Imodium as needed for the loose stools. She understands to contact the office if Imodium is not effective.  We discussed the weight loss. She will try adding in some nutritional supplements.  She will return for a follow-up visit and cycle 7 CAPOX in 3 weeks. She will contact the office in the interim as outlined above or with any other problems.  Plan reviewed  with Dr. Benay Spice.    Ned Card ANP/GNP-BC   12/12/2016  11:23 AM

## 2016-12-12 NOTE — Telephone Encounter (Signed)
Per 1/23 LOS and staff message I have scheduled appts and gave calendar

## 2016-12-15 ENCOUNTER — Other Ambulatory Visit: Payer: Self-pay | Admitting: *Deleted

## 2016-12-15 DIAGNOSIS — C187 Malignant neoplasm of sigmoid colon: Secondary | ICD-10-CM

## 2016-12-15 MED ORDER — CAPECITABINE 500 MG PO TABS: ORAL_TABLET | ORAL | 0 refills | Status: DC

## 2016-12-31 ENCOUNTER — Other Ambulatory Visit: Payer: Self-pay | Admitting: Oncology

## 2017-01-02 ENCOUNTER — Encounter: Payer: Self-pay | Admitting: *Deleted

## 2017-01-02 ENCOUNTER — Ambulatory Visit (HOSPITAL_BASED_OUTPATIENT_CLINIC_OR_DEPARTMENT_OTHER): Payer: No Typology Code available for payment source | Admitting: Oncology

## 2017-01-02 ENCOUNTER — Other Ambulatory Visit (HOSPITAL_BASED_OUTPATIENT_CLINIC_OR_DEPARTMENT_OTHER): Payer: No Typology Code available for payment source

## 2017-01-02 ENCOUNTER — Ambulatory Visit: Payer: No Typology Code available for payment source

## 2017-01-02 ENCOUNTER — Telehealth: Payer: Self-pay | Admitting: Oncology

## 2017-01-02 VITALS — BP 127/88 | HR 82 | Temp 99.2°F | Resp 18 | Ht 68.0 in | Wt 126.0 lb

## 2017-01-02 DIAGNOSIS — D701 Agranulocytosis secondary to cancer chemotherapy: Secondary | ICD-10-CM

## 2017-01-02 DIAGNOSIS — D6959 Other secondary thrombocytopenia: Secondary | ICD-10-CM

## 2017-01-02 DIAGNOSIS — Z95828 Presence of other vascular implants and grafts: Secondary | ICD-10-CM

## 2017-01-02 DIAGNOSIS — C187 Malignant neoplasm of sigmoid colon: Secondary | ICD-10-CM

## 2017-01-02 LAB — CBC WITH DIFFERENTIAL/PLATELET
BASO%: 1 % (ref 0.0–2.0)
BASOS ABS: 0 10*3/uL (ref 0.0–0.1)
EOS%: 4.4 % (ref 0.0–7.0)
Eosinophils Absolute: 0.1 10*3/uL (ref 0.0–0.5)
HCT: 29.7 % — ABNORMAL LOW (ref 34.8–46.6)
HEMOGLOBIN: 9.9 g/dL — AB (ref 11.6–15.9)
LYMPH%: 44 % (ref 14.0–49.7)
MCH: 33.6 pg (ref 25.1–34.0)
MCHC: 33.5 g/dL (ref 31.5–36.0)
MCV: 100.3 fL (ref 79.5–101.0)
MONO#: 0.4 10*3/uL (ref 0.1–0.9)
MONO%: 16.5 % — AB (ref 0.0–14.0)
NEUT#: 0.8 10*3/uL — ABNORMAL LOW (ref 1.5–6.5)
NEUT%: 34.1 % — ABNORMAL LOW (ref 38.4–76.8)
Platelets: 61 10*3/uL — ABNORMAL LOW (ref 145–400)
RBC: 2.96 10*6/uL — ABNORMAL LOW (ref 3.70–5.45)
RDW: 17.8 % — AB (ref 11.2–14.5)
WBC: 2.3 10*3/uL — AB (ref 3.9–10.3)
lymph#: 1 10*3/uL (ref 0.9–3.3)

## 2017-01-02 LAB — COMPREHENSIVE METABOLIC PANEL
ALBUMIN: 3 g/dL — AB (ref 3.5–5.0)
ALT: 36 U/L (ref 0–55)
AST: 55 U/L — AB (ref 5–34)
Alkaline Phosphatase: 128 U/L (ref 40–150)
Anion Gap: 8 mEq/L (ref 3–11)
BUN: 7.4 mg/dL (ref 7.0–26.0)
CHLORIDE: 111 meq/L — AB (ref 98–109)
CO2: 23 mEq/L (ref 22–29)
Calcium: 8.6 mg/dL (ref 8.4–10.4)
Creatinine: 0.6 mg/dL (ref 0.6–1.1)
EGFR: >90 mL/min/{1.73_m2} (ref >90)
GLUCOSE: 105 mg/dL (ref 70–140)
POTASSIUM: 3.5 meq/L (ref 3.5–5.1)
SODIUM: 142 meq/L (ref 136–145)
Total Bilirubin: 0.34 mg/dL (ref 0.20–1.20)
Total Protein: 5.4 g/dL — ABNORMAL LOW (ref 6.4–8.3)

## 2017-01-02 MED ORDER — HEPARIN SOD (PORK) LOCK FLUSH 100 UNIT/ML IV SOLN
500.0000 [IU] | Freq: Once | INTRAVENOUS | Status: AC
  Administered 2017-01-02: 500 [IU] via INTRAVENOUS
  Filled 2017-01-02: qty 5

## 2017-01-02 MED ORDER — SODIUM CHLORIDE 0.9% FLUSH
10.0000 mL | INTRAVENOUS | Status: DC | PRN
  Administered 2017-01-02: 10 mL via INTRAVENOUS
  Filled 2017-01-02: qty 10

## 2017-01-02 MED ORDER — SODIUM CHLORIDE 0.9% FLUSH
10.0000 mL | INTRAVENOUS | Status: DC | PRN
Start: 2017-01-02 — End: 2017-01-03
  Administered 2017-01-02: 10 mL via INTRAVENOUS
  Filled 2017-01-02: qty 10

## 2017-01-02 NOTE — Progress Notes (Signed)
  [facility] OFFICE PROGRESS NOTE   Diagnosis: Colon cancer  INTERVAL HISTORY:   Patricia Hudson returns as scheduled. She completed cycle 6 CAPOX beginning 12/12/2016.she had cold sensitivity following chemotherapy. No neuropathy symptoms at present. She reports developing diarrhea over the last week of chemotherapy and she discontinued the Xeloda on day 11. She did not take Imodium. The diarrhea has resolved. She feels well. She is working.  Objective:  Vital signs in last 24 hours:     HEENT: no thrush or ulcers Resp: lungs clear bilaterally Cardio: regular rate and rhythm GI: no hepatomegaly Vascular: no leg edema Neuro:mild loss of vibratory sense at the fingertips bilaterally  Skin:Palms without erythema, mild erythema at the soles   Portacath/PICC-without erythema  Lab Results:  Lab Results  Component Value Date   WBC 2.3 (L) 01/02/2017   HGB 9.9 (L) 01/02/2017   HCT 29.7 (L) 01/02/2017   MCV 100.3 01/02/2017   PLT 61 (L) 01/02/2017   NEUTROABS 0.8 (L) 01/02/2017     Medications: I have reviewed the patient's current medications.  Assessment/Plan: 1. Adenocarcinoma of the sigmoid colon, status post a colonoscopic biopsy 07/07/2016  Mismatch repair protein expression-normal ? Sigmoid mass noted at 20 cm from the anal verge ? Staging CTs of the chest, abdomen, and pelvis negative for evidence of metastatic disease other than an indeterminate 10 mm right liver lesion ? MRI of the abdomen 07/07/2016 revealed multiple bilateral hepatic cyst. 7 mm inferior subcapsular right liver lesion suspicious for a metastasis ? PET scan 07/21/2016-hypermetabolic sigmoid colon mass, mildly hypermetabolic segment 6 liver lesion ? Laparoscopic assisted rectosigmoid colectomy and segment 8 liver resection 07/27/2016, pathology confirmed a T3, N2b, M1tumor, 11/16 lymph nodes positive for metastatic carcinoma ? Foundation 1 testing found the tumor to be microsatellite  stable, tumor mutation burden low, no BRAFor RAS mutation ? No loss of mismatch repair protein expression ? Cycle 1 CAPOX 08/22/2016 ? Cycle 2 CAPOX 09/12/2016 ? Cycle 3 CAPOX 10/03/2016 (Xeloda dose reduced to 3 tablets every morning and 2 tablets every evening beginning with cycle 3) ? Cycle 4 CAPOX 10/31/2016 (Xeloda re-escalated to original dose) ? Cycle 5 CAPOX 11/21/2016 ? Cycle 6 CAPOX 12/12/2016  2. Rectal bleeding secondary to #1  3. Multiple polyps on the colonoscopy 07/07/2016  Admission 07/13/2016 with acute GI bleeding secondary to an ulcer at the cecum  4. Family history of breast and pancreas cancer  5. Delayed nausea following cycle 1 CAPOX-emend added with cycle 2  6. Diarrhea following cycle 2 and cycle 6 CAPOX  7. Neutropenia/thrombocytopenia secondary to chemotherapy    Disposition:  Patricia Hudson appears well. She has neutropenia and thrombocytopenia secondary to chemotherapy. Cycle 7 CAPOX will be held for one week.She had prolonged diarrhea following the last cycle of chemotherapy.Xeloda will be dose reduced with cycle 7. She knows to contact us and discontinue Xeloda for recurrent diarrhea. I recommended she use Imodium for diarrhea.  She will return for an office visit prior to the final planned cycle of adjuvant therapy on 01/30/2017.  25 minutes were spent with the patient today. The majority of the time was used for counseling and coordination of care.  Betsy Coder, MD  01/02/2017  11:23 AM

## 2017-01-02 NOTE — Progress Notes (Signed)
Nutrition:  Unable to meet with patient today during infusion as infusion not completed today.  Patient was identified on Malnutrition Screening Report for positive weight loss and poor appetite. Will follow-up as able.  Valery Chance B. Zenia Resides, [place], [place] (pager)

## 2017-01-02 NOTE — Telephone Encounter (Signed)
Appointments scheduled per 2/13 LOS. Patient given AVS report and calendars with future scheduled appointments. °

## 2017-01-04 ENCOUNTER — Telehealth: Payer: Self-pay | Admitting: *Deleted

## 2017-01-04 NOTE — Telephone Encounter (Signed)
Per 2/13 LOS and staff message I have scheduled appt. Notified the scheduler 

## 2017-01-09 ENCOUNTER — Ambulatory Visit (HOSPITAL_BASED_OUTPATIENT_CLINIC_OR_DEPARTMENT_OTHER): Payer: No Typology Code available for payment source

## 2017-01-09 ENCOUNTER — Other Ambulatory Visit (HOSPITAL_BASED_OUTPATIENT_CLINIC_OR_DEPARTMENT_OTHER): Payer: No Typology Code available for payment source

## 2017-01-09 ENCOUNTER — Other Ambulatory Visit: Payer: No Typology Code available for payment source

## 2017-01-09 VITALS — BP 112/76 | HR 87 | Temp 98.5°F | Resp 18

## 2017-01-09 DIAGNOSIS — C187 Malignant neoplasm of sigmoid colon: Secondary | ICD-10-CM | POA: Diagnosis not present

## 2017-01-09 DIAGNOSIS — Z95828 Presence of other vascular implants and grafts: Secondary | ICD-10-CM

## 2017-01-09 LAB — CBC WITH DIFFERENTIAL/PLATELET
BASO%: 1.1 % (ref 0.0–2.0)
BASOS ABS: 0 10*3/uL (ref 0.0–0.1)
EOS%: 6.3 % (ref 0.0–7.0)
Eosinophils Absolute: 0.2 10*3/uL (ref 0.0–0.5)
HCT: 33.1 % — ABNORMAL LOW (ref 34.8–46.6)
HEMOGLOBIN: 11.1 g/dL — AB (ref 11.6–15.9)
LYMPH%: 40.7 % (ref 14.0–49.7)
MCH: 34.1 pg — AB (ref 25.1–34.0)
MCHC: 33.5 g/dL (ref 31.5–36.0)
MCV: 101.8 fL — ABNORMAL HIGH (ref 79.5–101.0)
MONO#: 0.4 10*3/uL (ref 0.1–0.9)
MONO%: 14.4 % — AB (ref 0.0–14.0)
NEUT%: 37.5 % — ABNORMAL LOW (ref 38.4–76.8)
NEUTROS ABS: 1.1 10*3/uL — AB (ref 1.5–6.5)
Platelets: 59 10*3/uL — ABNORMAL LOW (ref 145–400)
RBC: 3.26 10*6/uL — ABNORMAL LOW (ref 3.70–5.45)
RDW: 16.7 % — AB (ref 11.2–14.5)
WBC: 2.8 10*3/uL — AB (ref 3.9–10.3)
lymph#: 1.1 10*3/uL (ref 0.9–3.3)

## 2017-01-09 MED ORDER — SODIUM CHLORIDE 0.9% FLUSH
10.0000 mL | INTRAVENOUS | Status: DC | PRN
  Administered 2017-01-09: 10 mL via INTRAVENOUS
  Filled 2017-01-09: qty 10

## 2017-01-09 MED ORDER — HEPARIN SOD (PORK) LOCK FLUSH 100 UNIT/ML IV SOLN
500.0000 [IU] | Freq: Once | INTRAVENOUS | Status: AC | PRN
  Administered 2017-01-09: 500 [IU] via INTRAVENOUS
  Filled 2017-01-09: qty 5

## 2017-01-09 NOTE — Progress Notes (Signed)
Reviewed labs with Dr. Benay Spice, pt to continue Xeloda this cycle and hold Oxaliplatin. Pt to have labs only on 01/18/17, and lab, flush, MD appt and infusion 01/30/17. appts made and pt aware and schedule given to pt, pt verbalizes understanding. Pt to call clinic with any changes or concerns. Pt stable at discharge.

## 2017-01-09 NOTE — Patient Instructions (Signed)

## 2017-01-09 NOTE — Progress Notes (Signed)
OK to treat with CMET from 01/02/17 per Dr. Benay Spice.

## 2017-01-11 ENCOUNTER — Telehealth: Payer: Self-pay | Admitting: *Deleted

## 2017-01-11 NOTE — Telephone Encounter (Signed)
Message left for pt to call [place] back to confirm amount of Xeloda on hand.

## 2017-01-15 ENCOUNTER — Telehealth: Payer: Self-pay | Admitting: *Deleted

## 2017-01-15 NOTE — Telephone Encounter (Signed)
Left message with patient to confirm amount of Xeloda currently on hand.

## 2017-01-18 ENCOUNTER — Telehealth: Payer: Self-pay | Admitting: *Deleted

## 2017-01-18 ENCOUNTER — Other Ambulatory Visit (HOSPITAL_BASED_OUTPATIENT_CLINIC_OR_DEPARTMENT_OTHER): Payer: No Typology Code available for payment source

## 2017-01-18 DIAGNOSIS — C187 Malignant neoplasm of sigmoid colon: Secondary | ICD-10-CM | POA: Diagnosis not present

## 2017-01-18 LAB — CBC WITH DIFFERENTIAL/PLATELET
BASO%: 0.7 % (ref 0.0–2.0)
Basophils Absolute: 0 10*3/uL (ref 0.0–0.1)
EOS ABS: 0.2 10*3/uL (ref 0.0–0.5)
EOS%: 5.6 % (ref 0.0–7.0)
HEMATOCRIT: 34.7 % — AB (ref 34.8–46.6)
HEMOGLOBIN: 11.6 g/dL (ref 11.6–15.9)
LYMPH#: 1.6 10*3/uL (ref 0.9–3.3)
LYMPH%: 36.9 % (ref 14.0–49.7)
MCH: 33.5 pg (ref 25.1–34.0)
MCHC: 33.4 g/dL (ref 31.5–36.0)
MCV: 100.3 fL (ref 79.5–101.0)
MONO#: 0.5 10*3/uL (ref 0.1–0.9)
MONO%: 12.1 % (ref 0.0–14.0)
NEUT#: 1.9 10*3/uL (ref 1.5–6.5)
NEUT%: 44.7 % (ref 38.4–76.8)
PLATELETS: 117 10*3/uL — AB (ref 145–400)
RBC: 3.46 10*6/uL — ABNORMAL LOW (ref 3.70–5.45)
RDW: 15.2 % — ABNORMAL HIGH (ref 11.2–14.5)
WBC: 4.3 10*3/uL (ref 3.9–10.3)
nRBC: 0 % (ref 0–0)

## 2017-01-18 NOTE — Telephone Encounter (Signed)
Call placed to patient to notify her per order of Dr. Benay Spice that wbcs and platelets look good.   Patient appreciative of call and has no questions at this time.  She states that she is tolerating Xeloda better with the 3 in AM and 2 in PM dose.

## 2017-01-18 NOTE — Telephone Encounter (Signed)
-----   Message from Ladell Pier, MD sent at 01/18/2017  1:35 PM EST ----- Please call patient, wbcs and platelets look good

## 2017-01-23 ENCOUNTER — Ambulatory Visit: Payer: No Typology Code available for payment source | Admitting: Oncology

## 2017-01-23 ENCOUNTER — Ambulatory Visit: Payer: No Typology Code available for payment source

## 2017-01-23 ENCOUNTER — Other Ambulatory Visit: Payer: No Typology Code available for payment source

## 2017-01-28 ENCOUNTER — Other Ambulatory Visit: Payer: Self-pay | Admitting: Oncology

## 2017-01-30 ENCOUNTER — Ambulatory Visit (HOSPITAL_BASED_OUTPATIENT_CLINIC_OR_DEPARTMENT_OTHER): Payer: No Typology Code available for payment source

## 2017-01-30 ENCOUNTER — Telehealth: Payer: Self-pay | Admitting: Oncology

## 2017-01-30 ENCOUNTER — Ambulatory Visit (HOSPITAL_BASED_OUTPATIENT_CLINIC_OR_DEPARTMENT_OTHER): Payer: No Typology Code available for payment source | Admitting: Oncology

## 2017-01-30 ENCOUNTER — Ambulatory Visit: Payer: No Typology Code available for payment source

## 2017-01-30 ENCOUNTER — Other Ambulatory Visit (HOSPITAL_BASED_OUTPATIENT_CLINIC_OR_DEPARTMENT_OTHER): Payer: No Typology Code available for payment source

## 2017-01-30 VITALS — BP 143/74 | HR 105 | Temp 98.4°F | Resp 18 | Ht 68.0 in | Wt 137.6 lb

## 2017-01-30 DIAGNOSIS — D701 Agranulocytosis secondary to cancer chemotherapy: Secondary | ICD-10-CM

## 2017-01-30 DIAGNOSIS — G620 Drug-induced polyneuropathy: Secondary | ICD-10-CM

## 2017-01-30 DIAGNOSIS — C187 Malignant neoplasm of sigmoid colon: Secondary | ICD-10-CM | POA: Diagnosis not present

## 2017-01-30 DIAGNOSIS — Z5111 Encounter for antineoplastic chemotherapy: Secondary | ICD-10-CM | POA: Diagnosis not present

## 2017-01-30 DIAGNOSIS — C787 Secondary malignant neoplasm of liver and intrahepatic bile duct: Secondary | ICD-10-CM

## 2017-01-30 LAB — COMPREHENSIVE METABOLIC PANEL
ALT: 16 U/L (ref 0–55)
AST: 27 U/L (ref 5–34)
Albumin: 3.7 g/dL (ref 3.5–5.0)
Alkaline Phosphatase: 126 U/L (ref 40–150)
Anion Gap: 8 mEq/L (ref 3–11)
BUN: 11.2 mg/dL (ref 7.0–26.0)
CO2: 23 meq/L (ref 22–29)
CREATININE: 0.7 mg/dL (ref 0.6–1.1)
Calcium: 9.2 mg/dL (ref 8.4–10.4)
Chloride: 112 mEq/L — ABNORMAL HIGH (ref 98–109)
EGFR: >90 mL/min/{1.73_m2} (ref >90)
GLUCOSE: 99 mg/dL (ref 70–140)
Potassium: 4.2 mEq/L (ref 3.5–5.1)
SODIUM: 143 meq/L (ref 136–145)
TOTAL PROTEIN: 6.5 g/dL (ref 6.4–8.3)
Total Bilirubin: 0.41 mg/dL (ref 0.20–1.20)

## 2017-01-30 LAB — CBC WITH DIFFERENTIAL/PLATELET
BASO%: 1 % (ref 0.0–2.0)
Basophils Absolute: 0 10*3/uL (ref 0.0–0.1)
EOS ABS: 0.1 10*3/uL (ref 0.0–0.5)
EOS%: 4.7 % (ref 0.0–7.0)
HCT: 32.8 % — ABNORMAL LOW (ref 34.8–46.6)
HEMOGLOBIN: 11.1 g/dL — AB (ref 11.6–15.9)
LYMPH#: 1 10*3/uL (ref 0.9–3.3)
LYMPH%: 33.1 % (ref 14.0–49.7)
MCH: 33.6 pg (ref 25.1–34.0)
MCHC: 33.8 g/dL (ref 31.5–36.0)
MCV: 99.4 fL (ref 79.5–101.0)
MONO#: 0.5 10*3/uL (ref 0.1–0.9)
MONO%: 16.2 % — AB (ref 0.0–14.0)
NEUT%: 45 % (ref 38.4–76.8)
NEUTROS ABS: 1.3 10*3/uL — AB (ref 1.5–6.5)
NRBC: 0 % (ref 0–0)
PLATELETS: 87 10*3/uL — AB (ref 145–400)
RBC: 3.3 10*6/uL — AB (ref 3.70–5.45)
RDW: 15.7 % — ABNORMAL HIGH (ref 11.2–14.5)
WBC: 3 10*3/uL — AB (ref 3.9–10.3)

## 2017-01-30 MED ORDER — SODIUM CHLORIDE 0.9% FLUSH
10.0000 mL | INTRAVENOUS | Status: DC | PRN
  Administered 2017-01-30: 10 mL
  Filled 2017-01-30: qty 10

## 2017-01-30 MED ORDER — PALONOSETRON HCL INJECTION 0.25 MG/5ML
INTRAVENOUS | Status: AC
  Filled 2017-01-30: qty 5

## 2017-01-30 MED ORDER — PALONOSETRON HCL INJECTION 0.25 MG/5ML
0.2500 mg | Freq: Once | INTRAVENOUS | Status: AC
Start: 2017-01-30 — End: 2017-01-30
  Administered 2017-01-30: 0.25 mg via INTRAVENOUS

## 2017-01-30 MED ORDER — HEPARIN SOD (PORK) LOCK FLUSH 100 UNIT/ML IV SOLN
500.0000 [IU] | Freq: Once | INTRAVENOUS | Status: AC | PRN
  Administered 2017-01-30: 500 [IU]
  Filled 2017-01-30: qty 5

## 2017-01-30 MED ORDER — DEXTROSE 5 % IV SOLN
Freq: Once | INTRAVENOUS | Status: AC
  Administered 2017-01-30: 12:00:00 via INTRAVENOUS

## 2017-01-30 MED ORDER — ATROPINE SULFATE 1 MG/ML IJ SOLN
INTRAMUSCULAR | Status: AC
  Filled 2017-01-30: qty 1

## 2017-01-30 MED ORDER — OXALIPLATIN CHEMO INJECTION 100 MG/20ML
80.0000 mg/m2 | Freq: Once | INTRAVENOUS | Status: AC
  Administered 2017-01-30: 135 mg via INTRAVENOUS
  Filled 2017-01-30: qty 20

## 2017-01-30 MED ORDER — SODIUM CHLORIDE 0.9 % IV SOLN
Freq: Once | INTRAVENOUS | Status: AC
  Administered 2017-01-30: 13:00:00 via INTRAVENOUS
  Filled 2017-01-30: qty 5

## 2017-01-30 NOTE — Telephone Encounter (Signed)
CT scheduled with GI for 02/27/17 @ 11 a.m. Patient is to arrive at 10:40 a.m. N.P.O. After 7 a.m. Arrive at 10:40 a.m. Per [place] @ Gi. 2 bottles of contrast  and a copy of instructions was given to patient, per CT ordered. Patient is aware. Appointments scheduled per 01/30/17 los. Patient was given a copy of the AVS report and appointment schedule, per 01/30/17 los.

## 2017-01-30 NOTE — Progress Notes (Signed)
[place] OFFICE PROGRESS NOTE   Diagnosis: Colon cancer  INTERVAL HISTORY:   Patricia Hudson returns as scheduled. She completed another cycle of CAPOX the getting 01/09/2017. Oxaliplatin was held secondary to thrombocytopenia. She reports tolerating the Xeloda well. No mouth sores, hand/foot pain, or diarrhea. Her appetite is improved. She has very mild numbness in the hands that does not interfere with activity. She describes a numbness as "1% "less than normal. This has improved since the last treatment with oxaliplatin. She is working. No other complaint.  Objective:  Vital signs in last 24 hours:  Blood pressure (!) 143/74, pulse (!) 105, temperature 98.4 F (36.9 C), temperature source Oral, resp. rate 18, height '5\' 8"'  (1.727 m), weight 137 lb 9.6 oz (62.4 kg), last menstrual period 04/20/2013, SpO2 100 %.    HEENT: No thrush or ulcers Resp: Lungs clear bilaterally Cardio: Regular rate and rhythm GI: No hepatomegaly, nontender Vascular: No leg edema Neuro: Mild decrease in vibratory sense at the fingertips bilaterally  Skin: Palms without erythema   Portacath/PICC-without erythema  Lab Results:  Lab Results  Component Value Date   WBC 3.0 (L) 01/30/2017   HGB 11.1 (L) 01/30/2017   HCT 32.8 (L) 01/30/2017   MCV 99.4 01/30/2017   PLT 87 (L) 01/30/2017   NEUTROABS 1.3 (L) 01/30/2017     Medications: I have reviewed the patient's current medications.  Assessment/Plan: 1. Adenocarcinoma of the sigmoid colon, status post a colonoscopic biopsy 07/07/2016  Mismatch repair protein expression-normal ? Sigmoid mass noted at 20 cm from the anal verge ? Staging CTs of the chest, abdomen, and pelvis negative for evidence of metastatic disease other than an indeterminate 10 mm right liver lesion ? MRI of the abdomen 07/07/2016 revealed multiple bilateral hepatic cyst. 7 mm inferior subcapsular right liver lesion suspicious for a metastasis ? PET scan  07/21/2016-hypermetabolic sigmoid colon mass, mildly hypermetabolic segment 6 liver lesion ? Laparoscopic assisted rectosigmoid colectomy and segment 8 liver resection 07/27/2016, pathology confirmed a T3, N2b, M1tumor, 11/16 lymph nodes positive for metastatic carcinoma ? Foundation 1 testing found the tumor to be microsatellite stable, tumor mutation burden low, no BRAFor RAS mutation ? No loss of mismatch repair protein expression ? Cycle 1 CAPOX 08/22/2016 ? Cycle 2 CAPOX 09/12/2016 ? Cycle 3 CAPOX 10/03/2016 (Xeloda dose reduced to 3 tablets every morning and 2 tablets every evening beginning with cycle 3) ? Cycle 4 CAPOX 10/31/2016 (Xeloda re-escalated to original dose) ? Cycle 5 CAPOX 11/21/2016 ? Cycle 6 CAPOX 12/12/2016 ? Cycle 7 CAPOX 01/09/2017 (oxaliplatin held) ? Cycle 8 CAPOX 01/30/2017 (oxaliplatin dose reduced)  2. Rectal bleeding secondary to #1  3. Multiple polyps on the colonoscopy 07/07/2016  Admission 07/13/2016 with acute GI bleeding secondary to an ulcer at the cecum  4. Family history of breast and pancreas cancer  5. Delayed nausea following cycle 1 CAPOX-emend added with cycle 2  6. Diarrhea following cycle 2 and cycle 6 CAPOX  7. Neutropenia/thrombocytopenia secondary to chemotherapy  8.  Mild oxaliplatin neuropathy     Disposition:  Ms. Caponigro has completed 7 cycles of adjuvant therapy. She has persistent mild neutropenia and thrombocytopenia secondary to chemotherapy. Has mild oxaliplatin neuropathy.  I discussed the risk/benefit of proceeding with cycle 8 today. She would like to proceed with chemotherapy. She knows to contact us for a fever or bleeding. She understands the neuropathy symptoms could progress with additional oxaliplatin. I will dose reduce the oxaliplatin further with this cycle secondary to the  neuropathy symptoms and cytopenias.  She will return for a nadir CBC on 02/12/2017. Ms. Doyle Askew will be scheduled for  restaging CTs and an office visit on 02/27/2017.  25 minutes were spent with the patient today. The majority of the time was used for counseling and coordination of care.  Betsy Coder, MD  01/30/2017  11:14 AM

## 2017-01-30 NOTE — Progress Notes (Signed)
Per Dr. Benay Spice: OK to treat with ANC 1.3 and PLT 87. Oxaliplatin dose reduced. Joni, Infusion RN made aware.

## 2017-01-30 NOTE — Patient Instructions (Signed)

## 2017-02-01 ENCOUNTER — Telehealth: Payer: Self-pay | Admitting: *Deleted

## 2017-02-01 NOTE — Telephone Encounter (Signed)
Message from pt reporting she has sufficient quantity of Xeloda to complete treatment. She will not need refill.

## 2017-02-12 ENCOUNTER — Other Ambulatory Visit (HOSPITAL_BASED_OUTPATIENT_CLINIC_OR_DEPARTMENT_OTHER): Payer: No Typology Code available for payment source

## 2017-02-12 DIAGNOSIS — C187 Malignant neoplasm of sigmoid colon: Secondary | ICD-10-CM

## 2017-02-12 LAB — CBC WITH DIFFERENTIAL/PLATELET
BASO%: 0.8 % (ref 0.0–2.0)
BASOS ABS: 0 10*3/uL (ref 0.0–0.1)
EOS ABS: 0.1 10*3/uL (ref 0.0–0.5)
EOS%: 2.9 % (ref 0.0–7.0)
HCT: 36.3 % (ref 34.8–46.6)
HGB: 12.1 g/dL (ref 11.6–15.9)
LYMPH%: 28.1 % (ref 14.0–49.7)
MCH: 33.8 pg (ref 25.1–34.0)
MCHC: 33.3 g/dL (ref 31.5–36.0)
MCV: 101.6 fL — ABNORMAL HIGH (ref 79.5–101.0)
MONO#: 0.5 10*3/uL (ref 0.1–0.9)
MONO%: 12.5 % (ref 0.0–14.0)
NEUT#: 2.4 10*3/uL (ref 1.5–6.5)
NEUT%: 55.7 % (ref 38.4–76.8)
PLATELETS: 99 10*3/uL — AB (ref 145–400)
RBC: 3.58 10*6/uL — AB (ref 3.70–5.45)
RDW: 16.5 % — ABNORMAL HIGH (ref 11.2–14.5)
WBC: 4.2 10*3/uL (ref 3.9–10.3)
lymph#: 1.2 10*3/uL (ref 0.9–3.3)

## 2017-02-13 ENCOUNTER — Telehealth: Payer: Self-pay | Admitting: *Deleted

## 2017-02-13 NOTE — Telephone Encounter (Signed)
-----   Message from Ladell Pier, MD sent at 02/12/2017  6:18 PM EDT ----- Please call patient, cbc looks good, have great spring break

## 2017-02-13 NOTE — Telephone Encounter (Signed)
Called pt with CBC result. She voiced understanding, stated she feels great.

## 2017-02-27 ENCOUNTER — Telehealth: Payer: Self-pay | Admitting: Oncology

## 2017-02-27 ENCOUNTER — Ambulatory Visit (HOSPITAL_BASED_OUTPATIENT_CLINIC_OR_DEPARTMENT_OTHER): Payer: No Typology Code available for payment source | Admitting: Oncology

## 2017-02-27 ENCOUNTER — Ambulatory Visit
Admission: RE | Admit: 2017-02-27 | Discharge: 2017-02-27 | Disposition: A | Discharge status: Home / Self Care | Payer: No Typology Code available for payment source | Source: Ambulatory Visit | Attending: Oncology | Admitting: Oncology

## 2017-02-27 ENCOUNTER — Other Ambulatory Visit (HOSPITAL_BASED_OUTPATIENT_CLINIC_OR_DEPARTMENT_OTHER): Payer: No Typology Code available for payment source

## 2017-02-27 ENCOUNTER — Ambulatory Visit: Payer: No Typology Code available for payment source

## 2017-02-27 VITALS — BP 126/88 | HR 98 | Temp 98.6°F | Resp 20 | Ht 68.0 in | Wt 141.6 lb

## 2017-02-27 DIAGNOSIS — Z95828 Presence of other vascular implants and grafts: Secondary | ICD-10-CM

## 2017-02-27 DIAGNOSIS — G620 Drug-induced polyneuropathy: Secondary | ICD-10-CM | POA: Diagnosis not present

## 2017-02-27 DIAGNOSIS — C187 Malignant neoplasm of sigmoid colon: Secondary | ICD-10-CM

## 2017-02-27 DIAGNOSIS — D701 Agranulocytosis secondary to cancer chemotherapy: Secondary | ICD-10-CM | POA: Diagnosis not present

## 2017-02-27 LAB — CBC WITH DIFFERENTIAL/PLATELET
BASO%: 1 % (ref 0.0–2.0)
BASOS ABS: 0 10*3/uL (ref 0.0–0.1)
EOS ABS: 0.1 10*3/uL (ref 0.0–0.5)
EOS%: 3.5 % (ref 0.0–7.0)
HCT: 36.5 % (ref 34.8–46.6)
HEMOGLOBIN: 12.3 g/dL (ref 11.6–15.9)
LYMPH%: 35.1 % (ref 14.0–49.7)
MCH: 33.8 pg (ref 25.1–34.0)
MCHC: 33.7 g/dL (ref 31.5–36.0)
MCV: 100.3 fL (ref 79.5–101.0)
MONO#: 0.4 10*3/uL (ref 0.1–0.9)
MONO%: 14.1 % — ABNORMAL HIGH (ref 0.0–14.0)
NEUT%: 46.3 % (ref 38.4–76.8)
NEUTROS ABS: 1.5 10*3/uL (ref 1.5–6.5)
Platelets: 78 10*3/uL — ABNORMAL LOW (ref 145–400)
RBC: 3.64 10*6/uL — ABNORMAL LOW (ref 3.70–5.45)
RDW: 15 % — AB (ref 11.2–14.5)
WBC: 3.1 10*3/uL — ABNORMAL LOW (ref 3.9–10.3)
lymph#: 1.1 10*3/uL (ref 0.9–3.3)

## 2017-02-27 LAB — CEA (IN HOUSE-CHCC): CEA (CHCC-In House): 3.54 ng/mL (ref 0.00–5.00)

## 2017-02-27 MED ORDER — SODIUM CHLORIDE 0.9% FLUSH
10.0000 mL | INTRAVENOUS | Status: DC | PRN
  Administered 2017-02-27: 10 mL via INTRAVENOUS
  Filled 2017-02-27: qty 10

## 2017-02-27 MED ORDER — IOPAMIDOL (ISOVUE-300) INJECTION 61%
100.0000 mL | Freq: Once | INTRAVENOUS | Status: AC | PRN
  Administered 2017-02-27: 100 mL via INTRAVENOUS

## 2017-02-27 NOTE — Telephone Encounter (Signed)
Gave patient AVS and calender per 4/10 los.  

## 2017-02-27 NOTE — Progress Notes (Signed)
[place] OFFICE PROGRESS NOTE   Diagnosis: Colon cancer  INTERVAL HISTORY:   Patricia Hudson returns as scheduled. She completed a final cycle of adjuvant CAPOX beginning 01/30/2017. She has noted an increase in peripheral numbness. This does not interfere with activity. No other complaint. Good appetite and energy level. She continues to work.  Objective:  Vital signs in last 24 hours:  Blood pressure 126/88, pulse 98, temperature 98.6 F (37 C), temperature source Oral, resp. rate 20, height '5\' 8"'  (1.727 m), weight 141 lb 9.6 oz (64.2 kg), last menstrual period 04/20/2013, SpO2 99 %.    HEENT: Neck without mass Lymphatics: No cervical, supraclavicular, axillary, or inguinal nodes Resp: Lungs clear bilaterally Cardio: Regular rate and rhythm GI: No hepatosplenomegaly, no mass, nontender Vascular: No leg edema Neuro: Mild loss of vibratory sense at the fingertips bilaterally    Portacath/PICC-without erythema  Lab Results:  Lab Results  Component Value Date   WBC 3.1 (L) 02/27/2017   HGB 12.3 02/27/2017   HCT 36.5 02/27/2017   MCV 100.3 02/27/2017   PLT 78 (L) 02/27/2017   NEUTROABS 1.5 02/27/2017   CEA-3.54   Imaging:  Ct Chest W Contrast  Result Date: 02/27/2017 CLINICAL DATA:  Colorectal carcinoma. Subsequent treatment evaluation. Partial colectomy liver resection July 27 2016 EXAM: CT CHEST, ABDOMEN, AND PELVIS WITH CONTRAST TECHNIQUE: Multidetector CT imaging of the chest, abdomen and pelvis was performed following the standard protocol during bolus administration of intravenous contrast. CONTRAST:  178m ISOVUE-300 IOPAMIDOL (ISOVUE-300) INJECTION 61% COMPARISON:  07/21/2016 FINDINGS: CT CHEST FINDINGS Cardiovascular: Port in the RIGHT chest wall. No significant vascular findings. Normal heart size. No pericardial effusion. Mediastinum/Nodes: No axillary or supraclavicular lymphadenopathy. No mediastinal hilar lymphadenopathy. No pericardial  fluid. Lungs/Pleura: Lungs are clear. No pulmonary nodules. Airways normal. Musculoskeletal: No aggressive osseous lesion. CT ABDOMEN AND PELVIS FINDINGS Hepatobiliary: The small lesion inferior RIGHT hepatic lobe has since been resected. No evidence of nodularity along the resection margin adjacent to the surgical staple line. There are multiple additional benign cystic lesions within the LEFT and RIGHT hepatic lobe which are unchanged. No new hepatic lesions. Gallbladder the biliary tree normal. Pancreas: Pancreas is normal. No ductal dilatation. No pancreatic inflammation. Spleen: Normal spleen Adrenals/urinary tract: Adrenal glands and kidneys are normal. The ureters and bladder normal. Stomach/Bowel: Stomach, small-bowel appendix and cecum normal. Moderate volume stool throughout the colon. sigmoid anastomosis without obstruction. Large volume stool in the rectum. Vascular/Lymphatic: Abdominal aorta is normal caliber. There is no retroperitoneal or periportal lymphadenopathy. No pelvic lymphadenopathy. Reproductive: Uterus and ovaries normal Other: No free fluid. Musculoskeletal: No aggressive osseous lesion. IMPRESSION: Chest Impression: 1. No evidence of thoracic metastasis. 2. Port in proper position. Abdomen / Pelvis Impression: 1. Interval resection of small inferior RIGHT hepatic lobe lesion. 2. No evidence of new hepatic metastasis. Stable hepatic cysts again demonstrated. 3. Interval partial sigmoid colon resection. 4. Large volume stool in the rectum. Moderate volume stool throughout the colon. 5. No evidence of metastatic adenopathy or peritoneal metastasis. Electronically Signed   By: SSuzy BouchardM.D.   On: 02/27/2017 11:59   Ct Abdomen Pelvis W Contrast  Result Date: 02/27/2017 CLINICAL DATA:  Colorectal carcinoma. Subsequent treatment evaluation. Partial colectomy liver resection July 27 2016 EXAM: CT CHEST, ABDOMEN, AND PELVIS WITH CONTRAST TECHNIQUE: Multidetector CT imaging of the  chest, abdomen and pelvis was performed following the standard protocol during bolus administration of intravenous contrast. CONTRAST:  1033mISOVUE-300 IOPAMIDOL (ISOVUE-300) INJECTION 61% COMPARISON:  07/21/2016  FINDINGS: CT CHEST FINDINGS Cardiovascular: Port in the RIGHT chest wall. No significant vascular findings. Normal heart size. No pericardial effusion. Mediastinum/Nodes: No axillary or supraclavicular lymphadenopathy. No mediastinal hilar lymphadenopathy. No pericardial fluid. Lungs/Pleura: Lungs are clear. No pulmonary nodules. Airways normal. Musculoskeletal: No aggressive osseous lesion. CT ABDOMEN AND PELVIS FINDINGS Hepatobiliary: The small lesion inferior RIGHT hepatic lobe has since been resected. No evidence of nodularity along the resection margin adjacent to the surgical staple line. There are multiple additional benign cystic lesions within the LEFT and RIGHT hepatic lobe which are unchanged. No new hepatic lesions. Gallbladder the biliary tree normal. Pancreas: Pancreas is normal. No ductal dilatation. No pancreatic inflammation. Spleen: Normal spleen Adrenals/urinary tract: Adrenal glands and kidneys are normal. The ureters and bladder normal. Stomach/Bowel: Stomach, small-bowel appendix and cecum normal. Moderate volume stool throughout the colon. sigmoid anastomosis without obstruction. Large volume stool in the rectum. Vascular/Lymphatic: Abdominal aorta is normal caliber. There is no retroperitoneal or periportal lymphadenopathy. No pelvic lymphadenopathy. Reproductive: Uterus and ovaries normal Other: No free fluid. Musculoskeletal: No aggressive osseous lesion. IMPRESSION: Chest Impression: 1. No evidence of thoracic metastasis. 2. Port in proper position. Abdomen / Pelvis Impression: 1. Interval resection of small inferior RIGHT hepatic lobe lesion. 2. No evidence of new hepatic metastasis. Stable hepatic cysts again demonstrated. 3. Interval partial sigmoid colon resection. 4. Large  volume stool in the rectum. Moderate volume stool throughout the colon. 5. No evidence of metastatic adenopathy or peritoneal metastasis. Electronically Signed   By: Suzy Bouchard M.D.   On: 02/27/2017 11:59    Medications: I have reviewed the patient's current medications.  Assessment/Plan: 1. Adenocarcinoma of the sigmoid colon, status post a colonoscopic biopsy 07/07/2016  Mismatch repair protein expression-normal  Sigmoid mass noted at 20 cm from the anal verge  Staging CTs of the chest, abdomen, and pelvis negative for evidence of metastatic disease other than an indeterminate 10 mm right liver lesion  MRI of the abdomen 07/07/2016 revealed multiple bilateral hepatic cyst. 7 mm inferior subcapsular right liver lesion suspicious for a metastasis  PET scan 07/21/2016-hypermetabolic sigmoid colon mass, mildly hypermetabolic segment 6 liver lesion  Laparoscopic assisted rectosigmoid colectomy and segment 8 liver resection 07/27/2016, pathology confirmed a T3, N2b, M1tumor, 11/16 lymph nodes positive for metastatic carcinoma  Foundation 1 testing found the tumor to be microsatellite stable, tumor mutation burden low, no BRAFor RAS mutation  No loss of mismatch repair protein expression  Cycle 1 CAPOX 08/22/2016  Cycle 2 CAPOX 09/12/2016  Cycle 3 CAPOX 10/03/2016 (Xeloda dose reduced to 3 tablets every morning and 2 tablets every evening beginning with cycle 3)  Cycle 4 CAPOX 10/31/2016 (Xeloda re-escalated to original dose)  Cycle 5 CAPOX 11/21/2016  Cycle 6 CAPOX 12/12/2016  Cycle 7 CAPOX 01/09/2017 (oxaliplatin held)  Cycle 8 CAPOX 01/30/2017 (oxaliplatin dose reduced)  CT scans 02/27/2017-negative for recurrent colon cancer 2.   Rectal bleeding secondary to #1  3. Multiple polyps on the colonoscopy 07/07/2016  Admission 07/13/2016 with acute GI bleeding secondary to an ulcer at the cecum  4. Family history of breast and pancreas cancer  5. Delayed  nausea following cycle 1 CAPOX-emend added with cycle 2  6. Diarrhea following cycle 2 and cycle 6 CAPOX  7. Neutropenia/thrombocytopenia secondary to chemotherapy  8.  Mild oxaliplatin neuropathy    Disposition:  Ms. Gerhart completed the planned course of adjuvant chemotherapy. She is in clinical remission from colon cancer. She will return for a Port-A-Cath flush and CBC in 6  weeks. She will be scheduled for an office visit and CEA in 12 weeks.  We will plan for a restaging CT in 6 months.  She will schedule a surveillance colonoscopy with Dr. Collene Mares for August or September.  We will ask Dr. Zella Richer to remove the Port-A-Cath if the CEA is normal when she returns in 3 months.  We discussed diet and exercise maneuvers that may decrease the risk of developing colon cancer. We also discussed the risk/benefit of aspirin therapy.  25 minutes were spent with the patient today. The majority of the time was used for counseling and coordination of care.  Patricia Coder, MD  02/27/2017  4:20 PM

## 2017-04-10 ENCOUNTER — Ambulatory Visit (HOSPITAL_BASED_OUTPATIENT_CLINIC_OR_DEPARTMENT_OTHER): Payer: No Typology Code available for payment source

## 2017-04-10 ENCOUNTER — Other Ambulatory Visit (HOSPITAL_BASED_OUTPATIENT_CLINIC_OR_DEPARTMENT_OTHER): Payer: No Typology Code available for payment source

## 2017-04-10 DIAGNOSIS — C187 Malignant neoplasm of sigmoid colon: Secondary | ICD-10-CM | POA: Diagnosis not present

## 2017-04-10 DIAGNOSIS — Z95828 Presence of other vascular implants and grafts: Secondary | ICD-10-CM

## 2017-04-10 LAB — CBC WITH DIFFERENTIAL/PLATELET
BASO%: 0.8 % (ref 0.0–2.0)
Basophils Absolute: 0 10*3/uL (ref 0.0–0.1)
EOS%: 1.6 % (ref 0.0–7.0)
Eosinophils Absolute: 0.1 10*3/uL (ref 0.0–0.5)
HCT: 41.1 % (ref 34.8–46.6)
HGB: 14 g/dL (ref 11.6–15.9)
LYMPH#: 1.6 10*3/uL (ref 0.9–3.3)
LYMPH%: 34.8 % (ref 14.0–49.7)
MCH: 32.2 pg (ref 25.1–34.0)
MCHC: 34.1 g/dL (ref 31.5–36.0)
MCV: 94.5 fL (ref 79.5–101.0)
MONO#: 0.5 10*3/uL (ref 0.1–0.9)
MONO%: 9.8 % (ref 0.0–14.0)
NEUT%: 53 % (ref 38.4–76.8)
NEUTROS ABS: 2.4 10*3/uL (ref 1.5–6.5)
PLATELETS: 117 10*3/uL — AB (ref 145–400)
RBC: 4.36 10*6/uL (ref 3.70–5.45)
RDW: 13 % (ref 11.2–14.5)
WBC: 4.6 10*3/uL (ref 3.9–10.3)

## 2017-04-10 MED ORDER — SODIUM CHLORIDE 0.9% FLUSH
10.0000 mL | INTRAVENOUS | Status: DC | PRN
  Administered 2017-04-10: 10 mL via INTRAVENOUS
  Filled 2017-04-10: qty 10

## 2017-04-10 MED ORDER — HEPARIN SOD (PORK) LOCK FLUSH 100 UNIT/ML IV SOLN
500.0000 [IU] | Freq: Once | INTRAVENOUS | Status: AC | PRN
  Administered 2017-04-10: 500 [IU] via INTRAVENOUS
  Filled 2017-04-10: qty 5

## 2017-04-10 NOTE — Patient Instructions (Signed)

## 2017-05-10 ENCOUNTER — Telehealth: Payer: Self-pay | Admitting: Oncology

## 2017-05-10 NOTE — Telephone Encounter (Signed)
sw pt to confirm time change on 7/3 appts per sch msg

## 2017-05-22 ENCOUNTER — Ambulatory Visit (HOSPITAL_BASED_OUTPATIENT_CLINIC_OR_DEPARTMENT_OTHER): Payer: No Typology Code available for payment source

## 2017-05-22 ENCOUNTER — Ambulatory Visit: Payer: No Typology Code available for payment source | Admitting: Oncology

## 2017-05-22 ENCOUNTER — Other Ambulatory Visit (HOSPITAL_BASED_OUTPATIENT_CLINIC_OR_DEPARTMENT_OTHER): Payer: No Typology Code available for payment source

## 2017-05-22 ENCOUNTER — Ambulatory Visit (HOSPITAL_BASED_OUTPATIENT_CLINIC_OR_DEPARTMENT_OTHER): Payer: No Typology Code available for payment source | Admitting: Oncology

## 2017-05-22 ENCOUNTER — Other Ambulatory Visit: Payer: No Typology Code available for payment source

## 2017-05-22 ENCOUNTER — Telehealth: Payer: Self-pay | Admitting: Oncology

## 2017-05-22 VITALS — BP 137/90 | HR 92 | Temp 98.4°F | Resp 18 | Ht 68.0 in | Wt 143.9 lb

## 2017-05-22 DIAGNOSIS — Z95828 Presence of other vascular implants and grafts: Secondary | ICD-10-CM

## 2017-05-22 DIAGNOSIS — C187 Malignant neoplasm of sigmoid colon: Secondary | ICD-10-CM

## 2017-05-22 LAB — CEA (IN HOUSE-CHCC): CEA (CHCC-IN HOUSE): 3.58 ng/mL (ref 0.00–5.00)

## 2017-05-22 MED ORDER — SODIUM CHLORIDE 0.9% FLUSH
10.0000 mL | INTRAVENOUS | Status: DC | PRN
  Administered 2017-05-22: 10 mL via INTRAVENOUS
  Filled 2017-05-22: qty 10

## 2017-05-22 MED ORDER — HEPARIN SOD (PORK) LOCK FLUSH 100 UNIT/ML IV SOLN
500.0000 [IU] | Freq: Once | INTRAVENOUS | Status: AC | PRN
  Administered 2017-05-22: 500 [IU] via INTRAVENOUS
  Filled 2017-05-22: qty 5

## 2017-05-22 NOTE — Patient Instructions (Signed)

## 2017-05-22 NOTE — Telephone Encounter (Signed)
Appointments scheduled per 05/22/17 los. Patient was given 2 bottles of Oral Contrast and a copy of the instructions, per CT order. Patient was given a copy of the AVS report and appointment schedule per 05/22/17 los.

## 2017-05-22 NOTE — Progress Notes (Signed)
  [place] OFFICE PROGRESS NOTE   Diagnosis: Colon cancer  INTERVAL HISTORY:   Patricia Hudson returns as scheduled. She feels well. The neuropathy symptoms have improved. This does not interfere with activity. No new complaint. Good appetite.  Objective:  Vital signs in last 24 hours:  Blood pressure 137/90, pulse 92, temperature 98.4 F (36.9 C), temperature source Oral, resp. rate 18, height 5' 8" (1.727 m), weight 143 lb 14.4 oz (65.3 kg), last menstrual period 04/20/2013, SpO2 98 %.    HEENT: Neck without mass Lymphatics: No cervical, supraclavicular, axillary, or inguinal nodes Resp: Lungs clear bilaterally Cardio: Regular rate and rhythm GI: No hepatomegaly, no mass, nontender Vascular: No leg edema    Portacath/PICC-without erythema  Lab Results:   Lab Results  Component Value Date   CEA1 3.58 05/22/2017     Medications: I have reviewed the patient's current medications.  Assessment/Plan: 1. Adenocarcinoma of the sigmoid colon, status post a colonoscopic biopsy 07/07/2016  Mismatch repair protein expression-normal  Sigmoid mass noted at 20 cm from the anal verge  Staging CTs of the chest, abdomen, and pelvis negative for evidence of metastatic disease other than an indeterminate 10 mm right liver lesion  MRI of the abdomen 07/07/2016 revealed multiple bilateral hepatic cyst. 7 mm inferior subcapsular right liver lesion suspicious for a metastasis  PET scan 07/21/2016-hypermetabolic sigmoid colon mass, mildly hypermetabolic segment 6 liver lesion  Laparoscopic assisted rectosigmoid colectomy and segment 8 liver resection 07/27/2016, pathology confirmed a T3, N2b, M1tumor, 11/16 lymph nodes positive for metastatic carcinoma  Foundation 1 testing found the tumor to be microsatellite stable, tumor mutation burden low, no BRAFor RAS mutation  No loss of mismatch repair protein expression  Cycle 1 CAPOX 08/22/2016  Cycle 2 CAPOX  09/12/2016  Cycle 3 CAPOX 10/03/2016 (Xeloda dose reduced to 3 tablets every morning and 2 tablets every evening beginning with cycle 3)  Cycle 4 CAPOX 10/31/2016 (Xeloda re-escalated to original dose)  Cycle 5 CAPOX 11/21/2016  Cycle 6 CAPOX 12/12/2016  Cycle 7 CAPOX 01/09/2017 (oxaliplatin held)  Cycle 8 CAPOX 01/30/2017 (oxaliplatin dose reduced)  CT scans 02/27/2017-negative for recurrent colon cancer 2.   Rectal bleeding secondary to #1  3. Multiple polyps on the colonoscopy 07/07/2016  Admission 07/13/2016 with acute GI bleeding secondary to an ulcer at the cecum  4. Family history of breast and pancreas cancer  5. Delayed nausea following cycle 1 CAPOX-emend added with cycle 2  6. Diarrhea following cycle 2 and cycle 6 CAPOX  7. Neutropenia/thrombocytopenia secondary to chemotherapy  8. Mild oxaliplatin neuropathy-improved   Disposition:  Patricia Hudson remains in clinical remission from colon cancer. She would like to keep the Port-A-Cath in place. She will return for a Port-A-Cath flush and CEA in 6 weeks. She will be scheduled for an office visit and restaging CT evaluation in 3 months.  She will schedule a one-year surveillance colonoscopy with Dr. Collene Mares.   Patricia Romberg, MD  05/22/2017  11:02 AM

## 2017-05-29 NOTE — Progress Notes (Signed)
Patient ID: Patricia Hudson, female   DOB: February 09, 1966, 51 y.o.   MRN: [id number]  51 y.o. G1P1001 MarriedCaucasianF here for annual exam.    The patient has a h/o adenocarcinoma of the sigmoid colon. In 9/17 she had laparoscopic rectosigmoid colectomy and resection of a liver mass. 11/16 + nodes. Stage 4. S/P chemo. Currently in remission. Has a f/u CT scheduled for 9/18. First colonoscopy was done secondary to some blood in her stool. She is feeling well now. Has a great attitude.  She is menopausal, no bleeding. Sexually active, not often. No pain.   Patient's last menstrual period was 04/20/2013.          Sexually active: Yes.    The current method of family planning is post menopausal status.    Exercising: Yes.    rarely Smoker:  no  Health Maintenance: Pap: 05/30/16, Negative with neg HR HPV  04/18/13, Negative with neg HR HPV History of abnormal Pap:  no MMG:  09/03/15, 3D, Bi-Rads 1:  Negative Colonoscopy: 07/14/16 colon cancer BMD: Never TDaP:  2008   reports that she has never smoked. She has never used smokeless tobacco. She reports that she drinks alcohol. She reports that she does not use drugs. She is an Forensic psychologist, works on [employer]. Has a 26 year old son, will be a sophomore at the [employer]. Husband is a Primary school teacher  Past Medical History:  Diagnosis Date  . Cancer [facility]) colon cancer   liver mass  . Headache    migraine  . Renal calculi 1994    Past Surgical History:  Procedure Laterality Date  . COLONOSCOPY    . COLONOSCOPY N/A 07/14/2016   Procedure: COLONOSCOPY;  Surgeon: Carol Ada, MD;  Location: [facility] ENDOSCOPY;  Service: Endoscopy;  Laterality: N/A;  . LAPAROSCOPIC LIVER CYST REMOVAL N/A 07/27/2016   Procedure: REMOVAL OF LIVER MASS;  Surgeon: Jackolyn Confer, MD;  Location: [facility] ORS;  Service: General;  Laterality: N/A;  . LAPAROSCOPIC PARTIAL COLECTOMY N/A 07/27/2016   Procedure: LAPAROSCOPIC PARTIAL COLECTOMY MOBILIZATION OF SPLENIC FLEXURE  LAPAROSCOPIC SEGMENTAL LIVER RESECTION OF SEGMENT 8 OF LIVER;  Surgeon: Jackolyn Confer, MD;  Location: [facility] ORS;  Service: General;  Laterality: N/A;  . PORTACATH PLACEMENT Right 08/17/2016   Procedure: ULTRASOUND GUIDED INSERTION PORT-A-CATH RIGHT INTERNAL JUGULAR;  Surgeon: Jackolyn Confer, MD;  Location: [facility];  Service: General;  Laterality: Right;  . TONSILLECTOMY AND ADENOIDECTOMY  1970's    Current Outpatient Prescriptions  Medication Sig Dispense Refill  . acetaminophen (TYLENOL) 325 MG tablet Take 650 mg by mouth daily as needed for mild pain or headache.    . lidocaine-prilocaine (EMLA) cream Apply 1 application topically as needed. Apply to port site 1 hour prior to stick and cover with plastic wrap 30 g 11   No current facility-administered medications for this visit.     Family History  Problem Relation Age of Onset  . Diabetes Father   . Hypertension Father   . Lung cancer Father 2       smoker  . Colon polyps Father        unspecified number  . Breast cancer Mother 81       triple negative.BRCA Neg  . Colon polyps Mother        unspecified number  . Colon polyps Brother        unspecified number  . Breast cancer Maternal Grandmother        dx late 67s; s/p mastectomy;  d. 66y  . Breast cancer Paternal Aunt 51       carcinoma in situ  . Pancreatic cancer Maternal Uncle 26       d. 65y  . Leukemia Other        maternal great uncle (MGM's brother)  . Other Maternal Grandfather        hx of "half of stomach removed" - not aware of cancer  . Parkinson's disease Paternal Grandfather   . Heart attack Paternal Grandfather        d. 69y  . Cancer Other        paternal great aunt (PGM's sister) d. 86s, abdominal/Gyn cancer; lim info    Review of Systems  Exam:   BP 100/70 (BP Location: Right Arm, Patient Position: Sitting, Cuff Size: Normal)   Pulse 90   Resp 16   Ht 5' 5.75" (1.67 m)   Wt 144 lb (65.3 kg)   LMP 04/20/2013   BMI 23.42 kg/m   Weight change:  '@WEIGHTCHANGE' @ Height:   Height: 5' 5.75" (167 cm)  Ht Readings from Last 3 Encounters:  06/01/17 5' 5.75" (1.67 m)  05/22/17 '5\' 8"'  (1.727 m)  02/27/17 '5\' 8"'  (1.727 m)    General appearance: alert, cooperative and appears stated age Head: Normocephalic, without obvious abnormality, atraumatic Neck: no adenopathy, supple, symmetrical, trachea midline and thyroid normal to inspection and palpation Lungs: clear to auscultation bilaterally Cardiovascular: regular rate and rhythm Breasts: normal appearance, no masses or tenderness Abdomen: soft, non-tender; bowel sounds normal; no masses,  no organomegaly Extremities: extremities normal, atraumatic, no cyanosis or edema Skin: Skin color, texture, turgor normal. No rashes or lesions Lymph nodes: Cervical, supraclavicular, and axillary nodes normal. No abnormal inguinal nodes palpated Neurologic: Grossly normal   Pelvic: External genitalia:  no lesions              Urethra:  normal appearing urethra with no masses, tenderness or lesions              Bartholins and Skenes: normal                 Vagina: normal appearing vagina with normal color and discharge, no lesions              Cervix: no lesions               Bimanual Exam:  Uterus:  normal size, contour, position, consistency, mobility, non-tender and anteverted              Adnexa: no mass, fullness, tenderness               Rectovaginal: Confirms               Anus:  normal sphincter tone, no lesions  Chaperone was present for exam.  A:  Well Woman with normal exam  H/O stage 4 colon cancer, now in remission, feeling well  Due for TDAP  P:   No pap this year  Discussed calcium/vit d  Mammogram due, recommend she do the 3D  Labs are UTD  TDAP

## 2017-06-01 ENCOUNTER — Encounter: Payer: Self-pay | Admitting: Obstetrics and Gynecology

## 2017-06-01 ENCOUNTER — Ambulatory Visit (INDEPENDENT_AMBULATORY_CARE_PROVIDER_SITE_OTHER): Payer: PRIVATE HEALTH INSURANCE | Admitting: Obstetrics and Gynecology

## 2017-06-01 ENCOUNTER — Ambulatory Visit: Payer: PRIVATE HEALTH INSURANCE | Admitting: Nurse Practitioner

## 2017-06-01 VITALS — BP 100/70 | HR 90 | Resp 16 | Ht 65.75 in | Wt 144.0 lb

## 2017-06-01 DIAGNOSIS — Z01419 Encounter for gynecological examination (general) (routine) without abnormal findings: Secondary | ICD-10-CM

## 2017-06-01 DIAGNOSIS — Z23 Encounter for immunization: Secondary | ICD-10-CM

## 2017-06-01 DIAGNOSIS — Z85038 Personal history of other malignant neoplasm of large intestine: Secondary | ICD-10-CM

## 2017-06-01 NOTE — Patient Instructions (Addendum)
EXERCISE AND DIET:  We recommended that you start or continue a regular exercise program for good health. Regular exercise means any activity that makes your heart beat faster and makes you sweat.  We recommend exercising at least 30 minutes per day at least 3 days a week, preferably 4 or 5.  We also recommend a diet low in fat and sugar.  Inactivity, poor dietary choices and obesity can cause diabetes, heart attack, stroke, and kidney damage, among others.    ALCOHOL AND SMOKING:  Women should limit their alcohol intake to no more than 7 drinks/beers/glasses of wine (combined, not each!) per week. Moderation of alcohol intake to this level decreases your risk of breast cancer and liver damage. And of course, no recreational drugs are part of a healthy lifestyle.  And absolutely no smoking or even second hand smoke. Most people know smoking can cause heart and lung diseases, but did you know it also contributes to weakening of your bones? Aging of your skin?  Yellowing of your teeth and nails?  CALCIUM AND VITAMIN D:  Adequate intake of calcium and Vitamin D are recommended.  The recommendations for exact amounts of these supplements seem to change often, but generally speaking 600 mg of calcium (either carbonate or citrate) and 800 units of Vitamin D per day seems prudent. Certain women may benefit from higher intake of Vitamin D.  If you are among these women, your doctor will have told you during your visit.    PAP SMEARS:  Pap smears, to check for cervical cancer or precancers,  have traditionally been done yearly, although recent scientific advances have shown that most women can have pap smears less often.  However, every woman still should have a physical exam from her gynecologist every year. It will include a breast check, inspection of the vulva and vagina to check for abnormal growths or skin changes, a visual exam of the cervix, and then an exam to evaluate the size and shape of the uterus and  ovaries.  And after 49 years of age, a rectal exam is indicated to check for rectal cancers. We will also provide age appropriate advice regarding health maintenance, like when you should have certain vaccines, screening for sexually transmitted diseases, bone density testing, colonoscopy, mammograms, etc.   MAMMOGRAMS:  All women over 40 years old should have a yearly mammogram. Many facilities now offer a "3D" mammogram, which may cost around $50 extra out of pocket. If possible,  we recommend you accept the option to have the 3D mammogram performed.  It both reduces the number of women who will be called back for extra views which then turn out to be normal, and it is better than the routine mammogram at detecting truly abnormal areas.    COLONOSCOPY:  Colonoscopy to screen for colon cancer is recommended for all women at age 50.  We know, you hate the idea of the prep.  We agree, BUT, having colon cancer and not knowing it is worse!!  Colon cancer so often starts as a polyp that can be seen and removed at colonscopy, which can quite literally save your life!  And if your first colonoscopy is normal and you have no family history of colon cancer, most women don't have to have it again for 10 years.  Once every ten years, you can do something that may end up saving your life, right?  We will be happy to help you get it scheduled when you are ready.    Be sure to check your insurance coverage so you understand how much it will cost.  It may be covered as a preventative service at no cost, but you should check your particular policy.      Breast Self-Awareness Breast self-awareness means being familiar with how your breasts look and feel. It involves checking your breasts regularly and reporting any changes to your health care provider. Practicing breast self-awareness is important. A change in your breasts can be a sign of a serious medical problem. Being familiar with how your breasts look and feel allows  you to find any problems early, when treatment is more likely to be successful. All women should practice breast self-awareness, including women who have had breast implants. How to do a breast self-exam One way to learn what is normal for your breasts and whether your breasts are changing is to do a breast self-exam. To do a breast self-exam: Look for Changes  1. Remove all the clothing above your waist. 2. Stand in front of a mirror in a room with good lighting. 3. Put your hands on your hips. 4. Push your hands firmly downward. 5. Compare your breasts in the mirror. Look for differences between them (asymmetry), such as: ? Differences in shape. ? Differences in size. ? Puckers, dips, and bumps in one breast and not the other. 6. Look at each breast for changes in your skin, such as: ? Redness. ? Scaly areas. 7. Look for changes in your nipples, such as: ? Discharge. ? Bleeding. ? Dimpling. ? Redness. ? A change in position. Feel for Changes  Carefully feel your breasts for lumps and changes. It is best to do this while lying on your back on the floor and again while sitting or standing in the shower or tub with soapy water on your skin. Feel each breast in the following way:  Place the arm on the side of the breast you are examining above your head.  Feel your breast with the other hand.  Start in the nipple area and make  inch (2 cm) overlapping circles to feel your breast. Use the pads of your three middle fingers to do this. Apply light pressure, then medium pressure, then firm pressure. The light pressure will allow you to feel the tissue closest to the skin. The medium pressure will allow you to feel the tissue that is a little deeper. The firm pressure will allow you to feel the tissue close to the ribs.  Continue the overlapping circles, moving downward over the breast until you feel your ribs below your breast.  Move one finger-width toward the center of the body.  Continue to use the  inch (2 cm) overlapping circles to feel your breast as you move slowly up toward your collarbone.  Continue the up and down exam using all three pressures until you reach your armpit.  Write Down What You Find  Write down what is normal for each breast and any changes that you find. Keep a written record with breast changes or normal findings for each breast. By writing this information down, you do not need to depend only on memory for size, tenderness, or location. Write down where you are in your menstrual cycle, if you are still menstruating. If you are having trouble noticing differences in your breasts, do not get discouraged. With time you will become more familiar with the variations in your breasts and more comfortable with the exam. How often should I examine my breasts? Examine   your breasts every month. If you are breastfeeding, the best time to examine your breasts is after a feeding or after using a breast pump. If you menstruate, the best time to examine your breasts is 5-7 days after your period is over. During your period, your breasts are lumpier, and it may be more difficult to notice changes. When should I see my health care provider? See your health care provider if you notice:  A change in shape or size of your breasts or nipples.  A change in the skin of your breast or nipples, such as a reddened or scaly area.  Unusual discharge from your nipples.  A lump or thick area that was not there before.  Pain in your breasts.  Anything that concerns you.  This information is not intended to replace advice given to you by your health care provider. Make sure you discuss any questions you have with your health care provider. Document Released: 11/06/2005 Document Revised: 04/13/2016 Document Reviewed: 09/26/2015 Elsevier Interactive Patient Education  2018 [employer]  

## 2017-06-25 ENCOUNTER — Other Ambulatory Visit: Payer: Self-pay | Admitting: Orthopedic Surgery

## 2017-06-25 ENCOUNTER — Encounter (HOSPITAL_BASED_OUTPATIENT_CLINIC_OR_DEPARTMENT_OTHER): Payer: Self-pay | Admitting: *Deleted

## 2017-06-28 ENCOUNTER — Ambulatory Visit (HOSPITAL_BASED_OUTPATIENT_CLINIC_OR_DEPARTMENT_OTHER)
Admission: RE | Admit: 2017-06-28 | Discharge: 2017-06-28 | Disposition: A | Discharge status: Home / Self Care | Payer: No Typology Code available for payment source | Source: Ambulatory Visit | Attending: Orthopedic Surgery | Admitting: Orthopedic Surgery

## 2017-06-28 ENCOUNTER — Encounter (HOSPITAL_BASED_OUTPATIENT_CLINIC_OR_DEPARTMENT_OTHER): Payer: Self-pay | Admitting: Anesthesiology

## 2017-06-28 ENCOUNTER — Ambulatory Visit (HOSPITAL_BASED_OUTPATIENT_CLINIC_OR_DEPARTMENT_OTHER): Payer: No Typology Code available for payment source | Admitting: Anesthesiology

## 2017-06-28 ENCOUNTER — Encounter (HOSPITAL_BASED_OUTPATIENT_CLINIC_OR_DEPARTMENT_OTHER)
Admission: RE | Disposition: A | Discharge status: Home / Self Care | Payer: Self-pay | Source: Ambulatory Visit | Attending: Orthopedic Surgery

## 2017-06-28 DIAGNOSIS — Z85038 Personal history of other malignant neoplasm of large intestine: Secondary | ICD-10-CM | POA: Insufficient documentation

## 2017-06-28 DIAGNOSIS — Y929 Unspecified place or not applicable: Secondary | ICD-10-CM | POA: Insufficient documentation

## 2017-06-28 DIAGNOSIS — Z87442 Personal history of urinary calculi: Secondary | ICD-10-CM | POA: Insufficient documentation

## 2017-06-28 DIAGNOSIS — S92331A Displaced fracture of third metatarsal bone, right foot, initial encounter for closed fracture: Secondary | ICD-10-CM | POA: Insufficient documentation

## 2017-06-28 DIAGNOSIS — S92351A Displaced fracture of fifth metatarsal bone, right foot, initial encounter for closed fracture: Secondary | ICD-10-CM | POA: Diagnosis not present

## 2017-06-28 DIAGNOSIS — S92343A Displaced fracture of fourth metatarsal bone, unspecified foot, initial encounter for closed fracture: Secondary | ICD-10-CM

## 2017-06-28 DIAGNOSIS — S92333A Displaced fracture of third metatarsal bone, unspecified foot, initial encounter for closed fracture: Secondary | ICD-10-CM

## 2017-06-28 DIAGNOSIS — S92341A Displaced fracture of fourth metatarsal bone, right foot, initial encounter for closed fracture: Secondary | ICD-10-CM | POA: Diagnosis not present

## 2017-06-28 DIAGNOSIS — X501XXA Overexertion from prolonged static or awkward postures, initial encounter: Secondary | ICD-10-CM | POA: Diagnosis not present

## 2017-06-28 HISTORY — DX: Displaced fracture of fourth metatarsal bone, unspecified foot, initial encounter for closed fracture: S92.343A

## 2017-06-28 HISTORY — DX: Displaced fracture of fifth metatarsal bone, right foot, initial encounter for closed fracture: S92.351A

## 2017-06-28 HISTORY — PX: PERCUTANEOUS PINNING: SHX2209

## 2017-06-28 HISTORY — DX: Displaced fracture of third metatarsal bone, unspecified foot, initial encounter for closed fracture: S92.333A

## 2017-06-28 SURGERY — PINNING, EXTREMITY, PERCUTANEOUS
Anesthesia: General | Site: Foot | Laterality: Right

## 2017-06-28 MED ORDER — PROMETHAZINE HCL 25 MG/ML IJ SOLN: 6.2500 mg | INTRAMUSCULAR | Status: DC | PRN

## 2017-06-28 MED ORDER — MIDAZOLAM HCL 2 MG/2ML IJ SOLN
INTRAMUSCULAR | Status: AC
  Filled 2017-06-28: qty 2

## 2017-06-28 MED ORDER — LACTATED RINGERS IV SOLN
INTRAVENOUS | Status: DC
  Administered 2017-06-28 (×2): via INTRAVENOUS

## 2017-06-28 MED ORDER — PROPOFOL 10 MG/ML IV BOLUS
INTRAVENOUS | Status: AC
  Filled 2017-06-28: qty 20

## 2017-06-28 MED ORDER — ROPIVACAINE HCL 5 MG/ML IJ SOLN
INTRAMUSCULAR | Status: DC | PRN
  Administered 2017-06-28: 30 mL via PERINEURAL

## 2017-06-28 MED ORDER — ONDANSETRON HCL 4 MG/2ML IJ SOLN
INTRAMUSCULAR | Status: AC
  Filled 2017-06-28: qty 2

## 2017-06-28 MED ORDER — HYDROCODONE-ACETAMINOPHEN 5-325 MG PO TABS: 1.0000 | ORAL_TABLET | Freq: Four times a day (QID) | ORAL | 0 refills | Status: DC | PRN

## 2017-06-28 MED ORDER — MEPERIDINE HCL 25 MG/ML IJ SOLN: 6.2500 mg | INTRAMUSCULAR | Status: DC | PRN

## 2017-06-28 MED ORDER — CEFAZOLIN SODIUM-DEXTROSE 2-4 GM/100ML-% IV SOLN
2.0000 g | INTRAVENOUS | Status: AC
  Administered 2017-06-28: 2 g via INTRAVENOUS

## 2017-06-28 MED ORDER — FENTANYL CITRATE (PF) 100 MCG/2ML IJ SOLN
50.0000 ug | INTRAMUSCULAR | Status: DC | PRN
  Administered 2017-06-28: 100 ug via INTRAVENOUS

## 2017-06-28 MED ORDER — DEXAMETHASONE SODIUM PHOSPHATE 10 MG/ML IJ SOLN
INTRAMUSCULAR | Status: AC
  Filled 2017-06-28: qty 1

## 2017-06-28 MED ORDER — ONDANSETRON HCL 4 MG/2ML IJ SOLN
INTRAMUSCULAR | Status: DC | PRN
  Administered 2017-06-28: 4 mg via INTRAVENOUS

## 2017-06-28 MED ORDER — HYDROMORPHONE HCL 1 MG/ML IJ SOLN: 0.2500 mg | INTRAMUSCULAR | Status: DC | PRN

## 2017-06-28 MED ORDER — PROPOFOL 10 MG/ML IV BOLUS
INTRAVENOUS | Status: DC | PRN
  Administered 2017-06-28: 200 mg via INTRAVENOUS

## 2017-06-28 MED ORDER — OXYCODONE HCL 5 MG PO TABS: 5.0000 mg | ORAL_TABLET | Freq: Once | ORAL | Status: DC | PRN

## 2017-06-28 MED ORDER — SCOPOLAMINE 1 MG/3DAYS TD PT72: 1.0000 | MEDICATED_PATCH | Freq: Once | TRANSDERMAL | Status: DC | PRN

## 2017-06-28 MED ORDER — DEXAMETHASONE SODIUM PHOSPHATE 10 MG/ML IJ SOLN
INTRAMUSCULAR | Status: DC | PRN
  Administered 2017-06-28: 10 mg via INTRAVENOUS

## 2017-06-28 MED ORDER — OXYCODONE HCL 5 MG/5ML PO SOLN: 5.0000 mg | Freq: Once | ORAL | Status: DC | PRN

## 2017-06-28 MED ORDER — LIDOCAINE HCL (CARDIAC) 20 MG/ML IV SOLN
INTRAVENOUS | Status: DC | PRN
  Administered 2017-06-28: 30 mg via INTRAVENOUS

## 2017-06-28 MED ORDER — LIDOCAINE 2% (20 MG/ML) 5 ML SYRINGE
INTRAMUSCULAR | Status: AC
  Filled 2017-06-28: qty 5

## 2017-06-28 MED ORDER — SENNA-DOCUSATE SODIUM 8.6-50 MG PO TABS: 2.0000 | ORAL_TABLET | Freq: Every day | ORAL | 1 refills | Status: DC

## 2017-06-28 MED ORDER — ONDANSETRON HCL 4 MG PO TABS: 4.0000 mg | ORAL_TABLET | Freq: Three times a day (TID) | ORAL | 0 refills | Status: DC | PRN

## 2017-06-28 MED ORDER — FENTANYL CITRATE (PF) 100 MCG/2ML IJ SOLN
INTRAMUSCULAR | Status: AC
  Filled 2017-06-28: qty 2

## 2017-06-28 MED ORDER — MIDAZOLAM HCL 2 MG/2ML IJ SOLN
1.0000 mg | INTRAMUSCULAR | Status: DC | PRN
  Administered 2017-06-28: 2 mg via INTRAVENOUS

## 2017-06-28 SURGICAL SUPPLY — 55 items
BANDAGE ACE 4X5 VEL STRL LF (GAUZE/BANDAGES/DRESSINGS) ×2 IMPLANT
BANDAGE ACE 6X5 VEL STRL LF (GAUZE/BANDAGES/DRESSINGS) IMPLANT
BANDAGE ESMARK 6X9 LF (GAUZE/BANDAGES/DRESSINGS) ×1 IMPLANT
BLADE SURG 15 STRL LF DISP TIS (BLADE) IMPLANT
BLADE SURG 15 STRL SS (BLADE)
BNDG ESMARK 6X9 LF (GAUZE/BANDAGES/DRESSINGS) ×2
CANISTER SUCT 1200ML W/VALVE (MISCELLANEOUS) ×2 IMPLANT
CLSR STERI-STRIP ANTIMIC 1/2X4 (GAUZE/BANDAGES/DRESSINGS) IMPLANT
CUFF TOURNIQUET SINGLE 24IN (TOURNIQUET CUFF) IMPLANT
CUFF TOURNIQUET SINGLE 34IN LL (TOURNIQUET CUFF) ×2 IMPLANT
DECANTER SPIKE VIAL GLASS SM (MISCELLANEOUS) IMPLANT
DRAPE EXTREMITY T 121X128X90 (DRAPE) ×2 IMPLANT
DRAPE OEC MINIVIEW 54X84 (DRAPES) ×2 IMPLANT
DRAPE U-SHAPE 47X51 STRL (DRAPES) ×2 IMPLANT
DURAPREP 26ML APPLICATOR (WOUND CARE) ×4 IMPLANT
ELECT REM PT RETURN 9FT ADLT (ELECTROSURGICAL) ×2
ELECTRODE REM PT RTRN 9FT ADLT (ELECTROSURGICAL) ×1 IMPLANT
GAUZE SPONGE 4X4 12PLY STRL (GAUZE/BANDAGES/DRESSINGS) ×2 IMPLANT
GAUZE XEROFORM 1X8 LF (GAUZE/BANDAGES/DRESSINGS) ×2 IMPLANT
GLOVE BIOGEL PI IND STRL 7.0 (GLOVE) ×2 IMPLANT
GLOVE BIOGEL PI IND STRL 8 (GLOVE) ×3 IMPLANT
GLOVE BIOGEL PI INDICATOR 7.0 (GLOVE) ×2
GLOVE BIOGEL PI INDICATOR 8 (GLOVE) ×3
GLOVE ECLIPSE 6.5 STRL STRAW (GLOVE) ×4 IMPLANT
GLOVE ORTHO TXT STRL SZ7.5 (GLOVE) ×2 IMPLANT
GLOVE SURG ORTHO 8.0 STRL STRW (GLOVE) ×2 IMPLANT
GOWN STRL REUS W/ TWL LRG LVL3 (GOWN DISPOSABLE) ×1 IMPLANT
GOWN STRL REUS W/ TWL XL LVL3 (GOWN DISPOSABLE) ×2 IMPLANT
GOWN STRL REUS W/TWL LRG LVL3 (GOWN DISPOSABLE) ×1
GOWN STRL REUS W/TWL XL LVL3 (GOWN DISPOSABLE) ×2
NS IRRIG 1000ML POUR BTL (IV SOLUTION) IMPLANT
PACK ARTHROSCOPY DSU (CUSTOM PROCEDURE TRAY) ×2 IMPLANT
PACK BASIN DAY SURGERY FS (CUSTOM PROCEDURE TRAY) ×2 IMPLANT
PAD CAST 4YDX4 CTTN HI CHSV (CAST SUPPLIES) ×1 IMPLANT
PADDING CAST ABS 4INX4YD NS (CAST SUPPLIES) ×1
PADDING CAST ABS COTTON 4X4 ST (CAST SUPPLIES) ×1 IMPLANT
PADDING CAST COTTON 4X4 STRL (CAST SUPPLIES) ×1
PENCIL BUTTON HOLSTER BLD 10FT (ELECTRODE) IMPLANT
SLEEVE SCD COMPRESS KNEE MED (MISCELLANEOUS) ×2 IMPLANT
SPONGE LAP 4X18 X RAY DECT (DISPOSABLE) ×2 IMPLANT
SUCTION FRAZIER HANDLE 10FR (MISCELLANEOUS)
SUCTION TUBE FRAZIER 10FR DISP (MISCELLANEOUS) IMPLANT
SUT ETHILON 3 0 PS 1 (SUTURE) IMPLANT
SUT ETHILON 4 0 PS 2 18 (SUTURE) IMPLANT
SUT MNCRL AB 4-0 PS2 18 (SUTURE) IMPLANT
SUT VIC AB 0 CT1 27 (SUTURE)
SUT VIC AB 0 CT1 27XBRD ANBCTR (SUTURE) IMPLANT
SUT VIC AB 2-0 SH 27 (SUTURE)
SUT VIC AB 2-0 SH 27XBRD (SUTURE) IMPLANT
SUT VIC AB 3-0 SH 27 (SUTURE)
SUT VIC AB 3-0 SH 27X BRD (SUTURE) IMPLANT
SUT VICRYL 3-0 CR8 SH (SUTURE) IMPLANT
SYR BULB 3OZ (MISCELLANEOUS) IMPLANT
UNDERPAD 30X30 (UNDERPADS AND DIAPERS) IMPLANT
YANKAUER SUCT BULB TIP NO VENT (SUCTIONS) IMPLANT

## 2017-06-28 NOTE — Anesthesia Procedure Notes (Signed)
Procedure Name: LMA Insertion Date/Time: 06/28/2017 2:53 PM Performed by: Jalesa Thien D Pre-anesthesia Checklist: Patient identified, Emergency Drugs available, Suction available and Patient being monitored Patient Re-evaluated:Patient Re-evaluated prior to induction Oxygen Delivery Method: Circle system utilized Preoxygenation: Pre-oxygenation with 100% oxygen Induction Type: IV induction Ventilation: Mask ventilation without difficulty LMA: LMA inserted LMA Size: 3.0 Number of attempts: 1 Airway Equipment and Method: Bite block Placement Confirmation: positive ETCO2 Tube secured with: Tape Dental Injury: Teeth and Oropharynx as per pre-operative assessment

## 2017-06-28 NOTE — Op Note (Signed)
06/28/2017  3:51 PM  PATIENT:  Patricia Hudson    PRE-OPERATIVE DIAGNOSIS: Right third fourth and fifth metatarsal neck fractures, acute, displaced, traumatic  POST-OPERATIVE DIAGNOSIS:  Same  PROCEDURE:  Closed reduction with percutaneous pin fixation of the fourth and fifth metatarsal neck fractures, with closed management of third metatarsal neck fracture  SURGEON:  Johnny Bridge, MD  PHYSICIAN ASSISTANT: Joya Gaskins, OPA-C, present and scrubbed throughout the case, critical for completion in a timely fashion, and for retraction, instrumentation, and closure.  ANESTHESIA:   General with regional block  PREOPERATIVE INDICATIONS:  [name] is a  51 y.o. female [place] and had a right third fourth and fifth metatarsal neck fractures with displacement and elected for surgical management.  The risks benefits and alternatives were discussed with the patient preoperatively including but not limited to the risks of infection, bleeding, nerve injury, cardiopulmonary complications, the need for revision surgery, among others, and the patient was willing to proceed. We also discussed the risks for malunion, nonunion, pin and hardware breakage, the need for hardware removal, among others.  ESTIMATED BLOOD LOSS: Minimal  OPERATIVE IMPLANTS: 0.054 inch K wires 2, one for the fifth metatarsal and one for the fourth metatarsal.  OPERATIVE FINDINGS: Displaced fifth metatarsal neck fracture, fourth metatarsal was minimally displaced after correction of the fifth. The third metatarsal did not have much displacement and appeared stable after fixation of the fourth and fifth.  OPERATIVE PROCEDURE: The patient is brought to the operating room and placed in the supine position. Gen. anesthesia was administered. Regional block targeting given. The right lower extremity was prepped and draped in usual sterile fashion. Time out performed. The fifth metatarsal head was reduced to the neck,  and a guidepin introduced with the toe extended. After introduction of the pain into the head, I confirmed that I did not in fact engage the proximal phalanx, this was free, and I advanced the guidewire across the fracture site reducing the fracture into nearly anatomic alignment. Confirmation was made on AP and lateral views. I also checked the oblique view.  I then assessed the fourth and third metatarsal fractures, the fourth corrected reasonably well but I did feel that additional stabilization would help augment the fracture and minimize the risk for loss of reduction in the postoperative period, so I introduced a second guidepin into the fourth metatarsal head, using the same technique as above, although with this guidepin I was a little bit more palmar at the start point, and I went past the fracture site and then gait engaged out the dorsum of the cortex. It did not go down the shaft as far as the fifth, but did have bicortical fixation, and was left in position. Final C-arm pictures were taken. The pins were bent and cut, and sterile gauze applied. She has a Darco shoe.  She was awakened and returned to the PACU in stable and satisfactory condition. There were no complications and she tolerated the procedure well.

## 2017-06-28 NOTE — Anesthesia Preprocedure Evaluation (Signed)
Anesthesia Evaluation  Patient identified by MRN, date of birth, ID band Patient awake    Reviewed: Allergy & Precautions, NPO status , Patient's Chart, lab work & pertinent test results  Airway Mallampati: I  TM Distance: >3 FB Neck ROM: Full    Dental  (+) Teeth Intact, Dental Advisory Given   Pulmonary neg pulmonary ROS,    Pulmonary exam normal breath sounds clear to auscultation       Cardiovascular Exercise Tolerance: Good negative cardio ROS Normal cardiovascular exam Rhythm:Regular Rate:Normal     Neuro/Psych  Headaches, negative psych ROS   GI/Hepatic Neg liver ROS, Liver mass Colon cancer colectomy and resection of an isolated liver metastasis on 07/27/2016   Endo/Other  negative endocrine ROS  Renal/GU negative Renal ROS     Musculoskeletal negative musculoskeletal ROS (+)   Abdominal   Peds  Hematology negative hematology ROS (+)   Anesthesia Other Findings Day of surgery medications reviewed with the patient.  Reproductive/Obstetrics negative OB ROS                             Anesthesia Physical  Anesthesia Plan  ASA: II  Anesthesia Plan: General   Post-op Pain Management: GA combined w/ Regional for post-op pain   Induction: Intravenous  PONV Risk Score and Plan: 2 and Ondansetron and Dexamethasone  Airway Management Planned: LMA  Additional Equipment:   Intra-op Plan:   Post-operative Plan: Extubation in OR  Informed Consent: I have reviewed the patients History and Physical, chart, labs and discussed the procedure including the risks, benefits and alternatives for the proposed anesthesia with the patient or authorized representative who has indicated his/her understanding and acceptance.   Dental advisory given  Plan Discussed with: CRNA  Anesthesia Plan Comments: (Risks/benefits of general anesthesia discussed with patient including risk of damage to  teeth, lips, gum, and tongue, nausea/vomiting, allergic reactions to medications, and the possibility of heart attack, stroke and death.  All patient questions answered.  Patient wishes to proceed.)        Anesthesia Quick Evaluation

## 2017-06-28 NOTE — Progress Notes (Signed)
Assisted Dr. Sabra Heck with right, ultrasound guided, popliteal block. Side rails up, monitors on throughout procedure. See vital signs in flow sheet. Tolerated Procedure well.

## 2017-06-28 NOTE — H&P (Signed)
PREOPERATIVE H&P  Chief Complaint: Right foot pain  HPI: Patricia Hudson is a 51 y.o. female who twisted her right foot had acute onset severe pain, x-rays demonstrated displaced fifth metatarsal fracture, with associated fourth and third metatarsal fractures as well. She has noted soft tissue swelling, pain to palpation, difficulty ambulation, and has elected for surgical management. Elevation improves the pain, and the boot also helps, pressure makes it worse. This occurred about 4 or 5 days ago.  Past Medical History:  Diagnosis Date  . Cancer [facility]) colon cancer   liver mass  . Headache    migraine  . Renal calculi 1994   Past Surgical History:  Procedure Laterality Date  . COLONOSCOPY    . COLONOSCOPY N/A 07/14/2016   Procedure: COLONOSCOPY;  Surgeon: Carol Ada, MD;  Location: [facility];  Service: Endoscopy;  Laterality: N/A;  . LAPAROSCOPIC LIVER CYST REMOVAL N/A 07/27/2016   Procedure: REMOVAL OF LIVER MASS;  Surgeon: Jackolyn Confer, MD;  Location: [facility] ORS;  Service: General;  Laterality: N/A;  . LAPAROSCOPIC PARTIAL COLECTOMY N/A 07/27/2016   Procedure: LAPAROSCOPIC PARTIAL COLECTOMY MOBILIZATION OF SPLENIC FLEXURE LAPAROSCOPIC SEGMENTAL LIVER RESECTION OF SEGMENT 8 OF LIVER;  Surgeon: Jackolyn Confer, MD;  Location: [facility] ORS;  Service: General;  Laterality: N/A;  . PORTACATH PLACEMENT Right 08/17/2016   Procedure: ULTRASOUND GUIDED INSERTION PORT-A-CATH RIGHT INTERNAL JUGULAR;  Surgeon: Jackolyn Confer, MD;  Location: [place];  Service: General;  Laterality: Right;  . TONSILLECTOMY AND ADENOIDECTOMY  1970's   Social History   Social History  . Marital status: Married    Spouse name: N/A  . Number of children: 1  . Years of education: N/A   Occupational History  . attorney    Social History Main Topics  . Smoking status: Never Smoker  . Smokeless tobacco: Never Used  . Alcohol use Yes     Comment: 2-3 drinks per year (10/2016)  . Drug use: No  . Sexual activity: Yes   Partners: Male    Birth control/ protection: Post-menopausal   Other Topics Concern  . 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  Husband is a family MD   One child   Family History  Problem Relation Age of Onset  . Diabetes Father   . Hypertension Father   . Lung cancer Father 58       smoker  . Colon polyps Father        unspecified number  . Breast cancer Mother 75       triple negative.BRCA Neg  . Colon polyps Mother        unspecified number  . Colon polyps Brother        unspecified number  . Breast cancer Maternal Grandmother        dx late 78s; s/p mastectomy; d. 66y  . Breast cancer Paternal Aunt 22       carcinoma in situ  . Pancreatic cancer Maternal Uncle 1       d. 65y  . Leukemia Other        maternal great uncle (MGM's brother)  . Other Maternal Grandfather        hx of "half of stomach removed" - not aware of cancer  . Parkinson's disease Paternal Grandfather   . Heart attack Paternal Grandfather        d. 36y  . Cancer Other  paternal great aunt (PGM's sister) d. 60s, abdominal/Gyn cancer; lim info   Allergies  Allergen Reactions  . Sulfa Antibiotics Other (See Comments)    Stevens Johnson Syndrome   Prior to Admission medications   Medication Sig Start Date End Date Taking? Authorizing Provider  acetaminophen (TYLENOL) 325 MG tablet Take 650 mg by mouth daily as needed for mild pain or headache.   Yes [provider]     Positive ROS: All other systems have been reviewed and were otherwise negative with the exception of those mentioned in the HPI and as above.  Physical Exam: General: Alert, no acute distress Cardiovascular: No pedal edema Respiratory: No cyanosis, no use of accessory musculature GI: No organomegaly, abdomen is soft and non-tender Skin: No lesions in the area of chief complaint Neurologic: Sensation intact distally Psychiatric: Patient is  competent for consent with normal mood and affect Lymphatic: No axillary or cervical lymphadenopathy  MUSCULOSKELETAL: Right foot has positive soft tissue swelling with ecchymosis and pain to palpation with mild deformity at the lateral metatarsals.  Assessment: Right third fourth and fifth metatarsal neck fractures   Plan: Plan for Procedure(s): OPEN REDUCTION INTERNAL FIXATION (ORIF) RIGHT FOOT 3RD, 4TH, 5TH METATARSAL FRACTURE  The risks benefits and alternatives were discussed with the patient including but not limited to the risks of nonoperative treatment, versus surgical intervention including infection, bleeding, nerve injury, malunion, nonunion, the need for revision surgery, hardware prominence, hardware failure, the need for hardware removal, blood clots, cardiopulmonary complications, morbidity, mortality, among others, and they were willing to proceed.     LANDAU,JOSHUA P, MD Cell [phone]   06/28/2017 12:44 PM 

## 2017-06-28 NOTE — Transfer of Care (Signed)
Immediate Anesthesia Transfer of Care Note  Patient: Patricia Hudson  Procedure(s) Performed: Procedure(s): RIGHT FOOT 3RD, 4TH, 5TH METATARSAL (TOE) FRACTURE (Right)  Patient Location: PACU  Anesthesia Type:GA combined with regional for post-op pain  Level of Consciousness: awake and patient cooperative  Airway & Oxygen Therapy: Patient Spontanous Breathing and Patient connected to face mask oxygen  Post-op Assessment: Report given to RN and Post -op Vital signs reviewed and stable  Post vital signs: Reviewed and stable  Last Vitals:  Vitals:   06/28/17 1205 06/28/17 1210  BP: 127/78 122/69  Pulse: 85 76  Resp: 11 16  Temp:    SpO2: 99% 100%    Last Pain:  Vitals:   06/28/17 1134  TempSrc: Oral         Complications: No apparent anesthesia complications

## 2017-06-28 NOTE — Anesthesia Postprocedure Evaluation (Signed)
Anesthesia Post Note  Patient: Patricia Hudson  Procedure(s) Performed: Procedure(s) (LRB): RIGHT FOOT 3RD, 4TH, 5TH METATARSAL (TOE) FRACTURE (Right)     Patient location during evaluation: PACU Anesthesia Type: General Level of consciousness: awake and alert Pain management: pain level controlled Vital Signs Assessment: post-procedure vital signs reviewed and stable Respiratory status: spontaneous breathing, nonlabored ventilation, respiratory function stable and patient connected to nasal cannula oxygen Cardiovascular status: blood pressure returned to baseline and stable Postop Assessment: no signs of nausea or vomiting Anesthetic complications: no    Last Vitals:  Vitals:   06/28/17 1625 06/28/17 1630  BP:  96/80  Pulse: 83 83  Resp: 15 20  Temp:    SpO2: 100% 99%    Last Pain:  Vitals:   06/28/17 1134  TempSrc: Oral                 [name]

## 2017-06-28 NOTE — Anesthesia Procedure Notes (Signed)
Anesthesia Regional Block: Popliteal block   Pre-Anesthetic Checklist: ,, timeout performed, Correct Patient, Correct Site, Correct Laterality, Correct Procedure, Correct Position, site marked, Risks and benefits discussed,  Surgical consent,  Pre-op evaluation,  At surgeon's request and post-op pain management  Laterality: Right  Prep: Dura Prep       Needles:  Injection technique: Single-shot  Needle Type: Stimiplex     Needle Length: 9cm  Needle Gauge: 21     Additional Needles:   Procedures: ultrasound guided,,,,,,,,  Narrative:  Start time: 06/28/2017 12:07 PM End time: 06/28/2017 12:12 PM Injection made incrementally with aspirations every 5 mL.  Performed by: Personally  Anesthesiologist: Candida Peeling RAY

## 2017-06-28 NOTE — Discharge Instructions (Signed)
Diet: As you were doing prior to hospitalization   Shower:  May shower but keep the wounds dry, use an occlusive plastic wrap, NO SOAKING IN TUB.  If the bandage gets wet, change with a clean dry gauze.  If you have a splint on, leave the splint in place and keep the splint dry with a plastic bag.  Dressing:  You may change your dressing 3-5 days after surgery, unless you have a splint.  If you have a splint, then just leave the splint in place and we will change your bandages during your first follow-up appointment.    If you had hand or foot surgery, we will plan to remove your stitches in about 2 weeks in the office.  For all other surgeries, there are sticky tapes (steri-strips) on your wounds and all the stitches are absorbable.  Leave the steri-strips in place when changing your dressings, they will peel off with time, usually 2-3 weeks.  Activity:  Increase activity slowly as tolerated, but follow the weight bearing instructions below.  The rules on driving is that you can not be taking narcotics while you drive, and you must feel in control of the vehicle.    Weight Bearing:   Ok to weight bear in darko shoe, but no weight on forefoot, ok for weight on heel.  .    To prevent constipation: you may use a stool softener such as -  Colace (over the counter) 100 mg by mouth twice a day  Drink plenty of fluids (prune juice may be helpful) and high fiber foods Miralax (over the counter) for constipation as needed.    Itching:  If you experience itching with your medications, try taking only a single pain pill, or even half a pain pill at a time.  You may take up to 10 pain pills per day, and you can also use benadryl over the counter for itching or also to help with sleep.   Precautions:  If you experience chest pain or shortness of breath - call 911 immediately for transfer to the hospital emergency department!!  If you develop a fever greater that 101 F, purulent drainage from wound,  increased redness or drainage from wound, or calf pain -- Call the office at [phone]                                                Follow- Up Appointment:  Please call for an appointment to be seen in 2 weeks [place] - [phone]   Post Anesthesia Home Care Instructions  Activity: Get plenty of rest for the remainder of the day. A responsible individual must stay with you for 24 hours following the procedure.  For the next 24 hours, DO NOT: -Drive a car -Paediatric nurse -Drink alcoholic beverages -Take any medication unless instructed by your physician -Make any legal decisions or sign important papers.  Meals: Start with liquid foods such as gelatin or soup. Progress to regular foods as tolerated. Avoid greasy, spicy, heavy foods. If nausea and/or vomiting occur, drink only clear liquids until the nausea and/or vomiting subsides. Call your physician if vomiting continues.  Special Instructions/Symptoms: Your throat may feel dry or sore from the anesthesia or the breathing tube placed in your throat during surgery. If this causes discomfort, gargle with warm salt water. The discomfort should disappear within 24 hours.  If you had a scopolamine patch placed behind your ear for the management of post- operative nausea and/or vomiting:  1. The medication in the patch is effective for 72 hours, after which it should be removed.  Wrap patch in a tissue and discard in the trash. Wash hands thoroughly with soap and water. 2. You may remove the patch earlier than 72 hours if you experience unpleasant side effects which may include dry mouth, dizziness or visual disturbances. 3. Avoid touching the patch. Wash your hands with soap and water after contact with the patch.   Regional Anesthesia Blocks  1. Numbness or the inability to move the "blocked" extremity may last from 3-48 hours after placement. The length of time depends on the medication injected and your individual response  to the medication. If the numbness is not going away after 48 hours, call your surgeon.  2. The extremity that is blocked will need to be protected until the numbness is gone and the  Strength has returned. Because you cannot feel it, you will need to take extra care to avoid injury. Because it may be weak, you may have difficulty moving it or using it. You may not know what position it is in without looking at it while the block is in effect.  3. For blocks in the legs and feet, returning to weight bearing and walking needs to be done carefully. You will need to wait until the numbness is entirely gone and the strength has returned. You should be able to move your leg and foot normally before you try and bear weight or walk. You will need someone to be with you when you first try to ensure you do not fall and possibly risk injury.  4. Bruising and tenderness at the needle site are common side effects and will resolve in a few days.  5. Persistent numbness or new problems with movement should be communicated to the surgeon or the [place] [phone] [place] [phone]).  Call your surgeon if you experience:   1.  Fever over 101.0. 2.  Inability to urinate. 3.  Nausea and/or vomiting. 4.  Extreme swelling or bruising at the surgical site. 5.  Continued bleeding from the incision. 6.  Increased pain, redness or drainage from the incision. 7.  Problems related to your pain medication. 8.  Any problems and/or concerns

## 2017-06-29 ENCOUNTER — Encounter (HOSPITAL_BASED_OUTPATIENT_CLINIC_OR_DEPARTMENT_OTHER): Payer: Self-pay | Admitting: Orthopedic Surgery

## 2017-07-05 ENCOUNTER — Ambulatory Visit (HOSPITAL_BASED_OUTPATIENT_CLINIC_OR_DEPARTMENT_OTHER): Payer: No Typology Code available for payment source

## 2017-07-05 ENCOUNTER — Other Ambulatory Visit (HOSPITAL_BASED_OUTPATIENT_CLINIC_OR_DEPARTMENT_OTHER): Payer: No Typology Code available for payment source

## 2017-07-05 DIAGNOSIS — C187 Malignant neoplasm of sigmoid colon: Secondary | ICD-10-CM | POA: Diagnosis not present

## 2017-07-05 DIAGNOSIS — Z95828 Presence of other vascular implants and grafts: Secondary | ICD-10-CM

## 2017-07-05 MED ORDER — HEPARIN SOD (PORK) LOCK FLUSH 100 UNIT/ML IV SOLN
500.0000 [IU] | Freq: Once | INTRAVENOUS | Status: AC | PRN
  Administered 2017-07-05: 500 [IU] via INTRAVENOUS
  Filled 2017-07-05: qty 5

## 2017-07-05 MED ORDER — SODIUM CHLORIDE 0.9% FLUSH
10.0000 mL | INTRAVENOUS | Status: DC | PRN
  Administered 2017-07-05: 10 mL via INTRAVENOUS
  Filled 2017-07-05: qty 10

## 2017-07-06 LAB — CEA (IN HOUSE-CHCC): CEA (CHCC-In House): 1.18 ng/mL (ref 0.00–5.00)

## 2017-07-09 ENCOUNTER — Telehealth: Payer: Self-pay | Admitting: *Deleted

## 2017-07-09 NOTE — Telephone Encounter (Signed)
Telephone call to patient to advise lab work as directed below. Patient verbalized an understanding and thanked Probation officer for call.

## 2017-07-09 NOTE — Telephone Encounter (Signed)
-----   Message from Ladell Pier, MD sent at 07/06/2017  2:12 PM EDT ----- Please call patient, cea is normal

## 2017-08-10 ENCOUNTER — Other Ambulatory Visit: Payer: Self-pay | Admitting: *Deleted

## 2017-08-10 DIAGNOSIS — C187 Malignant neoplasm of sigmoid colon: Secondary | ICD-10-CM

## 2017-08-13 ENCOUNTER — Ambulatory Visit (HOSPITAL_BASED_OUTPATIENT_CLINIC_OR_DEPARTMENT_OTHER): Payer: No Typology Code available for payment source

## 2017-08-13 ENCOUNTER — Other Ambulatory Visit (HOSPITAL_BASED_OUTPATIENT_CLINIC_OR_DEPARTMENT_OTHER): Payer: No Typology Code available for payment source

## 2017-08-13 DIAGNOSIS — C187 Malignant neoplasm of sigmoid colon: Secondary | ICD-10-CM

## 2017-08-13 DIAGNOSIS — Z95828 Presence of other vascular implants and grafts: Secondary | ICD-10-CM

## 2017-08-13 LAB — BASIC METABOLIC PANEL
Anion Gap: 8 mEq/L (ref 3–11)
BUN: 17.4 mg/dL (ref 7.0–26.0)
CALCIUM: 9.8 mg/dL (ref 8.4–10.4)
CO2: 24 meq/L (ref 22–29)
Chloride: 108 mEq/L (ref 98–109)
Creatinine: 0.8 mg/dL (ref 0.6–1.1)
EGFR: 84 mL/min/{1.73_m2} — AB (ref >90)
GLUCOSE: 95 mg/dL (ref 70–140)
Potassium: 4.1 mEq/L (ref 3.5–5.1)
Sodium: 140 mEq/L (ref 136–145)

## 2017-08-13 MED ORDER — HEPARIN SOD (PORK) LOCK FLUSH 100 UNIT/ML IV SOLN
500.0000 [IU] | Freq: Once | INTRAVENOUS | Status: AC | PRN
  Administered 2017-08-13: 500 [IU] via INTRAVENOUS
  Filled 2017-08-13: qty 5

## 2017-08-13 MED ORDER — SODIUM CHLORIDE 0.9% FLUSH
10.0000 mL | INTRAVENOUS | Status: DC | PRN
  Administered 2017-08-13: 10 mL via INTRAVENOUS
  Filled 2017-08-13: qty 10

## 2017-08-14 ENCOUNTER — Telehealth: Payer: Self-pay | Admitting: Oncology

## 2017-08-14 ENCOUNTER — Ambulatory Visit (HOSPITAL_BASED_OUTPATIENT_CLINIC_OR_DEPARTMENT_OTHER): Payer: No Typology Code available for payment source | Admitting: Oncology

## 2017-08-14 ENCOUNTER — Ambulatory Visit
Admission: RE | Admit: 2017-08-14 | Discharge: 2017-08-14 | Disposition: A | Discharge status: Home / Self Care | Payer: No Typology Code available for payment source | Source: Ambulatory Visit | Attending: Oncology | Admitting: Oncology

## 2017-08-14 VITALS — BP 138/83 | HR 90 | Temp 98.6°F | Resp 18 | Ht 68.0 in | Wt 141.3 lb

## 2017-08-14 DIAGNOSIS — C187 Malignant neoplasm of sigmoid colon: Secondary | ICD-10-CM | POA: Diagnosis not present

## 2017-08-14 DIAGNOSIS — D701 Agranulocytosis secondary to cancer chemotherapy: Secondary | ICD-10-CM

## 2017-08-14 LAB — CEA (IN HOUSE-CHCC): CEA (CHCC-IN HOUSE): 1.76 ng/mL (ref 0.00–5.00)

## 2017-08-14 MED ORDER — IOPAMIDOL (ISOVUE-300) INJECTION 61%
100.0000 mL | Freq: Once | INTRAVENOUS | Status: AC | PRN
  Administered 2017-08-14: 100 mL via INTRAVENOUS

## 2017-08-14 NOTE — Telephone Encounter (Signed)
Gave avs and calendar for November - March 2019

## 2017-08-14 NOTE — Progress Notes (Signed)
[facility] OFFICE PROGRESS NOTE   Diagnosis: Colon cancer  INTERVAL HISTORY:   Ms. Escudero returns as scheduled. She feels well. She recently fractured her foot in an accident. She underwent surgery. Good appetite and energy level. Minimal remaining neuropathy symptoms.  Objective:  Vital signs in last 24 hours:  Blood pressure 138/83, pulse 90, temperature 98.6 F (37 C), temperature source Oral, resp. rate 18, height '5\' 8"'$  (1.727 m), weight 141 lb 4.8 oz (64.1 kg), last menstrual period 04/20/2013, SpO2 99 %.    HEENT: Neck without mass Lymphatics: No cervical, supraclavicular, axillary, or inguinal nodes Resp: Lungs clear bilaterally Cardio: Regular rate and rhythm GI: No hepatosplenomegaly, no mass, nontender Vascular: No leg edema  Portacath/PICC-without erythema  Lab Results:    Lab Results  Component Value Date   CEA1 1.76 08/13/2017     Imaging:  Ct Chest W Contrast  Result Date: 08/14/2017 CLINICAL DATA:  Followup metastatic colon carcinoma. EXAM: CT CHEST, ABDOMEN, AND PELVIS WITH CONTRAST TECHNIQUE: Multidetector CT imaging of the chest, abdomen and pelvis was performed following the standard protocol during bolus administration of intravenous contrast. CONTRAST:  154m ISOVUE-300 IOPAMIDOL (ISOVUE-300) INJECTION 61% COMPARISON:  02/27/2017 FINDINGS: CT CHEST FINDINGS Cardiovascular: No acute findings. Mediastinum/Lymph Nodes: No masses or pathologically enlarged lymph nodes identified. Lungs/Pleura: No pulmonary infiltrate or mass identified. No effusion present. Musculoskeletal:  No suspicious bone lesions identified. CT ABDOMEN AND PELVIS FINDINGS Hepatobiliary: Stable postop changes from previous right hepatic lobe resection. No evidence of recurrent or new hepatic masses. Scattered small hepatic cysts remain stable. Gallbladder is unremarkable. Pancreas:  No mass or inflammatory changes. Spleen:  Within normal limits in size and appearance.  Adrenals/Urinary tract: Tiny sub-cm subcapsular lesion in lateral interpolar region of left kidney is too small to characterize but remains stable compared to prior MRI on 07/07/2016. No other renal lesions identified. No evidence hydronephrosis. Unremarkable unopacified urinary bladder. Stomach/Bowel: No evidence of obstruction, inflammatory process, or abnormal fluid collections. Vascular/Lymphatic: No pathologically enlarged lymph nodes identified. No abdominal aortic aneurysm. Aortic atherosclerosis. Reproductive:  No mass or other significant abnormality identified. Other:  None. Musculoskeletal:  No suspicious bone lesions identified. IMPRESSION: Stable exam. No evidence of recurrent or metastatic carcinoma within the chest, abdomen, or pelvis. Stable indeterminate sub-cm left renal lesion. Continued attention recommended on follow-up CT. Electronically Signed   By: JEarle GellM.D.   On: 08/14/2017 11:09   Ct Abdomen Pelvis W Contrast  Result Date: 08/14/2017 CLINICAL DATA:  Followup metastatic colon carcinoma. EXAM: CT CHEST, ABDOMEN, AND PELVIS WITH CONTRAST TECHNIQUE: Multidetector CT imaging of the chest, abdomen and pelvis was performed following the standard protocol during bolus administration of intravenous contrast. CONTRAST:  1044mISOVUE-300 IOPAMIDOL (ISOVUE-300) INJECTION 61% COMPARISON:  02/27/2017 FINDINGS: CT CHEST FINDINGS Cardiovascular: No acute findings. Mediastinum/Lymph Nodes: No masses or pathologically enlarged lymph nodes identified. Lungs/Pleura: No pulmonary infiltrate or mass identified. No effusion present. Musculoskeletal:  No suspicious bone lesions identified. CT ABDOMEN AND PELVIS FINDINGS Hepatobiliary: Stable postop changes from previous right hepatic lobe resection. No evidence of recurrent or new hepatic masses. Scattered small hepatic cysts remain stable. Gallbladder is unremarkable. Pancreas:  No mass or inflammatory changes. Spleen:  Within normal limits in size and  appearance. Adrenals/Urinary tract: Tiny sub-cm subcapsular lesion in lateral interpolar region of left kidney is too small to characterize but remains stable compared to prior MRI on 07/07/2016. No other renal lesions identified. No evidence hydronephrosis. Unremarkable unopacified urinary bladder. Stomach/Bowel: No evidence of  obstruction, inflammatory process, or abnormal fluid collections. Vascular/Lymphatic: No pathologically enlarged lymph nodes identified. No abdominal aortic aneurysm. Aortic atherosclerosis. Reproductive:  No mass or other significant abnormality identified. Other:  None. Musculoskeletal:  No suspicious bone lesions identified. IMPRESSION: Stable exam. No evidence of recurrent or metastatic carcinoma within the chest, abdomen, or pelvis. Stable indeterminate sub-cm left renal lesion. Continued attention recommended on follow-up CT. Electronically Signed   By: Earle Gell M.D.   On: 08/14/2017 11:09    Medications: I have reviewed the patient's current medications.  Assessment/Plan: 1. Adenocarcinoma of the sigmoid colon, status post a colonoscopic biopsy 07/07/2016  Mismatch repair protein expression-normal  Sigmoid mass noted at 20 cm from the anal verge  Staging CTs of the chest, abdomen, and pelvis negative for evidence of metastatic disease other than an indeterminate 10 mm right liver lesion  MRI of the abdomen 07/07/2016 revealed multiple bilateral hepatic cyst. 7 mm inferior subcapsular right liver lesion suspicious for a metastasis  PET scan 07/21/2016-hypermetabolic sigmoid colon mass, mildly hypermetabolic segment 6 liver lesion  Laparoscopic assisted rectosigmoid colectomy and segment 8 liver resection 07/27/2016, pathology confirmed a T3, N2b, M1tumor, 11/16 lymph nodes positive for metastatic carcinoma  Foundation 1 testing found the tumor to be microsatellite stable, tumor mutation burden low, no BRAFor RAS mutation  No loss of mismatch repair protein  expression, MSI-stable  Cycle 1 CAPOX 08/22/2016  Cycle 2 CAPOX 09/12/2016  Cycle 3 CAPOX 10/03/2016 (Xeloda dose reduced to 3 tablets every morning and 2 tablets every evening beginning with cycle 3)  Cycle 4 CAPOX 10/31/2016 (Xeloda re-escalated to original dose)  Cycle 5 CAPOX 11/21/2016  Cycle 6 CAPOX 12/12/2016  Cycle 7 CAPOX 01/09/2017 (oxaliplatin held)  Cycle 8 CAPOX 01/30/2017 (oxaliplatin dose reduced)  CT scans 02/27/2017-negative for recurrent colon cancer  CTs 08/14/2017-negative for recurrent cancer  2. Rectal bleeding secondary to #1  3. Multiple polyps on the colonoscopy 07/07/2016  Admission 07/13/2016 with acute GI bleeding secondary to an ulcer at the cecum  4. Family history of breast and pancreas cancer  5. Delayed nausea following cycle 1 CAPOX-emend added with cycle 2  6. Diarrhea following cycle 2 and cycle 6 CAPOX  7. Neutropenia/thrombocytopenia secondary to chemotherapy  8. Mild oxaliplatin neuropathy-improved  Disposition:  Ms. Hogge is in clinical remission from colon cancer. She would like to keep the Port-A-Cath in place. She understands the potential for infection and thrombosis with an indwelling catheter. She will return every 6 weeks for a Port-A-Cath flush. The CEA will be checked in 3 months.  Ms. Ketcher will return for an office visit and restaging CT evaluation in 6 months.   Donneta Romberg, MD  08/14/2017  3:18 PM

## 2017-11-06 ENCOUNTER — Other Ambulatory Visit (HOSPITAL_BASED_OUTPATIENT_CLINIC_OR_DEPARTMENT_OTHER): Payer: No Typology Code available for payment source

## 2017-11-06 ENCOUNTER — Telehealth: Payer: Self-pay | Admitting: *Deleted

## 2017-11-06 ENCOUNTER — Ambulatory Visit (HOSPITAL_BASED_OUTPATIENT_CLINIC_OR_DEPARTMENT_OTHER): Payer: No Typology Code available for payment source

## 2017-11-06 DIAGNOSIS — Z95828 Presence of other vascular implants and grafts: Secondary | ICD-10-CM

## 2017-11-06 DIAGNOSIS — C187 Malignant neoplasm of sigmoid colon: Secondary | ICD-10-CM | POA: Diagnosis not present

## 2017-11-06 LAB — CEA (IN HOUSE-CHCC): CEA (CHCC-In House): 1.52 ng/mL (ref 0.00–5.00)

## 2017-11-06 MED ORDER — SODIUM CHLORIDE 0.9% FLUSH
10.0000 mL | INTRAVENOUS | Status: DC | PRN
  Administered 2017-11-06: 10 mL via INTRAVENOUS
  Filled 2017-11-06: qty 10

## 2017-11-06 MED ORDER — HEPARIN SOD (PORK) LOCK FLUSH 100 UNIT/ML IV SOLN
500.0000 [IU] | Freq: Once | INTRAVENOUS | Status: AC | PRN
  Administered 2017-11-06: 500 [IU] via INTRAVENOUS
  Filled 2017-11-06: qty 5

## 2017-11-06 NOTE — Telephone Encounter (Signed)
-----   Message from Ladell Pier, MD sent at 11/06/2017  4:19 PM EST ----- Please call patient, cea is normal, f/u as scheduled

## 2017-11-06 NOTE — Telephone Encounter (Signed)
Notified patient of normal CEA results and to f/u as scheduled.

## 2018-01-15 ENCOUNTER — Other Ambulatory Visit: Payer: No Typology Code available for payment source

## 2018-01-28 ENCOUNTER — Other Ambulatory Visit: Payer: Self-pay | Admitting: Nurse Practitioner

## 2018-01-28 DIAGNOSIS — Z139 Encounter for screening, unspecified: Secondary | ICD-10-CM

## 2018-01-29 ENCOUNTER — Other Ambulatory Visit: Payer: No Typology Code available for payment source

## 2018-01-29 ENCOUNTER — Ambulatory Visit: Payer: No Typology Code available for payment source | Admitting: Oncology

## 2018-02-12 ENCOUNTER — Inpatient Hospital Stay (HOSPITAL_BASED_OUTPATIENT_CLINIC_OR_DEPARTMENT_OTHER): Payer: No Typology Code available for payment source | Admitting: Oncology

## 2018-02-12 ENCOUNTER — Other Ambulatory Visit: Payer: No Typology Code available for payment source

## 2018-02-12 ENCOUNTER — Inpatient Hospital Stay: Payer: No Typology Code available for payment source | Attending: Oncology

## 2018-02-12 ENCOUNTER — Ambulatory Visit
Admission: RE | Admit: 2018-02-12 | Discharge: 2018-02-12 | Disposition: A | Discharge status: Home / Self Care | Payer: No Typology Code available for payment source | Source: Ambulatory Visit | Attending: Oncology | Admitting: Oncology

## 2018-02-12 ENCOUNTER — Inpatient Hospital Stay: Payer: No Typology Code available for payment source

## 2018-02-12 ENCOUNTER — Telehealth: Payer: Self-pay

## 2018-02-12 ENCOUNTER — Telehealth: Payer: Self-pay | Admitting: Oncology

## 2018-02-12 VITALS — BP 130/81 | HR 87 | Temp 98.2°F | Resp 17 | Ht 68.0 in | Wt 145.7 lb

## 2018-02-12 DIAGNOSIS — Z9221 Personal history of antineoplastic chemotherapy: Secondary | ICD-10-CM | POA: Insufficient documentation

## 2018-02-12 DIAGNOSIS — C187 Malignant neoplasm of sigmoid colon: Secondary | ICD-10-CM

## 2018-02-12 DIAGNOSIS — Z86010 Personal history of colonic polyps: Secondary | ICD-10-CM | POA: Insufficient documentation

## 2018-02-12 DIAGNOSIS — Z803 Family history of malignant neoplasm of breast: Secondary | ICD-10-CM | POA: Diagnosis not present

## 2018-02-12 DIAGNOSIS — Z800 Family history of malignant neoplasm of digestive organs: Secondary | ICD-10-CM | POA: Diagnosis not present

## 2018-02-12 DIAGNOSIS — C787 Secondary malignant neoplasm of liver and intrahepatic bile duct: Secondary | ICD-10-CM | POA: Insufficient documentation

## 2018-02-12 DIAGNOSIS — Z452 Encounter for adjustment and management of vascular access device: Secondary | ICD-10-CM | POA: Diagnosis not present

## 2018-02-12 DIAGNOSIS — Z95828 Presence of other vascular implants and grafts: Secondary | ICD-10-CM

## 2018-02-12 DIAGNOSIS — I700 Atherosclerosis of aorta: Secondary | ICD-10-CM | POA: Insufficient documentation

## 2018-02-12 LAB — BASIC METABOLIC PANEL
Anion gap: 7 (ref 3–11)
BUN: 8 mg/dL (ref 7–26)
CO2: 24 mmol/L (ref 22–29)
Calcium: 9.6 mg/dL (ref 8.4–10.4)
Chloride: 109 mmol/L (ref 98–109)
Creatinine, Ser: 0.76 mg/dL (ref 0.60–1.10)
GFR calc Af Amer: >60 mL/min (ref >60)
GFR calc non Af Amer: >60 mL/min (ref >60)
GLUCOSE: 91 mg/dL (ref 70–140)
POTASSIUM: 4.1 mmol/L (ref 3.5–5.1)
Sodium: 140 mmol/L (ref 136–145)

## 2018-02-12 LAB — CEA (IN HOUSE-CHCC): CEA (CHCC-In House): 1.38 ng/mL (ref 0.00–5.00)

## 2018-02-12 MED ORDER — SODIUM CHLORIDE 0.9% FLUSH
10.0000 mL | INTRAVENOUS | Status: DC | PRN
  Administered 2018-02-12: 10 mL via INTRAVENOUS
  Filled 2018-02-12: qty 10

## 2018-02-12 MED ORDER — HEPARIN SOD (PORK) LOCK FLUSH 100 UNIT/ML IV SOLN
500.0000 [IU] | Freq: Once | INTRAVENOUS | Status: AC | PRN
  Administered 2018-02-12: 500 [IU] via INTRAVENOUS
  Filled 2018-02-12: qty 5

## 2018-02-12 MED ORDER — IOPAMIDOL (ISOVUE-300) INJECTION 61%
75.0000 mL | Freq: Once | INTRAVENOUS | Status: AC | PRN
  Administered 2018-02-12: 100 mL via INTRAVENOUS

## 2018-02-12 NOTE — Telephone Encounter (Addendum)
Pt voiced understanding of message below    ----- Message from Ladell Pier, MD sent at 02/12/2018  1:56 PM EDT ----- Please call patient, cea is normal

## 2018-02-12 NOTE — Telephone Encounter (Signed)
Appointments scheduled AVS/Calendar printed per 3/26 los °

## 2018-02-12 NOTE — Progress Notes (Signed)
[facility] OFFICE PROGRESS NOTE   Diagnosis: Colon cancer  INTERVAL HISTORY:   Patricia Hudson returns as scheduled.  She feels well.  No complaint.  Good appetite and energy level.  The Port-A-Cath was last flushed in December 2018. She underwent a colonoscopy in November 2018.  3 small polyps were removed from the cecum.  The pathology revealed colonic mucosa with benign lymphoid aggregates.  No dysplasia or malignancy.  Objective:  Vital signs in last 24 hours:  Blood pressure 130/81, pulse 87, temperature 98.2 F (36.8 C), temperature source Oral, resp. rate 17, height '5\' 8"'$  (1.727 m), weight 145 lb 11.2 oz (66.1 kg), last menstrual period 04/20/2013, SpO2 100 %.    HEENT: Neck without mass Lymphatics: No cervical, supraclavicular, axillary, or inguinal nodes Resp: Lungs clear bilaterally Cardio: Regular rate and rhythm GI: No hepatosplenomegaly, no mass, nontender Vascular: No leg edema    Portacath/PICC-without erythema  Lab Results:  Lab Results  Component Value Date   CEA1 1.52 11/06/2017     Imaging:  Ct Chest W Contrast  Result Date: 02/12/2018 CLINICAL DATA:  Colon cancer.  Follow-up.  Liver metastasis. EXAM: CT CHEST, ABDOMEN, AND PELVIS WITH CONTRAST TECHNIQUE: Multidetector CT imaging of the chest, abdomen and pelvis was performed following the standard protocol during bolus administration of intravenous contrast. CONTRAST:  175m ISOVUE-300 IOPAMIDOL (ISOVUE-300) INJECTION 61% COMPARISON:  08/14/2017 FINDINGS: CT CHEST FINDINGS Cardiovascular: The heart size appears normal. There is no pericardial effusion identified. Mediastinum/Nodes: No enlarged mediastinal, hilar, or axillary lymph nodes. Thyroid gland, trachea, and esophagus demonstrate no significant findings. No enlarged mediastinal or hilar lymph nodes. Lungs/Pleura: No pleural effusion identified. Stable tiny nodule within the right upper lobe measuring 3 mm, image 18/7. Musculoskeletal: No  suspicious bone lesions. CT ABDOMEN PELVIS FINDINGS Hepatobiliary: Stable liver cyst. Status post partial right hepatectomy. Gallbladder normal. No biliary dilatation. Pancreas: Unremarkable. No pancreatic ductal dilatation or surrounding inflammatory changes. Spleen: Normal in size without focal abnormality. Adrenals/Urinary Tract: The adrenal glands are normal. The lateral interpolar left kidney lesion is again noted. This measures 8 mm in 78 HU. Unchanged from previous exam. Normal appearance of the right kidney. The urinary bladder appears normal. Stomach/Bowel: The stomach is normal. The small bowel loops have a normal course and caliber. No obstruction. No pathologic dilatation of the colon. Postoperative changes at the rectosigmoid junction again noted and appears stable. Vascular/Lymphatic: Aortic atherosclerosis. No aneurysm. No pelvic or inguinal adenopathy identified. Reproductive: Uterus and bilateral adnexa are unremarkable. Other: No abdominal wall hernia or abnormality. No abdominopelvic ascites. Musculoskeletal: No acute or significant osseous findings. IMPRESSION: 1. Stable exam. No evidence of recurrent or metastatic disease within the chest, abdomen or pelvis. 2. Stable appearance of indeterminate subcentimeter left kidney lesion. Attention on follow-up imaging advised. Electronically Signed   By: TKerby MoorsM.D.   On: 02/12/2018 10:22   Ct Abdomen Pelvis W Contrast  Result Date: 02/12/2018 CLINICAL DATA:  Colon cancer.  Follow-up.  Liver metastasis. EXAM: CT CHEST, ABDOMEN, AND PELVIS WITH CONTRAST TECHNIQUE: Multidetector CT imaging of the chest, abdomen and pelvis was performed following the standard protocol during bolus administration of intravenous contrast. CONTRAST:  1030mISOVUE-300 IOPAMIDOL (ISOVUE-300) INJECTION 61% COMPARISON:  08/14/2017 FINDINGS: CT CHEST FINDINGS Cardiovascular: The heart size appears normal. There is no pericardial effusion identified. Mediastinum/Nodes:  No enlarged mediastinal, hilar, or axillary lymph nodes. Thyroid gland, trachea, and esophagus demonstrate no significant findings. No enlarged mediastinal or hilar lymph nodes. Lungs/Pleura: No pleural effusion identified.  Stable tiny nodule within the right upper lobe measuring 3 mm, image 18/7. Musculoskeletal: No suspicious bone lesions. CT ABDOMEN PELVIS FINDINGS Hepatobiliary: Stable liver cyst. Status post partial right hepatectomy. Gallbladder normal. No biliary dilatation. Pancreas: Unremarkable. No pancreatic ductal dilatation or surrounding inflammatory changes. Spleen: Normal in size without focal abnormality. Adrenals/Urinary Tract: The adrenal glands are normal. The lateral interpolar left kidney lesion is again noted. This measures 8 mm in 78 HU. Unchanged from previous exam. Normal appearance of the right kidney. The urinary bladder appears normal. Stomach/Bowel: The stomach is normal. The small bowel loops have a normal course and caliber. No obstruction. No pathologic dilatation of the colon. Postoperative changes at the rectosigmoid junction again noted and appears stable. Vascular/Lymphatic: Aortic atherosclerosis. No aneurysm. No pelvic or inguinal adenopathy identified. Reproductive: Uterus and bilateral adnexa are unremarkable. Other: No abdominal wall hernia or abnormality. No abdominopelvic ascites. Musculoskeletal: No acute or significant osseous findings. IMPRESSION: 1. Stable exam. No evidence of recurrent or metastatic disease within the chest, abdomen or pelvis. 2. Stable appearance of indeterminate subcentimeter left kidney lesion. Attention on follow-up imaging advised. Electronically Signed   By: Kerby Moors M.D.   On: 02/12/2018 10:22    Medications: I have reviewed the patient's current medications.   Assessment/Plan: 1. Adenocarcinoma of the sigmoid colon, status post a colonoscopic biopsy 07/07/2016  Mismatch repair protein expression-normal  Sigmoid mass noted at  20 cm from the anal verge  Staging CTs of the chest, abdomen, and pelvis negative for evidence of metastatic disease other than an indeterminate 10 mm right liver lesion  MRI of the abdomen 07/07/2016 revealed multiple bilateral hepatic cyst. 7 mm inferior subcapsular right liver lesion suspicious for a metastasis  PET scan 07/21/2016-hypermetabolic sigmoid colon mass, mildly hypermetabolic segment 6 liver lesion  Laparoscopic assisted rectosigmoid colectomy and segment 8 liver resection 07/27/2016, pathology confirmed a T3, N2b, M1tumor, 11/16 lymph nodes positive for metastatic carcinoma  Foundation 1 testing found the tumor to be microsatellite stable, tumor mutation burden low, no BRAFor RAS mutation  No loss of mismatch repair protein expression, MSI-stable  Cycle 1 CAPOX 08/22/2016  Cycle 2 CAPOX 09/12/2016  Cycle 3 CAPOX 10/03/2016 (Xeloda dose reduced to 3 tablets every morning and 2 tablets every evening beginning with cycle 3)  Cycle 4 CAPOX 10/31/2016 (Xeloda re-escalated to original dose)  Cycle 5 CAPOX 11/21/2016  Cycle 6 CAPOX 12/12/2016  Cycle 7 CAPOX 01/09/2017 (oxaliplatin held)  Cycle 8 CAPOX 01/30/2017 (oxaliplatin dose reduced)  CT scans 02/27/2017-negative for recurrent colon cancer  CTs 08/14/2017-negative for recurrent cancer  CTs 02/12/2018-negative for recurrent cancer  2. Rectal bleeding secondary to #1  3. Multiple polyps on the colonoscopy 07/07/2016  Admission 07/13/2016 with acute GI bleeding secondary to an ulcer at the cecum  Colonoscopy 10/08/2017-small polyps removed from the cecum-colonic mucosa with lymphoid aggregates-no dysplasia or malignancy  4. Family history of breast and pancreas cancer  5. Delayed nausea following cycle 1 CAPOX-emend added with cycle 2  6. Diarrhea following cycle 2 and cycle 6 CAPOX  7. Neutropenia/thrombocytopenia secondary to chemotherapy  8. Mild oxaliplatin  neuropathy-improved   Disposition: Ms. Escamilla is in clinical remission from colon cancer.  We discussed the indication for removing the Port-A-Cath.  She would like to keep the Port-A-Cath in place for now.  She will return for a Port-A-Cath flush every 6 weeks.  We will check a CEA in 12 weeks.  She will be scheduled for an office visit and restaging CT evaluation in  6 months.  15 minutes were spent with the patient today.  The majority of the time was used for counseling and coordination of care.  Betsy Coder, MD  02/12/2018  12:56 PM

## 2018-02-25 ENCOUNTER — Ambulatory Visit
Admission: RE | Admit: 2018-02-25 | Discharge: 2018-02-25 | Disposition: A | Discharge status: Home / Self Care | Payer: No Typology Code available for payment source | Source: Ambulatory Visit | Attending: Nurse Practitioner | Admitting: Nurse Practitioner

## 2018-02-25 DIAGNOSIS — Z139 Encounter for screening, unspecified: Secondary | ICD-10-CM

## 2018-03-26 ENCOUNTER — Inpatient Hospital Stay: Payer: No Typology Code available for payment source | Attending: Oncology

## 2018-03-26 DIAGNOSIS — Z9221 Personal history of antineoplastic chemotherapy: Secondary | ICD-10-CM | POA: Diagnosis not present

## 2018-03-26 DIAGNOSIS — C187 Malignant neoplasm of sigmoid colon: Secondary | ICD-10-CM | POA: Insufficient documentation

## 2018-03-26 DIAGNOSIS — C787 Secondary malignant neoplasm of liver and intrahepatic bile duct: Secondary | ICD-10-CM | POA: Diagnosis not present

## 2018-03-26 DIAGNOSIS — Z95828 Presence of other vascular implants and grafts: Secondary | ICD-10-CM

## 2018-03-26 DIAGNOSIS — Z452 Encounter for adjustment and management of vascular access device: Secondary | ICD-10-CM | POA: Diagnosis not present

## 2018-03-26 MED ORDER — SODIUM CHLORIDE 0.9% FLUSH
10.0000 mL | INTRAVENOUS | Status: DC | PRN
  Administered 2018-03-26: 10 mL via INTRAVENOUS
  Filled 2018-03-26: qty 10

## 2018-03-26 MED ORDER — HEPARIN SOD (PORK) LOCK FLUSH 100 UNIT/ML IV SOLN
500.0000 [IU] | Freq: Once | INTRAVENOUS | Status: AC | PRN
  Administered 2018-03-26: 500 [IU] via INTRAVENOUS
  Filled 2018-03-26: qty 5

## 2018-03-26 NOTE — Patient Instructions (Signed)

## 2018-04-11 ENCOUNTER — Telehealth: Payer: Self-pay

## 2018-04-11 NOTE — Telephone Encounter (Signed)
Tried to recall the patient to r/s appointments but was unable to talk with her or leave a voice msg. Per 5/23 phone msg return

## 2018-05-07 ENCOUNTER — Other Ambulatory Visit: Payer: No Typology Code available for payment source

## 2018-05-10 ENCOUNTER — Inpatient Hospital Stay: Payer: No Typology Code available for payment source

## 2018-05-10 ENCOUNTER — Inpatient Hospital Stay: Payer: No Typology Code available for payment source | Attending: Oncology

## 2018-05-10 ENCOUNTER — Other Ambulatory Visit: Payer: Self-pay

## 2018-05-10 DIAGNOSIS — C787 Secondary malignant neoplasm of liver and intrahepatic bile duct: Secondary | ICD-10-CM | POA: Diagnosis not present

## 2018-05-10 DIAGNOSIS — C187 Malignant neoplasm of sigmoid colon: Secondary | ICD-10-CM | POA: Diagnosis present

## 2018-05-10 DIAGNOSIS — Z95828 Presence of other vascular implants and grafts: Secondary | ICD-10-CM

## 2018-05-10 DIAGNOSIS — Z9221 Personal history of antineoplastic chemotherapy: Secondary | ICD-10-CM | POA: Insufficient documentation

## 2018-05-10 DIAGNOSIS — Z452 Encounter for adjustment and management of vascular access device: Secondary | ICD-10-CM | POA: Diagnosis not present

## 2018-05-10 LAB — CEA (IN HOUSE-CHCC): CEA (CHCC-In House): 1.06 ng/mL (ref 0.00–5.00)

## 2018-05-10 MED ORDER — HEPARIN SOD (PORK) LOCK FLUSH 100 UNIT/ML IV SOLN
500.0000 [IU] | Freq: Once | INTRAVENOUS | Status: AC | PRN
  Administered 2018-05-10: 500 [IU] via INTRAVENOUS
  Filled 2018-05-10: qty 5

## 2018-05-10 MED ORDER — SODIUM CHLORIDE 0.9% FLUSH
10.0000 mL | INTRAVENOUS | Status: DC | PRN
  Administered 2018-05-10: 10 mL via INTRAVENOUS
  Filled 2018-05-10: qty 10

## 2018-06-10 ENCOUNTER — Ambulatory Visit: Payer: PRIVATE HEALTH INSURANCE | Admitting: Obstetrics and Gynecology

## 2018-06-17 ENCOUNTER — Telehealth: Payer: Self-pay | Admitting: Oncology

## 2018-06-17 NOTE — Telephone Encounter (Signed)
Tried to call per voicemail but mailbox was full

## 2018-06-18 ENCOUNTER — Inpatient Hospital Stay: Payer: No Typology Code available for payment source | Attending: Oncology

## 2018-06-18 DIAGNOSIS — Z452 Encounter for adjustment and management of vascular access device: Secondary | ICD-10-CM | POA: Insufficient documentation

## 2018-06-18 DIAGNOSIS — C187 Malignant neoplasm of sigmoid colon: Secondary | ICD-10-CM | POA: Diagnosis not present

## 2018-06-18 DIAGNOSIS — C787 Secondary malignant neoplasm of liver and intrahepatic bile duct: Secondary | ICD-10-CM | POA: Insufficient documentation

## 2018-06-18 DIAGNOSIS — Z9221 Personal history of antineoplastic chemotherapy: Secondary | ICD-10-CM | POA: Insufficient documentation

## 2018-06-18 DIAGNOSIS — Z95828 Presence of other vascular implants and grafts: Secondary | ICD-10-CM

## 2018-06-18 MED ORDER — HEPARIN SOD (PORK) LOCK FLUSH 100 UNIT/ML IV SOLN
500.0000 [IU] | Freq: Once | INTRAVENOUS | Status: AC | PRN
  Administered 2018-06-18: 500 [IU] via INTRAVENOUS
  Filled 2018-06-18: qty 5

## 2018-06-18 MED ORDER — SODIUM CHLORIDE 0.9% FLUSH
10.0000 mL | INTRAVENOUS | Status: DC | PRN
  Administered 2018-06-18: 10 mL via INTRAVENOUS
  Filled 2018-06-18: qty 10

## 2018-06-18 NOTE — Patient Instructions (Signed)
Implanted Port Home Guide An implanted port is a type of central line that is placed under the skin. Central lines are used to provide IV access when treatment or nutrition needs to be given through a person's veins. Implanted ports are used for long-term IV access. An implanted port may be placed because:  You need IV medicine that would be irritating to the small veins in your hands or arms.  You need long-term IV medicines, such as antibiotics.  You need IV nutrition for a long period.  You need frequent blood draws for lab tests.  You need dialysis.  Implanted ports are usually placed in the chest area, but they can also be placed in the upper arm, the abdomen, or the leg. An implanted port has two main parts:  Reservoir. The reservoir is round and will appear as a small, raised area under your skin. The reservoir is the part where a needle is inserted to give medicines or draw blood.  Catheter. The catheter is a thin, flexible tube that extends from the reservoir. The catheter is placed into a large vein. Medicine that is inserted into the reservoir goes into the catheter and then into the vein.  How will I care for my incision site? Do not get the incision site wet. Bathe or shower as directed by your health care provider. How is my port accessed? Special steps must be taken to access the port:  Before the port is accessed, a numbing cream can be placed on the skin. This helps numb the skin over the port site.  Your health care provider uses a sterile technique to access the port. ? Your health care provider must put on a mask and sterile gloves. ? The skin over your port is cleaned carefully with an antiseptic and allowed to dry. ? The port is gently pinched between sterile gloves, and a needle is inserted into the port.  Only "non-coring" port needles should be used to access the port. Once the port is accessed, a blood return should be checked. This helps ensure that the port  is in the vein and is not clogged.  If your port needs to remain accessed for a constant infusion, a clear (transparent) bandage will be placed over the needle site. The bandage and needle will need to be changed every week, or as directed by your health care provider.  Keep the bandage covering the needle clean and dry. Do not get it wet. Follow your health care provider's instructions on how to take a shower or bath while the port is accessed.  If your port does not need to stay accessed, no bandage is needed over the port.  What is flushing? Flushing helps keep the port from getting clogged. Follow your health care provider's instructions on how and when to flush the port. Ports are usually flushed with saline solution or a medicine called heparin. The need for flushing will depend on how the port is used.  If the port is used for intermittent medicines or blood draws, the port will need to be flushed: ? After medicines have been given. ? After blood has been drawn. ? As part of routine maintenance.  If a constant infusion is running, the port may not need to be flushed.  How long will my port stay implanted? The port can stay in for as long as your health care provider thinks it is needed. When it is time for the port to come out, surgery will be   done to remove it. The procedure is similar to the one performed when the port was put in. When should I seek immediate medical care? When you have an implanted port, you should seek immediate medical care if:  You notice a bad smell coming from the incision site.  You have swelling, redness, or drainage at the incision site.  You have more swelling or pain at the port site or the surrounding area.  You have a fever that is not controlled with medicine.  This information is not intended to replace advice given to you by your health care provider. Make sure you discuss any questions you have with your health care provider. Document  Released: 11/06/2005 Document Revised: 04/13/2016 Document Reviewed: 07/14/2013 Elsevier Interactive Patient Education  2017 [employer]  

## 2018-07-02 NOTE — Progress Notes (Signed)
52 y.o. G45P1001 Married Caucasian Female here for annual exam.  Postmenopausal no vaginal bleeding. Sexually active, no pain.  The patient has a h/o stage 4 colon cancer. S/P surgery and chemo. She had a negative CT scan in 3/19.    Patient's last menstrual period was 04/20/2013.          Sexually active: Yes.    The current method of family planning is post menopausal status.    Exercising: No.  exercise Smoker:  no  Health Maintenance: Pap:  05/30/2016 negative, negative HR HPV History of abnormal Pap:  no MMG:  02/25/2018 BI-RADS CATEGORY  1: Negative. Colonoscopy:  10/08/2017  BMD:   none TDaP:  06/01/2017 Gardasil: none   reports that she has never smoked. She has never used smokeless tobacco. She reports that she drinks alcohol. She reports that she does not use drugs. Rare ETOH. She is an Forensic psychologist, works on [employer]. Son is 33, will be a Paramedic at the [employer]. Husband is a Primary school teacher.   Past Medical History:  Diagnosis Date  . Cancer [facility]) colon cancer   liver mass  . Closed displaced fracture of fifth metatarsal bone of right foot 06/28/2017  . Fracture of fourth metatarsal bone 06/28/2017  . Fracture of third metatarsal bone 06/28/2017  . Headache    migraine  . Renal calculi 1994    Past Surgical History:  Procedure Laterality Date  . COLONOSCOPY    . COLONOSCOPY N/A 07/14/2016   Procedure: COLONOSCOPY;  Surgeon: Carol Ada, MD;  [facility] ENDOSCOPY;  Service: Endoscopy;  Laterality: N/A;  . LAPAROSCOPIC LIVER CYST REMOVAL N/A 07/27/2016   Procedure: REMOVAL OF LIVER MASS;  Surgeon: Jackolyn Confer, MD;  Location: [facility] ORS;  Service: General;  Laterality: N/A;  . LAPAROSCOPIC PARTIAL COLECTOMY N/A 07/27/2016   Procedure: LAPAROSCOPIC PARTIAL COLECTOMY MOBILIZATION OF SPLENIC FLEXURE LAPAROSCOPIC SEGMENTAL LIVER RESECTION OF SEGMENT 8 OF LIVER;  Surgeon: Jackolyn Confer, MD;  Location: [facility] ORS;  Service: General;  Laterality: N/A;  . PERCUTANEOUS  PINNING Right 06/28/2017   Procedure: RIGHT FOOT 3RD, 4TH, 5TH METATARSAL (TOE) FRACTURE;  Surgeon: Marchia Bond, MD;  Location: [place];  Service: Orthopedics;  Laterality: Right;  . PORTACATH PLACEMENT Right 08/17/2016   Procedure: ULTRASOUND GUIDED INSERTION PORT-A-CATH RIGHT INTERNAL JUGULAR;  Surgeon: Jackolyn Confer, MD;  Location: [place];  Service: General;  Laterality: Right;  . TONSILLECTOMY AND ADENOIDECTOMY  1970's    No current outpatient medications on file.   No current facility-administered medications for this visit.     Family History  Problem Relation Age of Onset  . Diabetes Father   . Hypertension Father   . Lung cancer Father 30       smoker  . Colon polyps Father        unspecified number  . Breast cancer Mother 32       triple negative.BRCA Neg  . Colon polyps Mother        unspecified number  . Colon polyps Brother        unspecified number  . Breast cancer Maternal Grandmother        dx late 53s; s/p mastectomy; d. 66y  . Breast cancer Paternal Aunt 52       carcinoma in situ  . Pancreatic cancer Maternal Uncle 23       d. 65y  . Leukemia Other        maternal great uncle (MGM's brother)  . Other  Maternal Grandfather        hx of "half of stomach removed" - not aware of cancer  . Parkinson's disease Paternal Grandfather   . Heart attack Paternal Grandfather        d. 62y  . Cancer Other        paternal great aunt (PGM's sister) d. 56s, abdominal/Gyn cancer; lim info    Review of Systems  Constitutional: Negative.   HENT: Negative.   Eyes: Negative.   Respiratory: Negative.   Cardiovascular: Negative.   Gastrointestinal: Negative.   Endocrine: Negative.   Genitourinary: Negative.   Musculoskeletal: Negative.   Skin: Negative.   Allergic/Immunologic: Negative.   Neurological: Negative.   Hematological: Negative.   Psychiatric/Behavioral: Negative.     Exam:   LMP 04/20/2013   Weight change: '@WEIGHTCHANGE' @ Height:       Ht Readings from Last 3 Encounters:  02/12/18 '5\' 8"'  (1.727 m)  08/14/17 '5\' 8"'  (1.727 m)  06/28/17 '5\' 8"'  (1.727 m)    General appearance: alert, cooperative and appears stated age Head: Normocephalic, without obvious abnormality, atraumatic Neck: no adenopathy, supple, symmetrical, trachea midline and thyroid normal to inspection and palpation Lungs: clear to auscultation bilaterally Cardiovascular: regular rate and rhythm Breasts: normal appearance, no masses or tenderness Abdomen: soft, non-tender; non distended,  no masses,  no organomegaly Extremities: extremities normal, atraumatic, no cyanosis or edema Skin: Skin color, texture, turgor normal. No rashes or lesions Lymph nodes: Cervical, supraclavicular, and axillary nodes normal. No abnormal inguinal nodes palpated Neurologic: Grossly normal   Pelvic: External genitalia:  no lesions              Urethra:  normal appearing urethra with no masses, tenderness or lesions              Bartholins and Skenes: normal                 Vagina: normal appearing vagina with normal color and discharge, no lesions              Cervix: no lesions               Bimanual Exam:  Uterus:  normal size, contour, position, consistency, mobility, non-tender              Adnexa: no mass, fullness, tenderness               Rectovaginal: Confirms               Anus:  normal sphincter tone, no lesions  Chaperone was present for exam.  A:  Well Woman with normal exam  H/O stage 4 colon cancer, cancer free. Next CT is next month    P:   No pap this year  Mammogram and colonoscopy UTD  Discussed breast self exam  Discussed calcium and vit D intake

## 2018-07-04 ENCOUNTER — Other Ambulatory Visit: Payer: Self-pay

## 2018-07-04 ENCOUNTER — Encounter: Payer: Self-pay | Admitting: Obstetrics and Gynecology

## 2018-07-04 ENCOUNTER — Ambulatory Visit (INDEPENDENT_AMBULATORY_CARE_PROVIDER_SITE_OTHER): Payer: PRIVATE HEALTH INSURANCE | Admitting: Obstetrics and Gynecology

## 2018-07-04 VITALS — BP 104/64 | HR 68 | Resp 16 | Ht 68.25 in | Wt 144.0 lb

## 2018-07-04 DIAGNOSIS — Z01419 Encounter for gynecological examination (general) (routine) without abnormal findings: Secondary | ICD-10-CM

## 2018-07-04 DIAGNOSIS — Z0000 Encounter for general adult medical examination without abnormal findings: Secondary | ICD-10-CM | POA: Diagnosis not present

## 2018-07-04 NOTE — Patient Instructions (Signed)
EXERCISE AND DIET:  We recommended that you start or continue a regular exercise program for good health. Regular exercise means any activity that makes your heart beat faster and makes you sweat.  We recommend exercising at least 30 minutes per day at least 3 days a week, preferably 4 or 5.  We also recommend a diet low in fat and sugar.  Inactivity, poor dietary choices and obesity can cause diabetes, heart attack, stroke, and kidney damage, among others.    ALCOHOL AND SMOKING:  Women should limit their alcohol intake to no more than 7 drinks/beers/glasses of wine (combined, not each!) per week. Moderation of alcohol intake to this level decreases your risk of breast cancer and liver damage. And of course, no recreational drugs are part of a healthy lifestyle.  And absolutely no smoking or even second hand smoke. Most people know smoking can cause heart and lung diseases, but did you know it also contributes to weakening of your bones? Aging of your skin?  Yellowing of your teeth and nails?  CALCIUM AND VITAMIN D:  Adequate intake of calcium and Vitamin D are recommended.  The recommendations for exact amounts of these supplements seem to change often, but generally speaking 1,2 Breast Self-Awareness Breast self-awareness means being familiar with how your breasts look and feel. It involves checking your breasts regularly and reporting any changes to your health care provider. Practicing breast self-awareness is important. A change in your breasts can be a sign of a serious medical problem. Being familiar with how your breasts look and feel allows you to find any problems early, when treatment is more likely to be successful. All women should practice breast self-awareness, including women who have had breast implants. How to do a breast self-exam One way to learn what is normal for your breasts and whether your breasts are changing is to do a breast self-exam. To do a breast self-exam: Look for  Changes  1. Remove all the clothing above your waist. 2. Stand in front of a mirror in a room with good lighting. 3. Put your hands on your hips. 4. Push your hands firmly downward. 5. Compare your breasts in the mirror. Look for differences between them (asymmetry), such as: ? Differences in shape. ? Differences in size. ? Puckers, dips, and bumps in one breast and not the other. 6. Look at each breast for changes in your skin, such as: ? Redness. ? Scaly areas. 7. Look for changes in your nipples, such as: ? Discharge. ? Bleeding. ? Dimpling. ? Redness. ? A change in position. Feel for Changes  Carefully feel your breasts for lumps and changes. It is best to do this while lying on your back on the floor and again while sitting or standing in the shower or tub with soapy water on your skin. Feel each breast in the following way:  Place the arm on the side of the breast you are examining above your head.  Feel your breast with the other hand.  Start in the nipple area and make  inch (2 cm) overlapping circles to feel your breast. Use the pads of your three middle fingers to do this. Apply light pressure, then medium pressure, then firm pressure. The light pressure will allow you to feel the tissue closest to the skin. The medium pressure will allow you to feel the tissue that is a little deeper. The firm pressure will allow you to feel the tissue close to the ribs.  Continue the  overlapping circles, moving downward over the breast until you feel your ribs below your breast.  Move one finger-width toward the center of the body. Continue to use the  inch (2 cm) overlapping circles to feel your breast as you move slowly up toward your collarbone.  Continue the up and down exam using all three pressures until you reach your armpit.  Write Down What You Find  Write down what is normal for each breast and any changes that you find. Keep a written record with breast changes or normal  findings for each breast. By writing this information down, you do not need to depend only on memory for size, tenderness, or location. Write down where you are in your menstrual cycle, if you are still menstruating. If you are having trouble noticing differences in your breasts, do not get discouraged. With time you will become more familiar with the variations in your breasts and more comfortable with the exam. How often should I examine my breasts? Examine your breasts every month. If you are breastfeeding, the best time to examine your breasts is after a feeding or after using a breast pump. If you menstruate, the best time to examine your breasts is 5-7 days after your period is over. During your period, your breasts are lumpier, and it may be more difficult to notice changes. When should I see my health care provider? See your health care provider if you notice:  A change in shape or size of your breasts or nipples.  A change in the skin of your breast or nipples, such as a reddened or scaly area.  Unusual discharge from your nipples.  A lump or thick area that was not there before.  Pain in your breasts.  Anything that concerns you.  This information is not intended to replace advice given to you by your health care provider. Make sure you discuss any questions you have with your health care provider. Document Released: 11/06/2005 Document Revised: 04/13/2016 Document Reviewed: 09/26/2015 Elsevier Interactive Patient Education  2018 [place]. 00 mg of calcium (either carbonate or citrate) and 800 units of Vitamin D per day seems prudent. Certain women may benefit from higher intake of Vitamin D.  If you are among these women, your doctor will have told you during your visit.    PAP SMEARS:  Pap smears, to check for cervical cancer or precancers,  have traditionally been done yearly, although recent scientific advances have shown that most women can have pap smears less often.   However, every woman still should have a physical exam from her gynecologist every year. It will include a breast check, inspection of the vulva and vagina to check for abnormal growths or skin changes, a visual exam of the cervix, and then an exam to evaluate the size and shape of the uterus and ovaries.  And after 50 years of age, a rectal exam is indicated to check for rectal cancers. We will also provide age appropriate advice regarding health maintenance, like when you should have certain vaccines, screening for sexually transmitted diseases, bone density testing, colonoscopy, mammograms, etc.   MAMMOGRAMS:  All women over 27 years old should have a yearly mammogram. Many facilities now offer a "3D" mammogram, which may cost around $50 extra out of pocket. If possible,  we recommend you accept the option to have the 3D mammogram performed.  It both reduces the number of women who will be called back for extra views which then turn out to  be normal, and it is better than the routine mammogram at detecting truly abnormal areas.    COLONOSCOPY:  Colonoscopy to screen for colon cancer is recommended for all women at age 63.  We know, you hate the idea of the prep.  We agree, BUT, having colon cancer and not knowing it is worse!!  Colon cancer so often starts as a polyp that can be seen and removed at colonscopy, which can quite literally save your life!  And if your first colonoscopy is normal and you have no family history of colon cancer, most women don't have to have it again for 10 years.  Once every ten years, you can do something that may end up saving your life, right?  We will be happy to help you get it scheduled when you are ready.  Be sure to check your insurance coverage so you understand how much it will cost.  It may be covered as a preventative service at no cost, but you should check your particular policy.

## 2018-07-05 LAB — CBC
HEMATOCRIT: 43.1 % (ref 34.0–46.6)
Hemoglobin: 14.2 g/dL (ref 11.1–15.9)
MCH: 31.3 pg (ref 26.6–33.0)
MCHC: 32.9 g/dL (ref 31.5–35.7)
MCV: 95 fL (ref 79–97)
PLATELETS: 175 10*3/uL (ref 150–450)
RBC: 4.53 x10E6/uL (ref 3.77–5.28)
RDW: 13.8 % (ref 12.3–15.4)
WBC: 4.1 10*3/uL (ref 3.4–10.8)

## 2018-07-05 LAB — LIPID PANEL
Chol/HDL Ratio: 2.6 ratio (ref 0.0–4.4)
Cholesterol, Total: 189 mg/dL (ref 100–199)
HDL: 72 mg/dL (ref >39)
LDL Calculated: 107 mg/dL — ABNORMAL HIGH (ref 0–99)
TRIGLYCERIDES: 52 mg/dL (ref 0–149)
VLDL CHOLESTEROL CAL: 10 mg/dL (ref 5–40)

## 2018-07-09 ENCOUNTER — Other Ambulatory Visit: Payer: Self-pay

## 2018-07-09 DIAGNOSIS — C187 Malignant neoplasm of sigmoid colon: Secondary | ICD-10-CM

## 2018-07-09 NOTE — Progress Notes (Signed)
Received call from [facility] stating that orders need to be reentered for pt Ct Chest and Ct abdomen. Orders reentered to specify [facility] location.

## 2018-07-24 IMAGING — MR MR ABDOMEN WO/W CM
10 of 18 series · 22 of 48 positions shown · IV contrast (multihance)
Comparison: CT of earlier in the day.

CLINICAL DATA: Stage IV colon cancer.  Evaluate liver lesion.

EXAM:
MRI ABDOMEN WITHOUT AND WITH CONTRAST
TECHNIQUE: Multiplanar multisequence MR imaging of the abdomen was performed
both before and after the administration of intravenous contrast.
CONTRAST:  13mL MULTIHANCE GADOBENATE DIMEGLUMINE 529 MG/ML IV SOLN

[Series 3: T2 fat-sat · axial · 5.0mm · 0.78mm/px · z∈[-64,+186]mm · 2 of 51 slices shown]
[im 1/51]
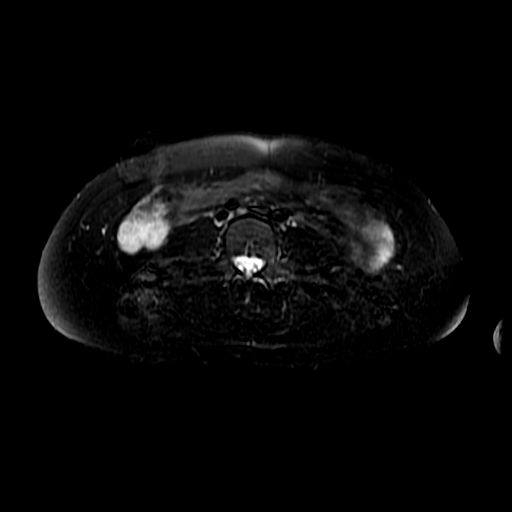
[im 51/51]
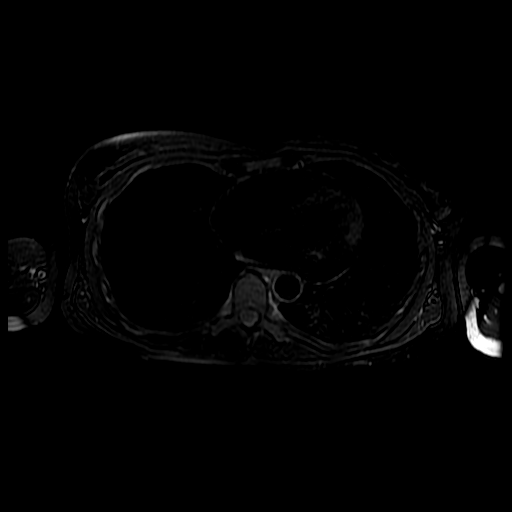

[Series 4: DWI b500 · axial · 5.0mm · 1.56mm/px · z∈[-40,+179]mm · 3 of 90 slices shown]
[im 1/90]
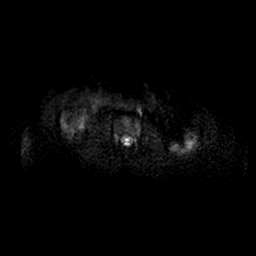
[im 45/90]
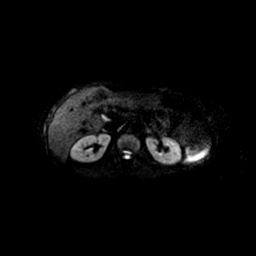
[im 90/90]
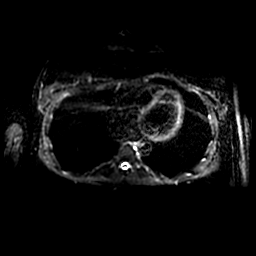

[Series 7: ax dualecho · axial · 5.0mm · 0.78mm/px · z∈[-79,+151]mm · 3 of 94 slices shown]
[im 1/94]
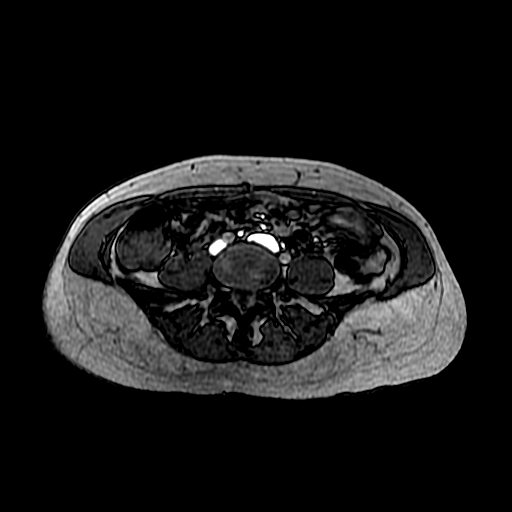
[im 47/94]
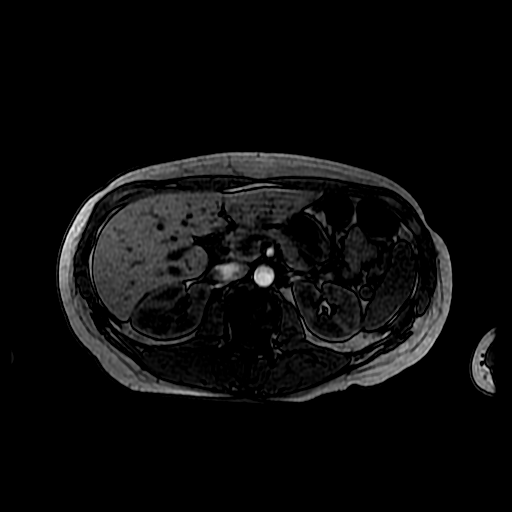
[im 94/94]
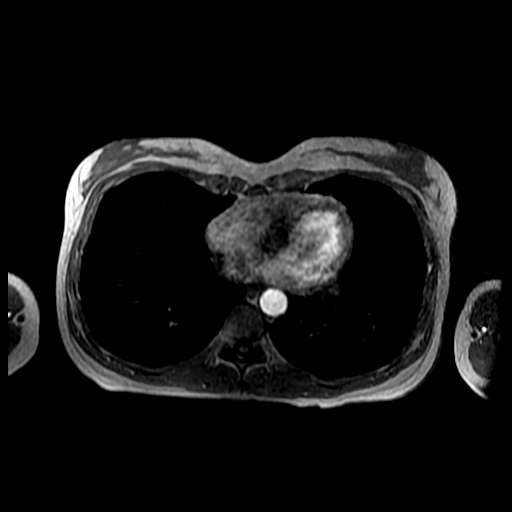

[Series 8: T2 · axial · 5.0mm · 0.78mm/px · z∈[-79,+151]mm · 2 of 67 slices shown (1 of 2)]
[im 1/67]
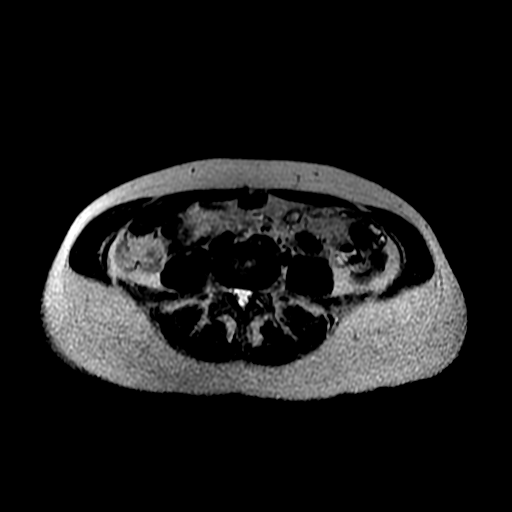
[im 67/67]
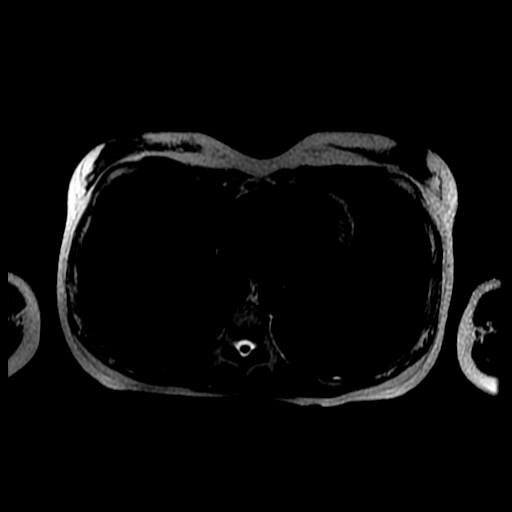

[Series 9: T2 · coronal · 5.0mm · 0.78mm/px · 1 of 40 slices shown (2 of 2)]
[im 1/40]
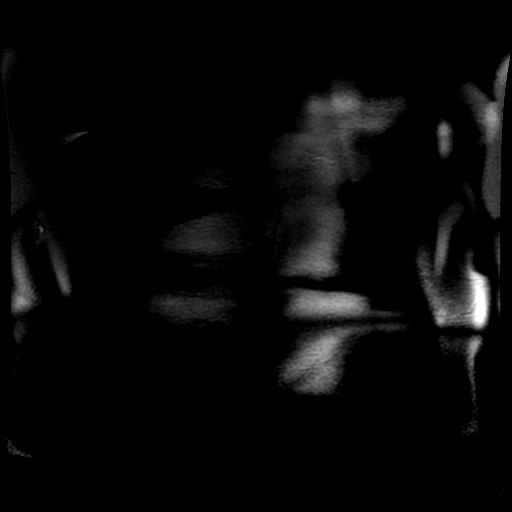

[Series 10: bSSFP · axial · 5.0mm · 0.78mm/px · z∈[-79,+151]mm · 2 of 47 slices shown]
[im 1/47]
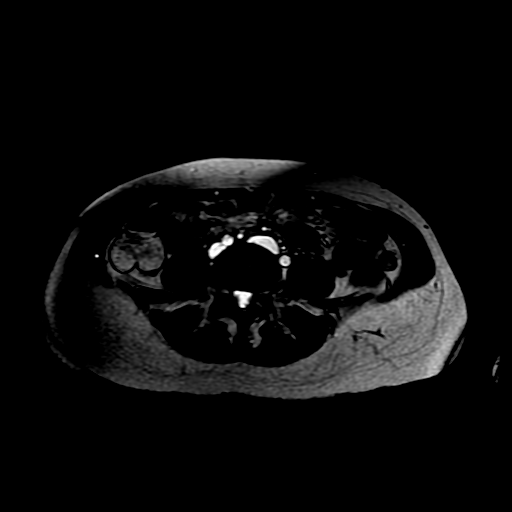
[im 47/47]
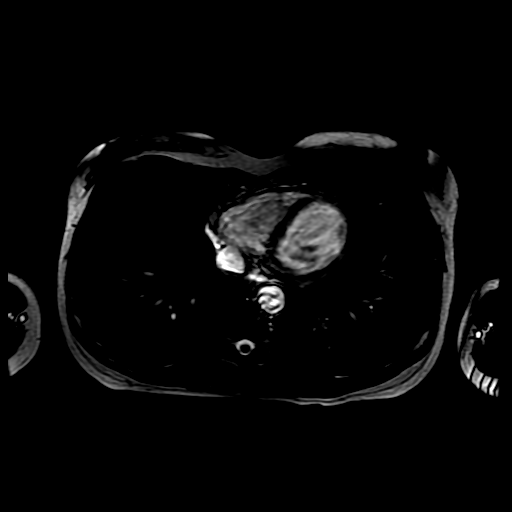

[Series 400: DWI · axial · 5.0mm · 1.56mm/px · z∈[-40,+179]mm · 2 of 45 slices shown]
[im 1/45]
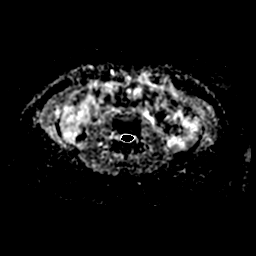
[im 45/45]
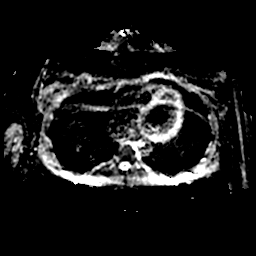

[Series 1100: T1 dynamic · axial · 5.0mm · 0.78mm/px · z∈[-70,+148]mm · 3 of 88 slices shown (1 of 3)]
[im 1/88]
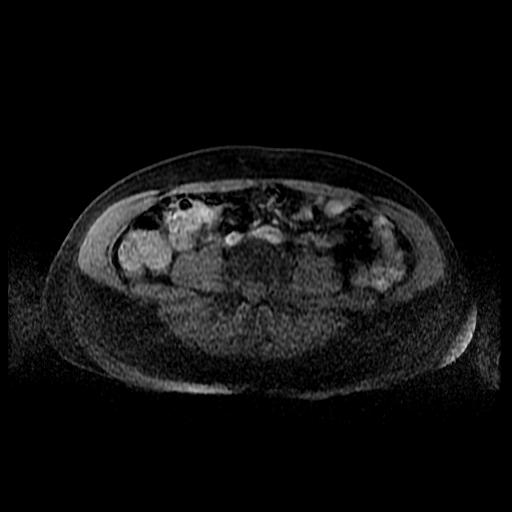
[im 44/88]
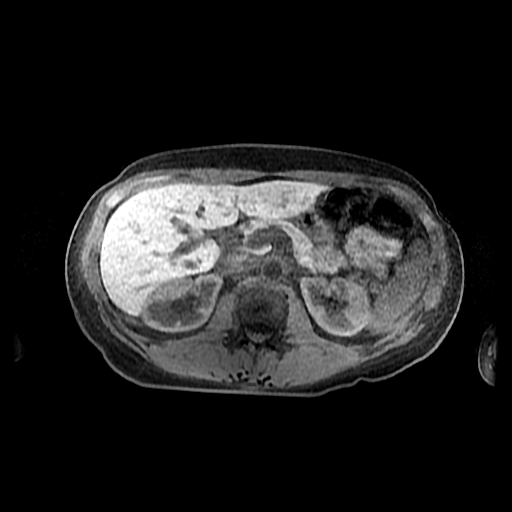
[im 88/88]
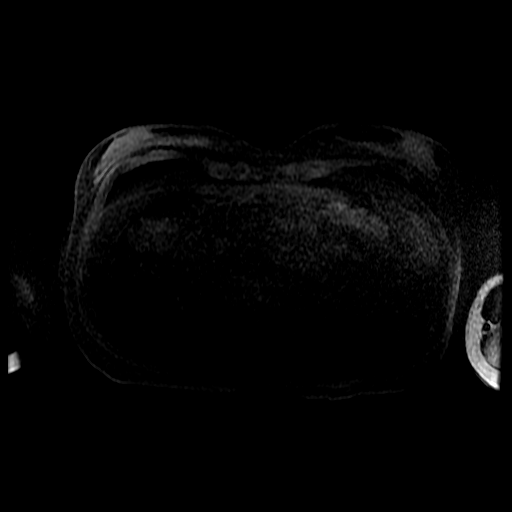

[Series 1101: T1 dynamic · axial · 5.0mm · 0.78mm/px · z∈[-70,+148]mm · 3 of 88 slices shown (2 of 3)]
[im 1/88]
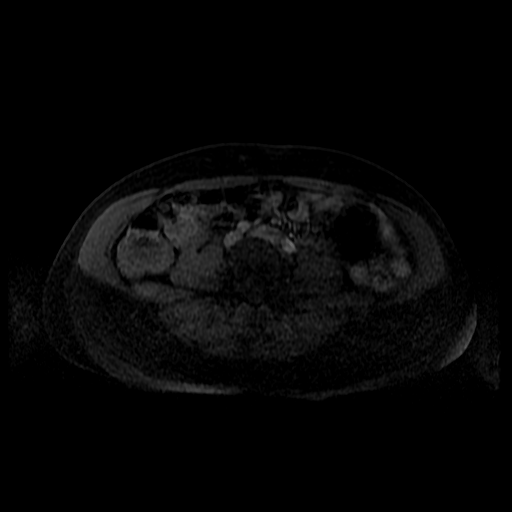
[im 44/88]
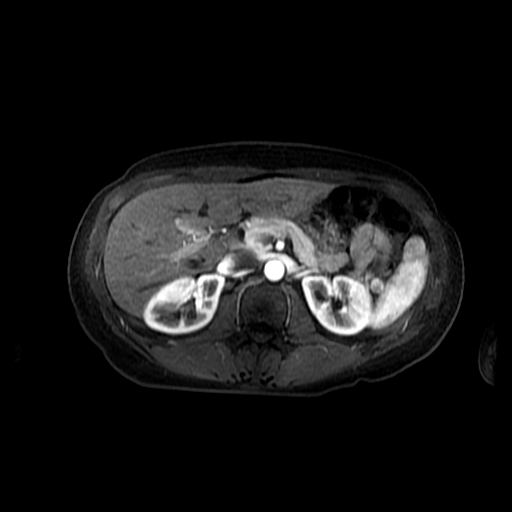
[im 88/88]
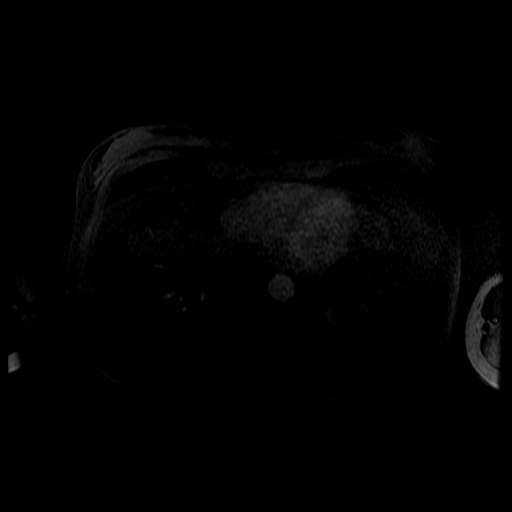

[Series 1102: T1 dynamic · axial · 5.0mm · 0.78mm/px · 1 of 88 slices shown (3 of 3)]
[im 1/88]
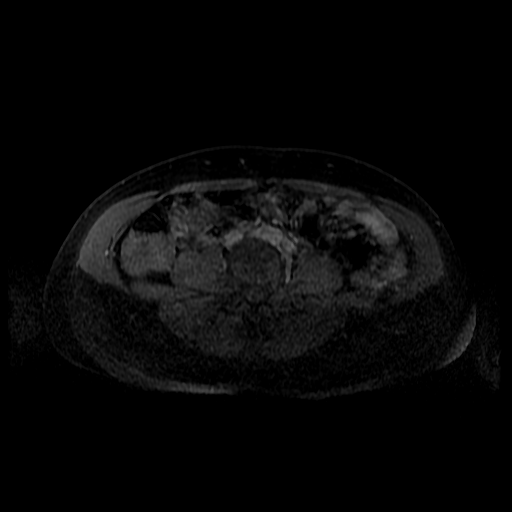

[22 of 48 positions shown; findings below may reference images not displayed]

FINDINGS: Lower chest: Normal heart size without pericardial or pleural
effusion.

Hepatobiliary: Multiple bilateral hepatic cysts. A minimally complex
cyst is identified within the high right hepatic lobe on image 29/
series 9993 and image 41/series 13.

Within the inferior subcapsular right hepatic lobe, an approximately
7 mm lesion demonstrates suspicious characteristics. This is mildly
T2 hyperintense on image 32/series 3. Demonstrates restricted
diffusion image 14/series 400. Vague peripheral enhancement on image
66/ series 5577.

Normal gallbladder, without biliary ductal dilatation.

Pancreas: Normal, without mass or ductal dilatation.

Spleen: Normal in size, without focal abnormality.

Adrenals/Urinary Tract: Normal adrenal glands. Bilateral renal
cysts. No hydronephrosis.

Stomach/Bowel: Normal stomach and abdominal bowel loops.

Vascular/Lymphatic: Normal caliber of the aorta and branch vessels.
No retroperitoneal or retrocrural adenopathy.

Other: No ascites.  No evidence of omental or peritoneal disease.

Musculoskeletal: No acute osseous abnormality.
IMPRESSION: 1. Isolated 7 mm inferior right hepatic lobe lesion is suspicious
for metastasis. Other liver lesions are benign.
2. No evidence of extrahepatic metastatic disease in the abdomen.

## 2018-07-25 ENCOUNTER — Other Ambulatory Visit: Payer: Self-pay

## 2018-07-25 DIAGNOSIS — C187 Malignant neoplasm of sigmoid colon: Secondary | ICD-10-CM

## 2018-07-29 ENCOUNTER — Ambulatory Visit (HOSPITAL_COMMUNITY): Payer: No Typology Code available for payment source

## 2018-07-29 ENCOUNTER — Telehealth: Payer: Self-pay | Admitting: Oncology

## 2018-07-29 ENCOUNTER — Encounter: Payer: Self-pay | Admitting: Oncology

## 2018-07-29 ENCOUNTER — Inpatient Hospital Stay: Payer: No Typology Code available for payment source | Attending: Oncology

## 2018-07-29 ENCOUNTER — Ambulatory Visit
Admission: RE | Admit: 2018-07-29 | Discharge: 2018-07-29 | Disposition: A | Discharge status: Home / Self Care | Payer: No Typology Code available for payment source | Source: Ambulatory Visit | Attending: Oncology | Admitting: Oncology

## 2018-07-29 ENCOUNTER — Inpatient Hospital Stay (HOSPITAL_BASED_OUTPATIENT_CLINIC_OR_DEPARTMENT_OTHER): Payer: No Typology Code available for payment source | Admitting: Oncology

## 2018-07-29 ENCOUNTER — Inpatient Hospital Stay: Payer: No Typology Code available for payment source

## 2018-07-29 ENCOUNTER — Ambulatory Visit: Payer: No Typology Code available for payment source | Admitting: Oncology

## 2018-07-29 VITALS — BP 110/69 | HR 96 | Temp 99.0°F | Resp 16 | Ht 68.25 in | Wt 144.1 lb

## 2018-07-29 DIAGNOSIS — Z85038 Personal history of other malignant neoplasm of large intestine: Secondary | ICD-10-CM

## 2018-07-29 DIAGNOSIS — C187 Malignant neoplasm of sigmoid colon: Secondary | ICD-10-CM

## 2018-07-29 DIAGNOSIS — Z9221 Personal history of antineoplastic chemotherapy: Secondary | ICD-10-CM | POA: Diagnosis not present

## 2018-07-29 DIAGNOSIS — Z95828 Presence of other vascular implants and grafts: Secondary | ICD-10-CM

## 2018-07-29 LAB — BASIC METABOLIC PANEL - CANCER CENTER ONLY
Anion gap: 10 (ref 5–15)
BUN: 15 mg/dL (ref 6–20)
CHLORIDE: 109 mmol/L (ref 98–111)
CO2: 23 mmol/L (ref 22–32)
CREATININE: 0.8 mg/dL (ref 0.44–1.00)
Calcium: 9.9 mg/dL (ref 8.9–10.3)
GFR, Estimated: >60 mL/min (ref >60)
Glucose, Bld: 93 mg/dL (ref 70–99)
Potassium: 4.3 mmol/L (ref 3.5–5.1)
Sodium: 142 mmol/L (ref 135–145)

## 2018-07-29 LAB — CEA (IN HOUSE-CHCC): CEA (CHCC-IN HOUSE): 1.08 ng/mL (ref 0.00–5.00)

## 2018-07-29 MED ORDER — IOPAMIDOL (ISOVUE-300) INJECTION 61%
100.0000 mL | Freq: Once | INTRAVENOUS | Status: AC | PRN
  Administered 2018-07-29: 100 mL via INTRAVENOUS

## 2018-07-29 MED ORDER — SODIUM CHLORIDE 0.9% FLUSH
10.0000 mL | INTRAVENOUS | Status: DC | PRN
  Administered 2018-07-29: 10 mL via INTRAVENOUS
  Filled 2018-07-29: qty 10

## 2018-07-29 NOTE — Telephone Encounter (Signed)
Appts scheduled / referral place through proficient/ contrast given per 9/9 los

## 2018-07-29 NOTE — Progress Notes (Signed)
[facility] OFFICE PROGRESS NOTE   Diagnosis: Colon cancer  INTERVAL HISTORY:   [name] returns as scheduled.  She feels completely well.  Good appetite and energy level.  No complaint.  Objective:  Vital signs in last 24 hours:  Blood pressure 110/69, pulse 96, temperature 99 F (37.2 C), temperature source Oral, resp. rate 16, height 5' 8.25" (1.734 m), weight 144 lb 1.6 oz (65.4 kg), last menstrual period 04/20/2013, SpO2 98 %.    HEENT: Neck without mass Lymphatics: No cervical, supraclavicular, axillary, or inguinal nodes Resp: Lungs clear bilaterally Cardio: Regular rate and rhythm GI: No hepatosplenomegaly, no mass, nontender Vascular: No leg edema  Portacath/PICC-without erythema  Lab Results:    Lab Results  Component Value Date   CEA1 1.08 07/29/2018     Imaging:  Ct Chest W Contrast  Result Date: 07/29/2018 CLINICAL DATA:  Stage IV colon cancer. EXAM: CT CHEST, ABDOMEN, AND PELVIS WITH CONTRAST TECHNIQUE: Multidetector CT imaging of the chest, abdomen and pelvis was performed following the standard protocol during bolus administration of intravenous contrast. CONTRAST:  162m ISOVUE-300 IOPAMIDOL (ISOVUE-300) INJECTION 61% COMPARISON:  02/12/2018 FINDINGS: CT CHEST FINDINGS Cardiovascular: The heart size is normal. No substantial pericardial effusion. Right Port-A-Cath tip is positioned in the mid to distal SVC. Mediastinum/Nodes: No mediastinal lymphadenopathy. There is no hilar lymphadenopathy. The esophagus has normal imaging features. There is no axillary lymphadenopathy. Lungs/Pleura: Tiny right apical pulmonary nodule (4:17) is unchanged and compatible with scar. No new or suspicious pulmonary nodule or mass. No focal airspace consolidation. No pleural effusion. Musculoskeletal: No worrisome lytic or sclerotic osseous abnormality. CT ABDOMEN PELVIS FINDINGS Hepatobiliary: Stable appearance multiple hepatic cysts. Evidence of prior partial  right hepatectomy. There is no evidence for gallstones, gallbladder wall thickening, or pericholecystic fluid. No intrahepatic or extrahepatic biliary dilation. Pancreas: No focal mass lesion. No dilatation of the main duct. No intraparenchymal cyst. No peripancreatic edema. Spleen: No splenomegaly. No focal mass lesion. Adrenals/Urinary Tract: No adrenal nodule or mass. Right kidney unremarkable. 8 mm subcapsular lesion interpolar left kidney is stable. No evidence for hydroureter. The urinary bladder appears normal for the degree of distention. Stomach/Bowel: Stomach is nondistended. No gastric wall thickening. No evidence of outlet obstruction. Duodenum is normally positioned as is the ligament of Treitz. No small bowel wall thickening. No small bowel dilatation. The terminal ileum is normal. The appendix is normal. No gross colonic mass. No colonic wall thickening. No substantial diverticular change. Vascular/Lymphatic: There is abdominal aortic atherosclerosis without aneurysm. There is no gastrohepatic or hepatoduodenal ligament lymphadenopathy. No intraperitoneal or retroperitoneal lymphadenopathy. No pelvic sidewall lymphadenopathy. Reproductive: The uterus has normal CT imaging appearance. There is no adnexal mass. Other: No intraperitoneal free fluid. Musculoskeletal: Sclerotic left iliac lesion along the SI joint is unchanged. No worrisome lytic or sclerotic osseous abnormality. IMPRESSION: 1. Stable exam. No new or progressive interval findings to suggest metastatic disease in the chest, abdomen, or pelvis. 2. No change 8 mm subcapsular lesion interpolar left kidney. Continued attention on follow-up recommended. Electronically Signed   By: EMisty StanleyM.D.   On: 07/29/2018 11:49   Ct Abdomen Pelvis W Contrast  Result Date: 07/29/2018 CLINICAL DATA:  Stage IV colon cancer. EXAM: CT CHEST, ABDOMEN, AND PELVIS WITH CONTRAST TECHNIQUE: Multidetector CT imaging of the chest, abdomen and pelvis was  performed following the standard protocol during bolus administration of intravenous contrast. CONTRAST:  1020mISOVUE-300 IOPAMIDOL (ISOVUE-300) INJECTION 61% COMPARISON:  02/12/2018 FINDINGS: CT CHEST FINDINGS Cardiovascular: The heart  size is normal. No substantial pericardial effusion. Right Port-A-Cath tip is positioned in the mid to distal SVC. Mediastinum/Nodes: No mediastinal lymphadenopathy. There is no hilar lymphadenopathy. The esophagus has normal imaging features. There is no axillary lymphadenopathy. Lungs/Pleura: Tiny right apical pulmonary nodule (4:17) is unchanged and compatible with scar. No new or suspicious pulmonary nodule or mass. No focal airspace consolidation. No pleural effusion. Musculoskeletal: No worrisome lytic or sclerotic osseous abnormality. CT ABDOMEN PELVIS FINDINGS Hepatobiliary: Stable appearance multiple hepatic cysts. Evidence of prior partial right hepatectomy. There is no evidence for gallstones, gallbladder wall thickening, or pericholecystic fluid. No intrahepatic or extrahepatic biliary dilation. Pancreas: No focal mass lesion. No dilatation of the main duct. No intraparenchymal cyst. No peripancreatic edema. Spleen: No splenomegaly. No focal mass lesion. Adrenals/Urinary Tract: No adrenal nodule or mass. Right kidney unremarkable. 8 mm subcapsular lesion interpolar left kidney is stable. No evidence for hydroureter. The urinary bladder appears normal for the degree of distention. Stomach/Bowel: Stomach is nondistended. No gastric wall thickening. No evidence of outlet obstruction. Duodenum is normally positioned as is the ligament of Treitz. No small bowel wall thickening. No small bowel dilatation. The terminal ileum is normal. The appendix is normal. No gross colonic mass. No colonic wall thickening. No substantial diverticular change. Vascular/Lymphatic: There is abdominal aortic atherosclerosis without aneurysm. There is no gastrohepatic or hepatoduodenal ligament  lymphadenopathy. No intraperitoneal or retroperitoneal lymphadenopathy. No pelvic sidewall lymphadenopathy. Reproductive: The uterus has normal CT imaging appearance. There is no adnexal mass. Other: No intraperitoneal free fluid. Musculoskeletal: Sclerotic left iliac lesion along the SI joint is unchanged. No worrisome lytic or sclerotic osseous abnormality. IMPRESSION: 1. Stable exam. No new or progressive interval findings to suggest metastatic disease in the chest, abdomen, or pelvis. 2. No change 8 mm subcapsular lesion interpolar left kidney. Continued attention on follow-up recommended. Electronically Signed   By: Misty Stanley M.D.   On: 07/29/2018 11:49    Medications: I have reviewed the patient's current medications.   Assessment/Plan:  1. Adenocarcinoma of the sigmoid colon, status post a colonoscopic biopsy 07/07/2016  Mismatch repair protein expression-normal  Sigmoid mass noted at 20 cm from the anal verge  Staging CTs of the chest, abdomen, and pelvis negative for evidence of metastatic disease other than an indeterminate 10 mm right liver lesion  MRI of the abdomen 07/07/2016 revealed multiple bilateral hepatic cyst. 7 mm inferior subcapsular right liver lesion suspicious for a metastasis  PET scan 07/21/2016-hypermetabolic sigmoid colon mass, mildly hypermetabolic segment 6 liver lesion  Laparoscopic assisted rectosigmoid colectomy and segment 8 liver resection 07/27/2016, pathology confirmed a T3, N2b, M1tumor, 11/16 lymph nodes positive for metastatic carcinoma  Foundation 1 testing found the tumor to be microsatellite stable, tumor mutation burden low, no BRAFor RAS mutation  No loss of mismatch repair protein expression, MSI-stable  Cycle 1 CAPOX 08/22/2016  Cycle 2 CAPOX 09/12/2016  Cycle 3 CAPOX 10/03/2016 (Xeloda dose reduced to 3 tablets every morning and 2 tablets every evening beginning with cycle 3)  Cycle 4 CAPOX 10/31/2016 (Xeloda re-escalated to  original dose)  Cycle 5 CAPOX 11/21/2016  Cycle 6 CAPOX 12/12/2016  Cycle 7 CAPOX 01/09/2017 (oxaliplatin held)  Cycle 8 CAPOX 01/30/2017 (oxaliplatin dose reduced)  CT scans 02/27/2017-negative for recurrent colon cancer  CTs 08/14/2017-negative for recurrent cancer  CTs 02/12/2018-negative for recurrent cancer  CTs 07/29/2018-negative for recurrent cancer  2. 3 of rectal bleeding secondary to #1  3. Multiple polyps on the colonoscopy 07/07/2016  Admission 07/13/2016 with acute GI  bleeding secondary to an ulcer at the cecum  Colonoscopy 10/08/2017-small polyps removed from the cecum-colonic mucosa with lymphoid aggregates-no dysplasia or malignancy  4. Family history of breast and pancreas cancer  5. Delayed nausea following cycle 1 CAPOX-emend added with cycle 2  6. Diarrhea following cycle 2 and cycle 6 CAPOX  7. Neutropenia/thrombocytopenia secondary to chemotherapy  8. Mild oxaliplatin neuropathy- resolved   Disposition: Patricia Hudson remains in clinical remission from colon cancer.  She will return for an office visit and restaging CTs in 6 months.  We will refer her to Dr. Barry Dienes for removal of the Port-A-Cath.  Betsy Coder, MD  07/29/2018  1:01 PM

## 2018-08-05 ENCOUNTER — Other Ambulatory Visit: Payer: Self-pay | Admitting: General Surgery

## 2018-08-12 ENCOUNTER — Other Ambulatory Visit: Payer: Self-pay

## 2018-08-12 ENCOUNTER — Encounter (HOSPITAL_COMMUNITY): Payer: Self-pay | Admitting: *Deleted

## 2018-08-14 ENCOUNTER — Encounter (HOSPITAL_COMMUNITY): Payer: Self-pay | Admitting: Anesthesiology

## 2018-08-14 ENCOUNTER — Ambulatory Visit (HOSPITAL_COMMUNITY): Payer: No Typology Code available for payment source | Admitting: Anesthesiology

## 2018-08-14 ENCOUNTER — Ambulatory Visit (HOSPITAL_COMMUNITY)
Admission: RE | Admit: 2018-08-14 | Discharge: 2018-08-14 | Disposition: A | Discharge status: Home / Self Care | Payer: No Typology Code available for payment source | Source: Ambulatory Visit | Attending: General Surgery | Admitting: General Surgery

## 2018-08-14 ENCOUNTER — Encounter (HOSPITAL_COMMUNITY)
Admission: RE | Disposition: A | Discharge status: Home / Self Care | Payer: Self-pay | Source: Ambulatory Visit | Attending: General Surgery

## 2018-08-14 DIAGNOSIS — Z85038 Personal history of other malignant neoplasm of large intestine: Secondary | ICD-10-CM | POA: Diagnosis not present

## 2018-08-14 DIAGNOSIS — Z452 Encounter for adjustment and management of vascular access device: Secondary | ICD-10-CM | POA: Insufficient documentation

## 2018-08-14 DIAGNOSIS — Z8505 Personal history of malignant neoplasm of liver: Secondary | ICD-10-CM | POA: Insufficient documentation

## 2018-08-14 DIAGNOSIS — Z882 Allergy status to sulfonamides status: Secondary | ICD-10-CM | POA: Diagnosis not present

## 2018-08-14 DIAGNOSIS — Z9049 Acquired absence of other specified parts of digestive tract: Secondary | ICD-10-CM | POA: Diagnosis not present

## 2018-08-14 HISTORY — PX: PORT-A-CATH REMOVAL: SHX5289

## 2018-08-14 LAB — CBC
HEMATOCRIT: 41.5 % (ref 36.0–46.0)
HEMOGLOBIN: 14.1 g/dL (ref 12.0–15.0)
MCH: 31.1 pg (ref 26.0–34.0)
MCHC: 34 g/dL (ref 30.0–36.0)
MCV: 91.4 fL (ref 78.0–100.0)
Platelets: 166 10*3/uL (ref 150–400)
RBC: 4.54 MIL/uL (ref 3.87–5.11)
RDW: 12.7 % (ref 11.5–15.5)
WBC: 3.9 10*3/uL — ABNORMAL LOW (ref 4.0–10.5)

## 2018-08-14 SURGERY — REMOVAL PORT-A-CATH
Anesthesia: Monitor Anesthesia Care

## 2018-08-14 MED ORDER — FENTANYL CITRATE (PF) 100 MCG/2ML IJ SOLN
INTRAMUSCULAR | Status: DC | PRN
  Administered 2018-08-14: 25 ug via INTRAVENOUS
  Administered 2018-08-14: 75 ug via INTRAVENOUS

## 2018-08-14 MED ORDER — LIDOCAINE 2% (20 MG/ML) 5 ML SYRINGE
INTRAMUSCULAR | Status: AC
  Filled 2018-08-14: qty 5

## 2018-08-14 MED ORDER — CHLORHEXIDINE GLUCONATE CLOTH 2 % EX PADS: 6.0000 | MEDICATED_PAD | Freq: Once | CUTANEOUS | Status: DC

## 2018-08-14 MED ORDER — PROPOFOL 500 MG/50ML IV EMUL
INTRAVENOUS | Status: DC | PRN
  Administered 2018-08-14: 75 ug/kg/min via INTRAVENOUS

## 2018-08-14 MED ORDER — CEFAZOLIN SODIUM-DEXTROSE 2-4 GM/100ML-% IV SOLN
2.0000 g | INTRAVENOUS | Status: AC
  Administered 2018-08-14: 2 g via INTRAVENOUS

## 2018-08-14 MED ORDER — LACTATED RINGERS IV SOLN
Freq: Once | INTRAVENOUS | Status: AC
  Administered 2018-08-14: 07:00:00 via INTRAVENOUS

## 2018-08-14 MED ORDER — BUPIVACAINE-EPINEPHRINE 0.25% -1:200000 IJ SOLN
INTRAMUSCULAR | Status: DC | PRN
  Administered 2018-08-14: 30 mL

## 2018-08-14 MED ORDER — ONDANSETRON HCL 4 MG/2ML IJ SOLN
INTRAMUSCULAR | Status: DC | PRN
  Administered 2018-08-14: 4 mg via INTRAVENOUS

## 2018-08-14 MED ORDER — GABAPENTIN 300 MG PO CAPS
ORAL_CAPSULE | ORAL | Status: AC
  Filled 2018-08-14: qty 1

## 2018-08-14 MED ORDER — ACETAMINOPHEN 500 MG PO TABS
ORAL_TABLET | ORAL | Status: AC
  Filled 2018-08-14: qty 2

## 2018-08-14 MED ORDER — BUPIVACAINE-EPINEPHRINE (PF) 0.25% -1:200000 IJ SOLN
INTRAMUSCULAR | Status: AC
  Filled 2018-08-14: qty 30

## 2018-08-14 MED ORDER — CEFAZOLIN SODIUM-DEXTROSE 2-4 GM/100ML-% IV SOLN
INTRAVENOUS | Status: AC
  Filled 2018-08-14: qty 100

## 2018-08-14 MED ORDER — ACETAMINOPHEN 500 MG PO TABS
1000.0000 mg | ORAL_TABLET | ORAL | Status: AC
  Administered 2018-08-14: 1000 mg via ORAL

## 2018-08-14 MED ORDER — MIDAZOLAM HCL 2 MG/2ML IJ SOLN
INTRAMUSCULAR | Status: AC
  Filled 2018-08-14: qty 2

## 2018-08-14 MED ORDER — LIDOCAINE HCL (CARDIAC) PF 100 MG/5ML IV SOSY
PREFILLED_SYRINGE | INTRAVENOUS | Status: DC | PRN
  Administered 2018-08-14: 80 mg via INTRATRACHEAL

## 2018-08-14 MED ORDER — DEXAMETHASONE SODIUM PHOSPHATE 10 MG/ML IJ SOLN
INTRAMUSCULAR | Status: AC
  Filled 2018-08-14: qty 2

## 2018-08-14 MED ORDER — ROCURONIUM BROMIDE 10 MG/ML (PF) SYRINGE
PREFILLED_SYRINGE | INTRAVENOUS | Status: AC
  Filled 2018-08-14: qty 20

## 2018-08-14 MED ORDER — LIDOCAINE 2% (20 MG/ML) 5 ML SYRINGE
INTRAMUSCULAR | Status: AC
  Filled 2018-08-14: qty 10

## 2018-08-14 MED ORDER — PROPOFOL 10 MG/ML IV BOLUS
INTRAVENOUS | Status: AC
  Filled 2018-08-14: qty 20

## 2018-08-14 MED ORDER — LIDOCAINE HCL (PF) 1 % IJ SOLN
INTRAMUSCULAR | Status: AC
  Filled 2018-08-14: qty 30

## 2018-08-14 MED ORDER — ONDANSETRON HCL 4 MG/2ML IJ SOLN
INTRAMUSCULAR | Status: AC
  Filled 2018-08-14: qty 4

## 2018-08-14 MED ORDER — SUCCINYLCHOLINE CHLORIDE 200 MG/10ML IV SOSY
PREFILLED_SYRINGE | INTRAVENOUS | Status: AC
  Filled 2018-08-14: qty 30

## 2018-08-14 MED ORDER — LACTATED RINGERS IV SOLN
INTRAVENOUS | Status: DC | PRN
  Administered 2018-08-14: 09:00:00 via INTRAVENOUS

## 2018-08-14 MED ORDER — PROPOFOL 10 MG/ML IV BOLUS
INTRAVENOUS | Status: AC
  Filled 2018-08-14: qty 40

## 2018-08-14 MED ORDER — GABAPENTIN 300 MG PO CAPS
300.0000 mg | ORAL_CAPSULE | ORAL | Status: AC
  Administered 2018-08-14: 300 mg via ORAL

## 2018-08-14 MED ORDER — FENTANYL CITRATE (PF) 100 MCG/2ML IJ SOLN
INTRAMUSCULAR | Status: AC
  Filled 2018-08-14: qty 2

## 2018-08-14 MED ORDER — LIDOCAINE HCL (PF) 1 % IJ SOLN
INTRAMUSCULAR | Status: DC | PRN
  Administered 2018-08-14: 30 mL

## 2018-08-14 MED ORDER — MIDAZOLAM HCL 5 MG/5ML IJ SOLN
INTRAMUSCULAR | Status: DC | PRN
  Administered 2018-08-14: 2 mg via INTRAVENOUS

## 2018-08-14 SURGICAL SUPPLY — 34 items
BENZOIN TINCTURE PRP APPL 2/3 (GAUZE/BANDAGES/DRESSINGS) ×3 IMPLANT
BLADE SURG 15 STRL LF DISP TIS (BLADE) ×1 IMPLANT
BLADE SURG 15 STRL SS (BLADE) ×2
CHLORAPREP W/TINT 26ML (MISCELLANEOUS) ×3 IMPLANT
CLOSURE WOUND 1/2 X4 (GAUZE/BANDAGES/DRESSINGS) ×1
COVER SURGICAL LIGHT HANDLE (MISCELLANEOUS) ×3 IMPLANT
DECANTER SPIKE VIAL GLASS SM (MISCELLANEOUS) ×3 IMPLANT
DERMABOND ADVANCED (GAUZE/BANDAGES/DRESSINGS) ×2
DERMABOND ADVANCED .7 DNX12 (GAUZE/BANDAGES/DRESSINGS) ×1 IMPLANT
DRAPE LAPAROTOMY TRNSV 102X78 (DRAPE) IMPLANT
ELECT COATED BLADE 2.86 ST (ELECTRODE) IMPLANT
ELECT PENCIL ROCKER SW 15FT (MISCELLANEOUS) ×3 IMPLANT
ELECT REM PT RETURN 15FT ADLT (MISCELLANEOUS) ×3 IMPLANT
GAUZE 4X4 16PLY RFD (DISPOSABLE) IMPLANT
GAUZE SPONGE 4X4 12PLY STRL (GAUZE/BANDAGES/DRESSINGS) ×3 IMPLANT
GLOVE BIO SURGEON STRL SZ 6 (GLOVE) ×3 IMPLANT
GLOVE BIOGEL PI IND STRL 6.5 (GLOVE) ×1 IMPLANT
GLOVE BIOGEL PI IND STRL 7.0 (GLOVE) ×1 IMPLANT
GLOVE BIOGEL PI INDICATOR 6.5 (GLOVE) ×2
GLOVE BIOGEL PI INDICATOR 7.0 (GLOVE) ×2
GLOVE INDICATOR 6.5 STRL GRN (GLOVE) ×6 IMPLANT
GOWN STRL REUS W/TWL 2XL LVL3 (GOWN DISPOSABLE) ×3 IMPLANT
GOWN STRL REUS W/TWL XL LVL3 (GOWN DISPOSABLE) ×9 IMPLANT
KIT BASIN OR (CUSTOM PROCEDURE TRAY) ×3 IMPLANT
MARKER SKIN DUAL TIP RULER LAB (MISCELLANEOUS) ×3 IMPLANT
NEEDLE HYPO 22GX1.5 SAFETY (NEEDLE) ×3 IMPLANT
NEEDLE HYPO 25X1 1.5 SAFETY (NEEDLE) ×3 IMPLANT
PACK BASIC VI WITH GOWN DISP (CUSTOM PROCEDURE TRAY) ×3 IMPLANT
STRIP CLOSURE SKIN 1/2X4 (GAUZE/BANDAGES/DRESSINGS) ×2 IMPLANT
SUT MNCRL AB 4-0 PS2 18 (SUTURE) ×3 IMPLANT
SUT VIC AB 3-0 SH 27 (SUTURE) ×2
SUT VIC AB 3-0 SH 27X BRD (SUTURE) ×1 IMPLANT
SYR CONTROL 10ML LL (SYRINGE) ×3 IMPLANT
TOWEL OR 17X26 10 PK STRL BLUE (TOWEL DISPOSABLE) ×3 IMPLANT

## 2018-08-14 NOTE — Transfer of Care (Signed)
Immediate Anesthesia Transfer of Care Note  Patient: Patricia Hudson  Procedure(s) Performed: Procedure(s): REMOVAL PORT-A-CATH ERAS PATHWAY (N/A)  Patient Location: PACU  Anesthesia Type:MAC  Level of Consciousness:  sedated, patient cooperative and responds to stimulation  Airway & Oxygen Therapy:Patient Spontanous Breathing and Patient connected to face mask oxgen  Post-op Assessment:  Report given to PACU RN and Post -op Vital signs reviewed and stable  Post vital signs:  Reviewed and stable  Last Vitals:  Vitals:   08/14/18 0650  BP: 120/77  Pulse: 81  Resp: 16  Temp: 36.8 C  SpO2: 97%    Complications: No apparent anesthesia complications

## 2018-08-14 NOTE — Anesthesia Preprocedure Evaluation (Addendum)
Anesthesia Evaluation  Patient identified by MRN, date of birth, ID band Patient awake    Reviewed: Allergy & Precautions, NPO status , Patient's Chart, lab work & pertinent test results  Airway Mallampati: I  TM Distance: >3 FB Neck ROM: Full    Dental  (+) Teeth Intact, Dental Advisory Given   Pulmonary neg pulmonary ROS,    Pulmonary exam normal breath sounds clear to auscultation       Cardiovascular negative cardio ROS Normal cardiovascular exam Rhythm:Regular Rate:Normal     Neuro/Psych negative neurological ROS     GI/Hepatic Neg liver ROS, history of colon cancer   Endo/Other  negative endocrine ROS  Renal/GU negative Renal ROS     Musculoskeletal negative musculoskeletal ROS (+)   Abdominal   Peds  Hematology negative hematology ROS (+)   Anesthesia Other Findings Day of surgery medications reviewed with the patient.  Reproductive/Obstetrics negative OB ROS                            Anesthesia Physical Anesthesia Plan  ASA: II  Anesthesia Plan: MAC   Post-op Pain Management:    Induction: Intravenous  PONV Risk Score and Plan: 2 and Propofol infusion and Treatment may vary due to age or medical condition  Airway Management Planned: Nasal Cannula and Natural Airway  Additional Equipment:   Intra-op Plan:   Post-operative Plan:   Informed Consent: I have reviewed the patients History and Physical, chart, labs and discussed the procedure including the risks, benefits and alternatives for the proposed anesthesia with the patient or authorized representative who has indicated his/her understanding and acceptance.   Dental advisory given  Plan Discussed with: CRNA and Anesthesiologist  Anesthesia Plan Comments:        Anesthesia Quick Evaluation

## 2018-08-14 NOTE — Anesthesia Postprocedure Evaluation (Signed)
Anesthesia Post Note  Patient: Patricia Hudson  Procedure(s) Performed: REMOVAL PORT-A-CATH ERAS PATHWAY (N/A )     Patient location during evaluation: PACU Anesthesia Type: MAC Level of consciousness: awake and alert, awake and oriented Pain management: pain level controlled Vital Signs Assessment: post-procedure vital signs reviewed and stable Respiratory status: spontaneous breathing, nonlabored ventilation and respiratory function stable Cardiovascular status: stable and blood pressure returned to baseline Postop Assessment: no apparent nausea or vomiting Anesthetic complications: no    Last Vitals:  Vitals:   08/14/18 0945 08/14/18 0954  BP: 100/67 106/84  Pulse: 66 68  Resp: 17   Temp: 36.5 C 36.6 C  SpO2: 100% 100%    Last Pain:  Vitals:   08/14/18 0954  TempSrc:   PainSc: 0-No pain                 Catalina Gravel

## 2018-08-14 NOTE — Discharge Instructions (Signed)
[place] Office Phone Number [phone]   POST OP INSTRUCTIONS  Always review your discharge instruction sheet given to you by the facility where your surgery was performed.  IF YOU HAVE DISABILITY OR FAMILY LEAVE FORMS, YOU MUST BRING THEM TO THE OFFICE FOR PROCESSING.  DO NOT GIVE THEM TO YOUR DOCTOR.  1. A prescription for pain medication may be given to you upon discharge.  Take your pain medication as prescribed, if needed.  If narcotic pain medicine is not needed, then you may take acetaminophen (Tylenol) or ibuprofen (Advil) as needed. 2. Take your usually prescribed medications unless otherwise directed 3. If you need a refill on your pain medication, please contact your pharmacy.  They will contact our office to request authorization.  Prescriptions will not be filled after 5pm or on week-ends. 4. You should eat very light the first 24 hours after surgery, such as soup, crackers, pudding, etc.  Resume your normal diet the day after surgery 5. It is common to experience some constipation if taking pain medication after surgery.  Increasing fluid intake and taking a stool softener will usually help or prevent this problem from occurring.  A mild laxative (Milk of Magnesia or Miralax) should be taken according to package directions if there are no bowel movements after 48 hours. 6. You may shower in 48 hours.  The surgical glue will flake off in 2-3 weeks.   7. ACTIVITIES:  No strenuous activity or heavy lifting for 1 week.   a. You may drive when you no longer are taking prescription pain medication, you can comfortably wear a seatbelt, and you can safely maneuver your car and apply brakes. b. RETURN TO WORK:  __________as tolerated as long as no lifting or strenuous activity x 1 week._______________ You should see your doctor in the office for a follow-up appointment approximately three-four weeks after your surgery.    WHEN TO CALL YOUR DOCTOR: 1. Fever over  101.0 2. Nausea and/or vomiting. 3. Extreme swelling or bruising. 4. Continued bleeding from incision. 5. Increased pain, redness, or drainage from the incision.  The clinic staff is available to answer your questions during regular business hours.  Please dont hesitate to call and ask to speak to one of the nurses for clinical concerns.  If you have a medical emergency, go to the nearest emergency room or call 911.  A surgeon from [facility] is always on call at the hospital.  For further questions, please visit [email or web address]

## 2018-08-14 NOTE — Progress Notes (Signed)
PACU note---- pt voiced understanding d/c instructions, tolerated fluids without nausea, mother here to drive pt home

## 2018-08-14 NOTE — H&P (Signed)
Patricia Hudson is an 52 y.o. female.   Chief Complaint: H/o stage IV colon cancer HPI:  Pt is a 52 yo F s/p lap right colectomy and resection of liver metastasis 2 years ago.  She is NED and desires port removal.  Past Medical History:  Diagnosis Date  . Cancer [facility]) colon cancer 2017   liver mass  . Closed displaced fracture of fifth metatarsal bone of right foot 06/28/2017  . Fracture of fourth metatarsal bone 06/28/2017  . Fracture of third metatarsal bone 06/28/2017  . Renal calculi 1994    Past Surgical History:  Procedure Laterality Date  . COLONOSCOPY    . COLONOSCOPY N/A 07/14/2016   Procedure: COLONOSCOPY;  Surgeon: Carol Ada, MD;  Location: [facility] ENDOSCOPY;  Service: Endoscopy;  Laterality: N/A;  . LAPAROSCOPIC LIVER CYST REMOVAL N/A 07/27/2016   Procedure: REMOVAL OF LIVER MASS;  Surgeon: Jackolyn Confer, MD;  Location: [facility] ORS;  Service: General;  Laterality: N/A;  . LAPAROSCOPIC PARTIAL COLECTOMY N/A 07/27/2016   Procedure: LAPAROSCOPIC PARTIAL COLECTOMY MOBILIZATION OF SPLENIC FLEXURE LAPAROSCOPIC SEGMENTAL LIVER RESECTION OF SEGMENT 8 OF LIVER;  Surgeon: Jackolyn Confer, MD;  Location: [facility] ORS;  Service: General;  Laterality: N/A;  . PERCUTANEOUS PINNING Right 06/28/2017   Procedure: RIGHT FOOT 3RD, 4TH, 5TH METATARSAL (TOE) FRACTURE;  Surgeon: Marchia Bond, MD;  Location: [place];  Service: Orthopedics;  Laterality: Right;  . PORTACATH PLACEMENT Right 08/17/2016   Procedure: ULTRASOUND GUIDED INSERTION PORT-A-CATH RIGHT INTERNAL JUGULAR;  Surgeon: Jackolyn Confer, MD;  Location: [place];  Service: General;  Laterality: Right;  . TONSILLECTOMY AND ADENOIDECTOMY  1970's    Family History  Problem Relation Age of Onset  . Diabetes Father   . Hypertension Father   . Lung cancer Father 84       smoker  . Colon polyps Father        unspecified number  . Breast cancer Mother 30       triple negative.BRCA Neg  . Colon polyps Mother        unspecified number  .  Colon polyps Brother        unspecified number  . Breast cancer Maternal Grandmother        dx late 27s; s/p mastectomy; d. 66y  . Breast cancer Paternal Aunt 6       carcinoma in situ  . Pancreatic cancer Maternal Uncle 101       d. 65y  . Leukemia Other        maternal great uncle (MGM's brother)  . Other Maternal Grandfather        hx of "half of stomach removed" - not aware of cancer  . Parkinson's disease Paternal Grandfather   . Heart attack Paternal Grandfather        d. 62y  . Cancer Other        paternal great aunt (PGM's sister) d. 85s, abdominal/Gyn cancer; lim info   Social History:  reports that she has never smoked. She has never used smokeless tobacco. She reports that she drinks alcohol. She reports that she does not use drugs.  Allergies:  Allergies  Allergen Reactions  . Sulfa Antibiotics Other (See Comments)    Kathreen Cosier Syndrome    No medications prior to admission.    Results for orders placed or performed during the hospital encounter of 08/14/18 (from the past 48 hour(s))  CBC     Status: Abnormal   Collection Time: 08/14/18  7:05 AM  Result Value Ref Range   WBC 3.9 (L) 4.0 - 10.5 K/uL   RBC 4.54 3.87 - 5.11 MIL/uL   Hemoglobin 14.1 12.0 - 15.0 g/dL   HCT 41.5 36.0 - 46.0 %   MCV 91.4 78.0 - 100.0 fL   MCH 31.1 26.0 - 34.0 pg   MCHC 34.0 30.0 - 36.0 g/dL   RDW 12.7 11.5 - 15.5 %   Platelets 166 150 - 400 K/uL    Comment: Performed at [facility], [place] [address] [place], [place] [address]   No results found.  Review of Systems  All other systems reviewed and are negative.   Blood pressure 120/77, pulse 81, temperature 98.2 F (36.8 C), temperature source Oral, resp. rate 16, height '5\' 8"'  (1.727 m), weight 63 kg, last menstrual period 04/20/2013, SpO2 98 %. Physical Exam  Constitutional: She is oriented to person, place, and time. She appears well-developed and well-nourished. No distress.  HENT:  Head:  Normocephalic and atraumatic.  Eyes: Pupils are equal, round, and reactive to light. Conjunctivae are normal. No scleral icterus.  Neck: Neck supple.  Cardiovascular: Normal rate.  Respiratory: Effort normal.  Right port  GI: Soft.  Musculoskeletal: Normal range of motion.  Neurological: She is alert and oriented to person, place, and time.  Skin: Skin is warm and dry. She is not diaphoretic.  Psychiatric: She has a normal mood and affect. Her behavior is normal. Judgment and thought content normal.     Assessment/Plan Stage IV colon cancer, NED Plan port removal Discussed risks and benefits.    Stark Klein, MD 08/14/2018, 8:25 AM

## 2018-08-14 NOTE — Op Note (Signed)
  PRE-OPERATIVE DIAGNOSIS:  un-needed Port-A-Cath for h/o stage IV colon cancer  POST-OPERATIVE DIAGNOSIS:  Same   PROCEDURE:  Procedure(s):  REMOVAL PORT-A-CATH right internal jugular  SURGEON:  Surgeon(s):  Stark Klein, MD  ANESTHESIA:   MAC + local  EBL:   Minimal  SPECIMEN:  None  Complications : none known  Procedure:   Pt was  identified in the holding area and taken to the operating room where she was placed supine on the operating room table.  MAC anesthesia was induced.  The right upper chest was prepped and draped.  The prior incision was anesthetized with local anesthetic.  The incision was opened with a #15 blade.  The subcutaneous tissue was divided with the cautery.  The port was identified and the capsule opened.  The four 2-0 prolene sutures were removed.  The port was then removed and pressure held on the tract.  The catheter appeared intact without evidence of breakage, length was 25 cm.  The wound was inspected for hemostasis, which was achieved with cautery.  The wound was closed with 3-0 vicryl deep dermal interrupted sutures and 4-0 Monocryl running subcuticular suture.  The wound was cleaned, dried, and dressed with dermabond.  The patient was awakened from anesthesia and taken to the PACU in stable condition.  Needle, sponge, and instrument counts are correct.

## 2018-08-15 ENCOUNTER — Encounter (HOSPITAL_COMMUNITY): Payer: Self-pay | Admitting: General Surgery

## 2018-08-29 ENCOUNTER — Telehealth: Payer: Self-pay | Admitting: Obstetrics and Gynecology

## 2018-08-29 NOTE — Telephone Encounter (Signed)
Patient is traveling to [place] next weekend and would like Dr. Talbert Nan to prescribe her atovaquone/proguanil for malaria prevention. She does not have a PCP. Pharmacy is [facility] in [place] ph: [phone].

## 2018-08-29 NOTE — Telephone Encounter (Signed)
Spoke with patient and advised of Travel clinic at [facility] and number given [phone]. She is very pleased with this option for her care.

## 2019-01-17 ENCOUNTER — Other Ambulatory Visit: Payer: Self-pay | Admitting: Obstetrics and Gynecology

## 2019-01-17 ENCOUNTER — Other Ambulatory Visit: Payer: Self-pay | Admitting: Nurse Practitioner

## 2019-01-17 DIAGNOSIS — Z1231 Encounter for screening mammogram for malignant neoplasm of breast: Secondary | ICD-10-CM

## 2019-01-27 ENCOUNTER — Inpatient Hospital Stay: Payer: No Typology Code available for payment source | Attending: Oncology

## 2019-01-27 ENCOUNTER — Other Ambulatory Visit: Payer: No Typology Code available for payment source

## 2019-01-27 ENCOUNTER — Telehealth: Payer: Self-pay | Admitting: Oncology

## 2019-01-27 ENCOUNTER — Ambulatory Visit
Admission: RE | Admit: 2019-01-27 | Discharge: 2019-01-27 | Disposition: A | Discharge status: Home / Self Care | Payer: No Typology Code available for payment source | Source: Ambulatory Visit | Attending: Oncology | Admitting: Oncology

## 2019-01-27 ENCOUNTER — Other Ambulatory Visit: Payer: Self-pay

## 2019-01-27 ENCOUNTER — Inpatient Hospital Stay (HOSPITAL_BASED_OUTPATIENT_CLINIC_OR_DEPARTMENT_OTHER): Payer: No Typology Code available for payment source | Admitting: Oncology

## 2019-01-27 VITALS — BP 133/97 | HR 85 | Temp 98.1°F | Resp 19 | Ht 68.0 in | Wt 131.0 lb

## 2019-01-27 DIAGNOSIS — K625 Hemorrhage of anus and rectum: Secondary | ICD-10-CM | POA: Insufficient documentation

## 2019-01-27 DIAGNOSIS — Z800 Family history of malignant neoplasm of digestive organs: Secondary | ICD-10-CM

## 2019-01-27 DIAGNOSIS — Z803 Family history of malignant neoplasm of breast: Secondary | ICD-10-CM

## 2019-01-27 DIAGNOSIS — C187 Malignant neoplasm of sigmoid colon: Secondary | ICD-10-CM

## 2019-01-27 DIAGNOSIS — Z9221 Personal history of antineoplastic chemotherapy: Secondary | ICD-10-CM | POA: Insufficient documentation

## 2019-01-27 DIAGNOSIS — Z85038 Personal history of other malignant neoplasm of large intestine: Secondary | ICD-10-CM | POA: Insufficient documentation

## 2019-01-27 DIAGNOSIS — D6959 Other secondary thrombocytopenia: Secondary | ICD-10-CM | POA: Diagnosis not present

## 2019-01-27 LAB — BASIC METABOLIC PANEL
ANION GAP: 9 (ref 5–15)
BUN: 8 mg/dL (ref 6–20)
CHLORIDE: 108 mmol/L (ref 98–111)
CO2: 26 mmol/L (ref 22–32)
Calcium: 9.9 mg/dL (ref 8.9–10.3)
Creatinine, Ser: 0.78 mg/dL (ref 0.44–1.00)
Glucose, Bld: 104 mg/dL — ABNORMAL HIGH (ref 70–99)
POTASSIUM: 4.4 mmol/L (ref 3.5–5.1)
Sodium: 143 mmol/L (ref 135–145)

## 2019-01-27 LAB — CEA (IN HOUSE-CHCC): CEA (CHCC-In House): 1.11 ng/mL (ref 0.00–5.00)

## 2019-01-27 MED ORDER — IOPAMIDOL (ISOVUE-300) INJECTION 61%
100.0000 mL | Freq: Once | INTRAVENOUS | Status: AC | PRN
  Administered 2019-01-27: 100 mL via INTRAVENOUS

## 2019-01-27 NOTE — Progress Notes (Signed)
Patricia OFFICE PROGRESS NOTE   Diagnosis: Colon cancer  INTERVAL HISTORY:   Patricia Hudson returns as scheduled.  She feels well.  Good appetite.  She relates weight loss to anxiety and increased work surrounding a job change.  No other complaint.  Objective:  Vital signs in last 24 hours:  Blood pressure (!) 133/97, pulse 85, temperature 98.1 F (36.7 C), resp. rate 19, height '5\' 8"'$  (1.727 m), weight 131 lb (59.4 kg), last menstrual period 04/20/2013, SpO2 100 %.    HEENT: Neck without mass Lymphatics: No cervical, supraclavicular, axillary, or inguinal nodes Resp: Lungs clear bilaterally Cardio: Regular rate and rhythm GI: No hepatosplenomegaly, no mass, nontender Vascular: No leg edema  Skin: Abdominal scar without evidence of recurrent tumor    Lab Results:  Lab Results  Component Value Date   WBC 3.9 (L) 08/14/2018   HGB 14.1 08/14/2018   HCT 41.5 08/14/2018   MCV 91.4 08/14/2018   PLT 166 08/14/2018   NEUTROABS 2.4 04/10/2017    CMP  Lab Results  Component Value Date   NA 143 01/27/2019   K 4.4 01/27/2019   CL 108 01/27/2019   CO2 26 01/27/2019   GLUCOSE 104 (H) 01/27/2019   BUN 8 01/27/2019   CREATININE 0.78 01/27/2019   CALCIUM 9.9 01/27/2019   PROT 6.5 01/30/2017   ALBUMIN 3.7 01/30/2017   AST 27 01/30/2017   ALT 16 01/30/2017   ALKPHOS 126 01/30/2017   BILITOT 0.41 01/30/2017   GFRNONAA >60 01/27/2019   GFRAA >60 01/27/2019    Lab Results  Component Value Date   CEA1 1.08 07/29/2018     Medications: I have reviewed the patient's current medications.   Assessment/Plan: 1. Adenocarcinoma of the sigmoid colon, status post a colonoscopic biopsy 07/07/2016  Mismatch repair protein expression-normal  Sigmoid mass noted at 20 cm from the anal verge  Staging CTs of the chest, abdomen, and pelvis negative for evidence of metastatic disease other than an indeterminate 10 mm right liver lesion  MRI of the abdomen 07/07/2016  revealed multiple bilateral hepatic cyst. 7 mm inferior subcapsular right liver lesion suspicious for a metastasis  PET scan 07/21/2016-hypermetabolic sigmoid colon mass, mildly hypermetabolic segment 6 liver lesion  Laparoscopic assisted rectosigmoid colectomy and segment 8 liver resection 07/27/2016, pathology confirmed a T3, N2b, M1tumor, 11/16 lymph nodes positive for metastatic carcinoma  Foundation 1 testing found the tumor to be microsatellite stable, tumor mutation burden low, no BRAFor RAS mutation  No loss of mismatch repair protein expression, MSI-stable  Cycle 1 CAPOX 08/22/2016  Cycle 2 CAPOX 09/12/2016  Cycle 3 CAPOX 10/03/2016 (Xeloda dose reduced to 3 tablets every morning and 2 tablets every evening beginning with cycle 3)  Cycle 4 CAPOX 10/31/2016 (Xeloda re-escalated to original dose)  Cycle 5 CAPOX 11/21/2016  Cycle 6 CAPOX 12/12/2016  Cycle 7 CAPOX 01/09/2017 (oxaliplatin held)  Cycle 8 CAPOX 01/30/2017 (oxaliplatin dose reduced)  CT scans 02/27/2017-negative for recurrent colon cancer  CTs 08/14/2017-negative for recurrent cancer  CTs 02/12/2018-negative for recurrent cancer  CTs 07/29/2018-negative for recurrent cancer  2. 3 of rectal bleeding secondary to #1  3. Multiple polyps on the colonoscopy 07/07/2016  Admission 07/13/2016 with acute GI bleeding secondary to an ulcer at the cecum  Colonoscopy 10/08/2017-small polyps removed from the cecum-colonic mucosa with lymphoid aggregates-no dysplasia or malignancy  4. Family history of breast and pancreas cancer  5. Delayed nausea following cycle 1 CAPOX-emend added with cycle 2  6. Diarrhea following cycle  2 and cycle 6 CAPOX  7. Neutropenia/thrombocytopenia secondary to chemotherapy  8. Mild oxaliplatin neuropathy- resolved    Disposition: Patricia Hudson is in clinical remission from colon cancer.  We will follow-up on the CT and CEA results from today.  She will return for an  office visit, CEA, and restaging CTs in 6 months.  Betsy Coder, MD  01/27/2019  1:33 PM

## 2019-01-27 NOTE — Telephone Encounter (Signed)
Scheduled appt per 3/9 los. ° °Printed calendar and avs. °

## 2019-01-29 ENCOUNTER — Telehealth: Payer: Self-pay | Admitting: *Deleted

## 2019-01-29 DIAGNOSIS — C187 Malignant neoplasm of sigmoid colon: Secondary | ICD-10-CM

## 2019-01-29 NOTE — Telephone Encounter (Signed)
Notified patient that per Dr. Benay Spice, case was discussed in GI Tumor Board today and consensus was that lung nodules appear benign. Suggesting to do non-contrast CT scan of chest in 3 months. She agrees and wants to have scan at [facility] on [address] location. Order placed.

## 2019-03-03 ENCOUNTER — Ambulatory Visit: Payer: No Typology Code available for payment source

## 2019-04-18 ENCOUNTER — Other Ambulatory Visit: Payer: Self-pay

## 2019-04-18 ENCOUNTER — Ambulatory Visit
Admission: RE | Admit: 2019-04-18 | Discharge: 2019-04-18 | Disposition: A | Discharge status: Home / Self Care | Payer: BLUE CROSS/BLUE SHIELD | Source: Ambulatory Visit | Attending: Obstetrics and Gynecology | Admitting: Obstetrics and Gynecology

## 2019-04-18 ENCOUNTER — Telehealth: Payer: Self-pay | Admitting: Obstetrics and Gynecology

## 2019-04-18 DIAGNOSIS — Z1231 Encounter for screening mammogram for malignant neoplasm of breast: Secondary | ICD-10-CM | POA: Diagnosis not present

## 2019-04-18 NOTE — Telephone Encounter (Signed)
Spoke with patient. Message given as seen below. Patient is agreeable to referral and very grateful.  Routing to provider and will close encounter.

## 2019-04-18 NOTE — Telephone Encounter (Signed)
Please let the patient know that the Genetic counselor calculated her lifetime risk of breast cancer at 37.7%. She should start having yearly MRI's, will refer to the high risk clinic.

## 2019-04-18 NOTE — Telephone Encounter (Signed)
-----   Message from Clarene Essex, Counselor sent at 04/18/2019 12:59 PM EDT ----- Good afternoon,  I just pulled up Ms. Kleiber' testing and notes.  It looks like she was seen by Lonn Georgia at that time, and I am sure she was focusing more on the colon cancer risks than breast cancer based on her personal history.  Sorry, though, that it was not performed.  I performed a TC on her and her breast cancer risk is 37.7%, so a high risk clinic is appropriate. Her genetic testing was a comprehensive test, that included all of the breast and colon cancer genes.  If she came back, we would not do testing again, for breast or colon cancer.  I think she could go straight to the high risk clinic.  It looks like her mammogram was performed at the [facility].  They have recently started a high risk program where they make these referrals.  I am happy to ask for her to be seen in high risk and bypass genetics, if you would like me to.  Just let me know.  Santiago Glad ----- Message ----- From: Salvadore Dom, MD Sent: 04/18/2019  12:41 PM EDT To: Clarene Essex, Counselor  Tobey Bride, Ms Luckman was seen by you in 12/17 for genetic counseling secondary to her diagnosis of colon cancer. She has a family history of breast cancer, which you discussed with her. I don't see that and risk models were done for breast cancer. On her recent mammogram it was recommend to consider genetic counseling and to consider breast MRI's if her risk of breast cancer is >20%. Should I send her back to you for this or was it done and I'm just not seeing it? Thank you for your help!! Sharee Pimple

## 2019-04-28 ENCOUNTER — Other Ambulatory Visit: Payer: Self-pay | Admitting: Oncology

## 2019-04-28 ENCOUNTER — Other Ambulatory Visit: Payer: No Typology Code available for payment source

## 2019-04-28 ENCOUNTER — Telehealth: Payer: Self-pay | Admitting: Oncology

## 2019-04-28 NOTE — Telephone Encounter (Signed)
Pt has been cld and scheduled to see Dr. Jana Hakim for high risk breast clinic on 6/29 at 4pm.

## 2019-04-29 ENCOUNTER — Telehealth: Payer: Self-pay

## 2019-04-29 ENCOUNTER — Ambulatory Visit
Admission: RE | Admit: 2019-04-29 | Discharge: 2019-04-29 | Disposition: A | Discharge status: Home / Self Care | Payer: BLUE CROSS/BLUE SHIELD | Source: Ambulatory Visit | Attending: Oncology | Admitting: Oncology

## 2019-04-29 DIAGNOSIS — C187 Malignant neoplasm of sigmoid colon: Secondary | ICD-10-CM

## 2019-04-29 DIAGNOSIS — R911 Solitary pulmonary nodule: Secondary | ICD-10-CM | POA: Diagnosis not present

## 2019-04-29 NOTE — Telephone Encounter (Signed)
attempted to call patient. Unable to leave voicemail due to voicemail box being full. Will try again later.

## 2019-04-29 NOTE — Telephone Encounter (Signed)
TC to patient per Dr Benay Spice to let her know that the left lung nodule has resolved, no evidence of cancer, f/u as scheduled. Patient verbalized understanding. No further problems or concerns at this time.

## 2019-05-16 ENCOUNTER — Telehealth: Payer: Self-pay

## 2019-05-16 NOTE — Telephone Encounter (Signed)
Called patient to go over pre-screening questions for upcoming appt. Pt voicemail was full and I could not leave message.

## 2019-05-18 NOTE — Progress Notes (Signed)
[place]  Telephone:(336) [phone] Fax:(336) [phone]    ID: Patricia Hudson DOB: Apr 08, 1966  MR#: [id number]  GEX#:[id number]  Patient Care Team: Patient, No Pcp Per as PCP - General (General Practice) , Virgie Dad, MD as Consulting Physician (Oncology) Salvadore Dom, MD as Consulting Physician (Obstetrics and Gynecology) Ladell Pier, MD as Consulting Physician (Oncology) Stark Klein, MD as Consulting Physician (General Surgery) Marchia Bond, MD as Consulting Physician (Orthopedic Surgery) OTHER MD:   CHIEF COMPLAINT: Breast Cancer high risk patient; history of colon cancer  CURRENT TREATMENT: intensified screening   HISTORY OF CURRENT ILLNESS: Patricia Hudson had an abnormal colonoscopy on 07/07/2016; a biopsy of the found mass was taken at that time showing an invasive adenocarcinoma. Then, on laparoscopic partial colectomy and removal of liver mass was performed on 07/27/2016. The pathology [id number]) from this procedure showed a T3, N2b, M1tumor:  1. Colon, segmental resection for tumor, rectosigmoid - invasive adenocarcinoma, well-differentiated, spanning 3.7 cm. - tumor focally invades through muscularis propria into pericolonic tissue. - resection margins are negative. - metastatic carcinoma in eleven of sixteen lymph nodes (11/16). - see oncology table. 2. Colon, resection margin (donut), distal - benign colonic mucosa. - no dysplasia or malignancy. 3. Liver, partial resection, segment 8 - metastatic adenocarcinoma.   - resection margin is negative.  She was treated by Dr. Donneta Romberg with 8 cycles of CAPOX from 08/22/2016 to 01/30/2017. She continues to be followed closely by Dr. Learta Codding and receives 6 month check ups with CTs.   Of concern to [place], and the reason for her referral to me, is her extensive family history of breast and pancreatic cancer.  This is detailed below.  Our genetics counselor Roma Kayser calculated her  lifetime risk of breast cancer at 37.7% using the [place] protocol.  Genetic testing on 10/31/2016 through the Common Hereditary cancer panel found no deleterious mutations in APC, ATM, AXIN2, BARD1, BMPR1A, BRCA1, BRCA2, BRIP1, CDH1, CDKN2A (p14ARF), CDKN2A (p16INK4a), CHEK2, DICER1, EPCAM (Deletion/duplication testing only), GREM1 (promoter region deletion/duplication testing only), KIT, MEN1, MLH1, MSH2, MSH6, MUTYH, NBN, NF1, PALB2, PDGFRA, PMS2, POLD1, POLE, PTEN, RAD50, RAD51C, RAD51D, SDHB, SDHC, SDHD, SMAD4, SMARCA4. STK11, TP53, TSC1, TSC2, and VHL.  The following gene was evaluated for sequence changes only: SDHA and HOXB13 c.251G>A variant only.  Bilateral screening mammography with tomography at the breast center 04/18/2019 found the breast density to be category C.  There were no findings suspicious of malignancy.  The patient's subsequent history is as detailed below.   INTERVAL HISTORY: Patricia Hudson was evaluated in the breast cancer clinic on 05/19/2019.   REVIEW OF SYSTEMS: Patricia Hudson likes to walk or run for exercise; she usually does that about three or four times per week for a few miles. The patient denies unusual headaches, visual changes, nausea, vomiting, stiff neck, dizziness, or gait imbalance. There has been no cough, phlegm production, or pleurisy, no chest pain or pressure, and no change in bowel or bladder habits. The patient denies fever, rash, bleeding, unexplained fatigue or unexplained weight loss. A detailed review of systems was otherwise entirely negative.    PAST MEDICAL HISTORY: Past Medical History:  Diagnosis Date  . Cancer [facility]) colon cancer 2017   liver mass  . Closed displaced fracture of fifth metatarsal bone of right foot 06/28/2017  . Fracture of fourth metatarsal bone 06/28/2017  . Fracture of third metatarsal bone 06/28/2017  . Renal calculi 1994     PAST SURGICAL HISTORY: Past Surgical  History:  Procedure Laterality Date  . COLONOSCOPY    .  COLONOSCOPY N/A 07/14/2016   Procedure: COLONOSCOPY;  Surgeon: Carol Ada, MD;  [facility] ENDOSCOPY;  Service: Endoscopy;  Laterality: N/A;  . LAPAROSCOPIC LIVER CYST REMOVAL N/A 07/27/2016   Procedure: REMOVAL OF LIVER MASS;  Surgeon: Jackolyn Confer, MD;  Location: [facility] ORS;  Service: General;  Laterality: N/A;  . LAPAROSCOPIC PARTIAL COLECTOMY N/A 07/27/2016   Procedure: LAPAROSCOPIC PARTIAL COLECTOMY MOBILIZATION OF SPLENIC FLEXURE LAPAROSCOPIC SEGMENTAL LIVER RESECTION OF SEGMENT 8 OF LIVER;  Surgeon: Jackolyn Confer, MD;  Location: [facility] ORS;  Service: General;  Laterality: N/A;  . PERCUTANEOUS PINNING Right 06/28/2017   Procedure: RIGHT FOOT 3RD, 4TH, 5TH METATARSAL (TOE) FRACTURE;  Surgeon: Marchia Bond, MD;  Location: [place];  Service: Orthopedics;  Laterality: Right;  . PORT-A-CATH REMOVAL N/A 08/14/2018   Procedure: REMOVAL PORT-A-CATH ERAS PATHWAY;  Surgeon: Stark Klein, MD;  Location: [facility] ORS;  Service: General;  Laterality: N/A;  . PORTACATH PLACEMENT Right 08/17/2016   Procedure: ULTRASOUND GUIDED INSERTION PORT-A-CATH RIGHT INTERNAL JUGULAR;  Surgeon: Jackolyn Confer, MD;  Location: [place];  Service: General;  Laterality: Right;  . TONSILLECTOMY AND ADENOIDECTOMY  1970's     FAMILY HISTORY: Family History  Problem Relation Age of Onset  . Diabetes Father   . Hypertension Father   . Lung cancer Father 23       smoker  . Colon polyps Father        unspecified number  . Breast cancer Mother 68       triple negative.BRCA Neg  . Colon polyps Mother        unspecified number  . Colon polyps Brother        unspecified number  . Breast cancer Maternal Grandmother        dx late 73s; s/p mastectomy; d. 66y  . Breast cancer Paternal Aunt 47       carcinoma in situ  . Pancreatic cancer Maternal Uncle 32       d. 65y  . Leukemia Other        maternal great uncle (MGM's brother)  . Other Maternal Grandfather        hx of "half of stomach removed" - not aware  of cancer  . Parkinson's disease Paternal Grandfather   . Heart attack Paternal Grandfather        d. 80y  . Cancer Other        paternal great aunt (PGM's sister) d. 12s, abdominal/Gyn cancer; lim info   The patient's mother was diagnosed with breast cancer at age 109, with negative BRCA testing.  She also has a history of colon polyps.  The patient's mother has 1 brother diagnosed with pancreatic cancer at 22.  Maternal grandmother was diagnosed with breast cancer in her late 65s.  That grandmother had a brother with a history of leukemia.  The patient's father was diagnosed with lung cancer at 17 in the setting of smoking.  He has a history of colon polyps.  His sister, the patient's aunt, had ductal carcinoma in situ diagnosed at age 65.  The patient has a great aunt on the father's side who passed away from gynecologic cancer in her 40s.  The patient has 2 full brothers, the older one of whom had colon polyps.    GYNECOLOGIC HISTORY:  Patient's last menstrual period was 04/20/2013. 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 Her husband, Patricia Hudson, is a family physician. Their 13 year old son, Patricia Hudson, will graduate from high school in 03/2020; he is interested in science. The patient attends the [employer].   ADVANCED DIRECTIVES: In the absence of any documentation, Patricia Hudson's spouse, Patricia Hudson, is automatically her healthcare power of attorney.      HEALTH MAINTENANCE: Social History   Tobacco Use  . Smoking status: Never Smoker  . Smokeless tobacco: Never Used  Substance Use Topics  . Alcohol use: Yes    Comment: 1 a month  . Drug use: No    Colonoscopy: 10/08/2017  PAP: up to date  Bone density: none  Mammography: 04/18/2019  Allergies  Allergen Reactions  . Sulfa Antibiotics Other (See Comments)    Kathreen Cosier Syndrome    No current outpatient medications on file.   No current facility-administered medications for this visit.      OBJECTIVE: Middle-aged white woman who appears younger than stated age  47:   05/19/19 1620  BP: 123/70  Pulse: 86  Resp: 18  Temp: 98.9 F (37.2 C)  SpO2: 98%     Body mass index is 21.55 kg/m.   Wt Readings from Last 3 Encounters:  05/19/19 141 lb 11.2 oz (64.3 kg)  01/27/19 131 lb (59.4 kg)  08/14/18 139 lb (63 kg)      ECOG FS:0 - Asymptomatic  Ocular: Sclerae unicteric, pupils round and equal Ear-nose-throat: Oropharynx clear and moist Lymphatic: No cervical or supraclavicular adenopathy Lungs no rales or rhonchi Heart regular rate and rhythm Abd soft, nontender, positive bowel sounds MSK no focal spinal tenderness, no joint edema Neuro: non-focal, well-oriented, positive affect Breasts: I do not palpate a mass in either breast.  There are no skin or nipple changes of concern.  Both axillae are benign.   LAB RESULTS:  CMP     Component Value Date/Time   NA 143 01/27/2019 0901   NA 140 08/13/2017 1541   K 4.4 01/27/2019 0901   K 4.1 08/13/2017 1541   CL 108 01/27/2019 0901   CO2 26 01/27/2019 0901   CO2 24 08/13/2017 1541   GLUCOSE 104 (H) 01/27/2019 0901   GLUCOSE 95 08/13/2017 1541   BUN 8 01/27/2019 0901   BUN 17.4 08/13/2017 1541   CREATININE 0.78 01/27/2019 0901   CREATININE 0.80 07/29/2018 0857   CREATININE 0.8 08/13/2017 1541   CALCIUM 9.9 01/27/2019 0901   CALCIUM 9.8 08/13/2017 1541   PROT 6.5 01/30/2017 0952   ALBUMIN 3.7 01/30/2017 0952   AST 27 01/30/2017 0952   ALT 16 01/30/2017 0952   ALKPHOS 126 01/30/2017 0952   BILITOT 0.41 01/30/2017 0952   GFRNONAA >60 01/27/2019 0901   GFRNONAA >60 07/29/2018 0857   GFRAA >60 01/27/2019 0901   GFRAA >60 07/29/2018 0857    No results found for:  TOTALPROTELP, ALBUMINELP, A1GS, A2GS, BETS, BETA2SER, GAMS, MSPIKE, SPEI  No results found for: KPAFRELGTCHN, LAMBDASER, KAPLAMBRATIO  Lab Results  Component Value Date   WBC 3.9 (L) 08/14/2018   NEUTROABS 2.4 04/10/2017   HGB 14.1 08/14/2018   HCT 41.5 08/14/2018   MCV 91.4 08/14/2018   PLT 166 08/14/2018    _0 @  No results found for: LABCA2  No components found for: [id number]  No  results for input(s): INR in the last 168 hours.  No results found for: LABCA2  No results found for: CAN199  No results found for: WGY659  No results found for: DJT701  No results found for: CA2729  No components found for: HGQUANT  Lab Results  Component Value Date   CEA1 1.11 01/27/2019   /  CEA ([facility]In [facility])  Date Value Ref Range Status  01/27/2019 1.11 0.00 - 5.00 ng/mL Final    Comment:    (NOTE) This test was performed using Architect's Chemiluminescent Microparticle Immunoassay. Values obtained from different assay methods cannot be used interchangeably. Please note that 5-10% of patients who smoke may see CEA levels up to 6.9 ng/mL. Performed at [facility], [place] [address], [place], [place] [address]      No results found for: AFPTUMOR  No results found for: [place]  No results found for: PSA1  No visits with results within 3 Day(s) from this visit.  Latest known visit with results is:  Appointment on 01/27/2019  Component Date Value Ref Range Status  . CEA ([facility]) 01/27/2019 1.11  0.00 - 5.00 ng/mL Final   Comment: (NOTE) This test was performed using Architect's Chemiluminescent Microparticle Immunoassay. Values obtained from different assay methods cannot be used interchangeably. Please note that 5-10% of patients who smoke may see CEA levels up to 6.9 ng/mL. Performed at [facility], [place] [address], [place], [place] [address]   . Sodium 01/27/2019 143  135 - 145 mmol/L Final  .  Potassium 01/27/2019 4.4  3.5 - 5.1 mmol/L Final  . Chloride 01/27/2019 108  98 - 111 mmol/L Final  . CO2 01/27/2019 26  22 - 32 mmol/L Final  . Glucose, Bld 01/27/2019 104* 70 - 99 mg/dL Final  . BUN 01/27/2019 8  6 - 20 mg/dL Final  . Creatinine, Ser 01/27/2019 0.78  0.44 - 1.00 mg/dL Final  . Calcium 01/27/2019 9.9  8.9 - 10.3 mg/dL Final  . GFR calc non Af Amer 01/27/2019 >60  >60 mL/min Final  . GFR calc Af Amer 01/27/2019 >60  >60 mL/min Final  . Anion gap 01/27/2019 9  5 - 15 Final   Performed at [facility], [place] [address] [place], [place] [address]    (this displays the last labs from the last 3 days)  No results found for: TOTALPROTELP, ALBUMINELP, A1GS, A2GS, BETS, BETA2SER, GAMS, MSPIKE, SPEI (this displays SPEP labs)  No results found for: KPAFRELGTCHN, LAMBDASER, KAPLAMBRATIO (kappa/lambda light chains)  No results found for: HGBA, HGBA2QUANT, HGBFQUANT, HGBSQUAN (Hemoglobinopathy evaluation)   No results found for: LDH  No results found for: IRON, TIBC, IRONPCTSAT (Iron and TIBC)  No results found for: FERRITIN  Urinalysis    Component Value Date/Time   BILIRUBINUR neg 09/17/2014 1101   PROTEINUR neg 09/17/2014 1101   UROBILINOGEN negative 09/17/2014 1101   NITRITE neg 09/17/2014 1101   LEUKOCYTESUR Trace 09/17/2014 1101     STUDIES:  Ct Chest Wo Contrast  Result Date: 04/29/2019 CLINICAL DATA:  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 Follow-up pulmonary nodule. EXAM: CT CHEST WITHOUT CONTRAST TECHNIQUE: Multidetector CT imaging of the chest was performed following the standard protocol without IV contrast. COMPARISON:  01/27/2019 CT chest, abdomen and pelvis. FINDINGS: Cardiovascular: Normal heart size. No significant pericardial effusion/thickening. Great vessels are normal in course and caliber. Mediastinum/Nodes: No discrete thyroid nodules. Unremarkable  esophagus.  No pathologically enlarged axillary, mediastinal or hilar lymph nodes, noting limited sensitivity for the detection of hilar adenopathy on this noncontrast study. Lungs/Pleura: No pneumothorax. No pleural effusion. No acute consolidative airspace disease, lung masses or significant pulmonary nodules. Previously described 8 mm posterior left lower lobe pulmonary nodule has resolved. Upper abdomen: Stable partially visualized postsurgical changes from resection posterior inferior right liver lobe. Stable 1.6 cm posterior right liver lobe simple cyst. Additional scattered subcentimeter hypodense left liver lobe lesions are too small to characterize and are stable, presumably benign. Musculoskeletal: No aggressive appearing focal osseous lesions. Minimal thoracic spondylosis. IMPRESSION: No evidence of metastatic disease in the chest. Previously described 8 mm left lower lobe subpleural pulmonary nodule has resolved. Electronically Signed   By: Ilona Sorrel M.D.   On: 04/29/2019 11:23     ELIGIBLE FOR AVAILABLE RESEARCH PROTOCOL: no   ASSESSMENT: 53 y.o. Patricia Hudson, [place] woman at high risk for breast cancer, also with a history of colon cancer as follows  1.  Sigmoid: Biopsy 07/07/2016 confirmed adenocarcinoma  (a) staging studies showed a single metastatic liver lesion  2.  Status post rectosigmoid colectomy and segment 8 liver resection 07/27/2016 for a pT3 pN2b M1 tumor  (a) foundation one testing found the tumor to be microsatellite stable, low tumor mutation burden, no BRAF or RAS mutation, no loss of mismatch repair expression  3. treated with adjuvant CAPOX between October 2017 and March 2018  (a) colonoscopy 10/08/2017 showed 3 small sessile polyps; repeat 2021 planned  (b) chest CT scan 04/29/2019 shows no evidence of disease recurrence   4. Genetic testing on 10/31/2016 through the Common Hereditary cancer panel found no deleterious mutations in APC, ATM, AXIN2, BARD1, BMPR1A,  BRCA1, BRCA2, BRIP1, CDH1, CDKN2A (p14ARF), CDKN2A (p16INK4a), CHEK2, DICER1, EPCAM (Deletion/duplication testing only), GREM1 (promoter region deletion/duplication testing only), KIT, MEN1, MLH1, MSH2, MSH6, MUTYH, NBN, NF1, PALB2, PDGFRA, PMS2, POLD1, POLE, PTEN, RAD50, RAD51C, RAD51D, SDHB, SDHC, SDHD, SMAD4, SMARCA4. STK11, TP53, TSC1, TSC2, and VHL.   (a) The following gene was evaluated for sequence changes only: SDHA and HOXB13 c.251G>A variant only.   5. Breast Cancer high risk: 25 - 37.7% lifetime risk of breast cancer  6. Breast cancer risk reduction: continue/optimize exercise and diet   (a) patient not motivated to take anti-estrogens  (b) bilateral mastectomies not recommended  7. Intensified screening:  (a) yearly mammography with tomography, alternating every 6 months with  (b) yearly breast MRI  (c) biannual MD breast exam   PLAN: I spent approximately 50 minutes face to face with Patricia Hudson with more than 50% of that time spent in counseling and coordination of care. Specifically we reviewed the biology of the patient's diagnosis and the specifics of her situation.  Lamyah understands her lifetime risk of developing breast cancer is 2-3 times that of the average woman her age.   There are 2 ways of dealing with this problem.  One is risk reduction.  The other one is intensified screening.  One way to reduce the risk is bilateral mastectomies.  We discussed this only for the sake of completeness. Bilateral mastectomies do not entirely eliminate the risk of breast cancer but would reduce it to less than 5% over the patient's lifetime. It does not improve survival in this setting. It is not recommended.  Another approach to risk reduction is antiestrogens.  Aromatase inhibitors [anastrozole, letrozole and exemestane] are not a choice in pre-menopausal patients. Tamoxifen or raloxefene can be used in pre- or post-menopausal patients.  We discussed these agents in detail including their  possible toxicities, side effects and complications.  We also discussed the fact that either of these classes of drugs taken for 5 years would cut her risk of breast cancer in half.   Kyna is not motivated to take antiestrogens, partly because of the side effects that may develop, but also because her mother had triple negative breast cancer and the risk of that specifically of course would not be reduced by either of these agents.  A third way to reduce her breast cancer risk is to optimize her diet and exercise routine.  Ideally she would be on a mostly vegetable diet with limited carbohydrates. Meats and dairy are allowed. The exercise goal is 45 minutes 5 times a week of activity vigorous enough to induce perspiration.  Recent study found that women following this strategy have a risk reduction in the 10% range.  After discussing the options for risk reduction, we discussed intensified screening.  This would include yearly mammography with tomography, biannual breast exams by an MD, and a yearly breast MRI.  Patricia Hudson understands that adding the MRI may increase cost and also put her at risk for false positives, which may lead to unnecessary procedures.  The point of course would be to find any cancer that may develop at the very earliest stages to increase the chance of cure with minimal interventions (specifically without chemotherapy).  Patricia Hudson is very attracted to this possibility.  I am going to go ahead and set her up for breast MRI this coming November.  I will ask Dr. Talbert Nan if she would be willing to see Patricia Hudson a short time after the MRI and again in a short time after her mammography in May.  That way the patient could have biannual breast exams by the same physician who would get to know her breasts particularly well.    Patricia Hudson has a good understanding of the overall plan. She agrees with it. She knows the goal of treatment in her case is prevention.  On the assumption that the plan above can go  forward, I have not made her a return appointment with me but I will be glad to see her again at any point in the future if and when the need arises.   Magrinat, Virgie Dad, MD  05/19/19 4:51 PM Medical Oncology and Hematology [facility] [address] [place], [place] [address] Tel. [phone]    Fax. [phone]   I, Jacqualyn Posey am acting as a Education administrator for Chauncey Cruel, MD.   I, Lurline Del MD, have reviewed the above documentation for accuracy and completeness, and I agree with the above.

## 2019-05-19 ENCOUNTER — Other Ambulatory Visit: Payer: Self-pay | Admitting: *Deleted

## 2019-05-19 ENCOUNTER — Inpatient Hospital Stay: Payer: BC Managed Care – PPO | Attending: Oncology | Admitting: Oncology

## 2019-05-19 ENCOUNTER — Other Ambulatory Visit: Payer: Self-pay

## 2019-05-19 ENCOUNTER — Encounter: Payer: Self-pay | Admitting: Oncology

## 2019-05-19 VITALS — BP 123/70 | HR 86 | Temp 98.9°F | Resp 18 | Ht 68.0 in | Wt 141.7 lb

## 2019-05-19 DIAGNOSIS — Z85038 Personal history of other malignant neoplasm of large intestine: Secondary | ICD-10-CM | POA: Diagnosis not present

## 2019-05-19 DIAGNOSIS — Z9049 Acquired absence of other specified parts of digestive tract: Secondary | ICD-10-CM

## 2019-05-19 DIAGNOSIS — C187 Malignant neoplasm of sigmoid colon: Secondary | ICD-10-CM

## 2019-05-19 DIAGNOSIS — Z9189 Other specified personal risk factors, not elsewhere classified: Secondary | ICD-10-CM | POA: Insufficient documentation

## 2019-05-19 DIAGNOSIS — Z1239 Encounter for other screening for malignant neoplasm of breast: Secondary | ICD-10-CM | POA: Insufficient documentation

## 2019-05-19 DIAGNOSIS — Z9221 Personal history of antineoplastic chemotherapy: Secondary | ICD-10-CM | POA: Insufficient documentation

## 2019-05-19 DIAGNOSIS — Z800 Family history of malignant neoplasm of digestive organs: Secondary | ICD-10-CM | POA: Diagnosis not present

## 2019-05-19 DIAGNOSIS — Z803 Family history of malignant neoplasm of breast: Secondary | ICD-10-CM | POA: Insufficient documentation

## 2019-07-04 DIAGNOSIS — H6122 Impacted cerumen, left ear: Secondary | ICD-10-CM | POA: Diagnosis not present

## 2019-07-04 DIAGNOSIS — H9202 Otalgia, left ear: Secondary | ICD-10-CM | POA: Diagnosis not present

## 2019-07-07 ENCOUNTER — Other Ambulatory Visit: Payer: Self-pay

## 2019-07-08 NOTE — Progress Notes (Signed)
53 y.o. G26P1001 Married White or Caucasian Not Hispanic or Latino female here for annual exam. No c/o. No vaginal bleeding. Infrequently sexually active.  Son is a Equities trader in [employer]. She is considering fostering a teen after he goes to college.   H/O metastatic colon cancer in 9/17, s/p surgery and chemo. Recent normal CT scan.   Elevated risk of breast cancer of 37.7%, seen at high risk breast clinic.     Patient's last menstrual period was 04/20/2013.          Sexually active: Yes.    The current method of family planning is post menopausal status.    Exercising: Yes.    walking, biking Smoker:  no  Health Maintenance: Pap:  05/30/2016 negative, negative HR HPV History of abnormal Pap:  no MMG:  04/10/2019 BI-RADS CATEGORY 1: Negative, MRI in 11/20 Colonoscopy:  10/08/2017, f/u in 3 years  BMD:   none TDaP:  06/01/2017 Gardasil: none    reports that she has never smoked. She has never used smokeless tobacco. She reports current alcohol use. She reports that she does not use drugs. 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   Past Medical History:  Diagnosis Date  . Cancer [facility]) colon cancer 2017   liver mass  . Closed displaced fracture of fifth metatarsal bone of right foot 06/28/2017  . Fracture of fourth metatarsal bone 06/28/2017  . Fracture of third metatarsal bone 06/28/2017  . Renal calculi 1994    Past Surgical History:  Procedure Laterality Date  . COLONOSCOPY    . COLONOSCOPY N/A 07/14/2016   Procedure: COLONOSCOPY;  Surgeon: Carol Ada, MD;  Location: [facility] ENDOSCOPY;  Service: Endoscopy;  Laterality: N/A;  . LAPAROSCOPIC LIVER CYST REMOVAL N/A 07/27/2016   Procedure: REMOVAL OF LIVER MASS;  Surgeon: Jackolyn Confer, MD;  Location: [facility] ORS;  Service: General;  Laterality: N/A;  . LAPAROSCOPIC PARTIAL COLECTOMY N/A 07/27/2016   Procedure: LAPAROSCOPIC PARTIAL COLECTOMY MOBILIZATION OF SPLENIC FLEXURE LAPAROSCOPIC SEGMENTAL  LIVER RESECTION OF SEGMENT 8 OF LIVER;  Surgeon: Jackolyn Confer, MD;  Location: [facility] ORS;  Service: General;  Laterality: N/A;  . PERCUTANEOUS PINNING Right 06/28/2017   Procedure: RIGHT FOOT 3RD, 4TH, 5TH METATARSAL (TOE) FRACTURE;  Surgeon: Marchia Bond, MD;  Location: [place];  Service: Orthopedics;  Laterality: Right;  . PORT-A-CATH REMOVAL N/A 08/14/2018   Procedure: REMOVAL PORT-A-CATH ERAS PATHWAY;  Surgeon: Stark Klein, MD;  Location: [facility] ORS;  Service: General;  Laterality: N/A;  . PORTACATH PLACEMENT Right 08/17/2016   Procedure: ULTRASOUND GUIDED INSERTION PORT-A-CATH RIGHT INTERNAL JUGULAR;  Surgeon: Jackolyn Confer, MD;  Location: [place];  Service: General;  Laterality: Right;  . TONSILLECTOMY AND ADENOIDECTOMY  1970's    No current outpatient medications on file.   No current facility-administered medications for this visit.     Family History  Problem Relation Age of Onset  . Diabetes Father   . Hypertension Father   . Lung cancer Father 71       smoker  . Colon polyps Father        unspecified number  . Breast cancer Mother 49       triple negative.BRCA Neg  . Colon polyps Mother        unspecified number  . Colon polyps Brother        unspecified number  . Breast cancer Maternal Grandmother        dx  late 57s; s/p mastectomy; d. 66y  . Breast cancer Paternal Aunt 11       carcinoma in situ  . Pancreatic cancer Maternal Uncle 40       d. 65y  . Leukemia Other        maternal great uncle (MGM's brother)  . Other Maternal Grandfather        hx of "half of stomach removed" - not aware of cancer  . Parkinson's disease Paternal Grandfather   . Heart attack Paternal Grandfather        d. 31y  . Cancer Other        paternal great aunt (PGM's sister) d. 52s, abdominal/Gyn cancer; lim info    Review of Systems  Constitutional: Negative.   HENT: Negative.   Eyes: Negative.   Respiratory: Negative.   Cardiovascular: Negative.    Gastrointestinal: Negative.   Endocrine: Negative.   Genitourinary: Negative.   Musculoskeletal: Negative.   Skin: Negative.   Allergic/Immunologic: Negative.   Neurological: Negative.   Hematological: Negative.   Psychiatric/Behavioral: Negative.     Exam:   BP 110/72 (BP Location: Right Arm, Patient Position: Sitting, Cuff Size: Normal)   Pulse 76   Temp 97.7 F (36.5 C) (Skin)   Ht 5' 8.11" (1.73 m)   Wt 140 lb (63.5 kg)   LMP 04/20/2013   BMI 21.22 kg/m   Weight change: '@WEIGHTCHANGE'$ @ Height:   Height: 5' 8.11" (173 cm)  Ht Readings from Last 3 Encounters:  07/09/19 5' 8.11" (1.73 m)  05/19/19 '5\' 8"'$  (1.727 m)  01/27/19 '5\' 8"'$  (1.727 m)    General appearance: alert, cooperative and appears stated age Head: Normocephalic, without obvious abnormality, atraumatic Neck: no adenopathy, supple, symmetrical, trachea midline and thyroid normal to inspection and palpation Lungs: clear to auscultation bilaterally Cardiovascular: regular rate and rhythm Breasts: normal appearance, no masses or tenderness Abdomen: soft, non-tender; non distended,  no masses,  no organomegaly Extremities: extremities normal, atraumatic, no cyanosis or edema Skin: Skin color, texture, turgor normal. No rashes or lesions Lymph nodes: Cervical, supraclavicular, and axillary nodes normal. No abnormal inguinal nodes palpated Neurologic: Grossly normal   Pelvic: External genitalia:  no lesions              Urethra:  normal appearing urethra with no masses, tenderness or lesions              Bartholins and Skenes: normal                 Vagina: normal appearing vagina with normal color and discharge, no lesions              Cervix: no lesions               Bimanual Exam:  Uterus:  normal size, contour, position, consistency, mobility, non-tender              Adnexa: no mass, fullness, tenderness               Rectovaginal: Confirms               Anus:  normal sphincter tone, no lesions  Chaperone  was present for exam.  A:  Well Woman with normal exam  H/O Stage 4 colon cancer, in remission  High risk of breast cancer  P:   No pap this year  Mammogram UTD  Breast MRI in 11/20, followed at high risk breast clinic  Colonoscopy UTD  Discussed breast self exam  Discussed calcium and vit D intake  She will do her screening labs with Oncology

## 2019-07-09 ENCOUNTER — Encounter: Payer: Self-pay | Admitting: Obstetrics and Gynecology

## 2019-07-09 ENCOUNTER — Ambulatory Visit (INDEPENDENT_AMBULATORY_CARE_PROVIDER_SITE_OTHER): Payer: BC Managed Care – PPO | Admitting: Obstetrics and Gynecology

## 2019-07-09 ENCOUNTER — Other Ambulatory Visit: Payer: Self-pay

## 2019-07-09 VITALS — BP 110/72 | HR 76 | Temp 97.7°F | Ht 68.11 in | Wt 140.0 lb

## 2019-07-09 DIAGNOSIS — C189 Malignant neoplasm of colon, unspecified: Secondary | ICD-10-CM

## 2019-07-09 DIAGNOSIS — Z9189 Other specified personal risk factors, not elsewhere classified: Secondary | ICD-10-CM

## 2019-07-09 DIAGNOSIS — Z01419 Encounter for gynecological examination (general) (routine) without abnormal findings: Secondary | ICD-10-CM

## 2019-07-09 NOTE — Patient Instructions (Signed)
EXERCISE AND DIET:  We recommended that you start or continue a regular exercise program for good health. Regular exercise means any activity that makes your heart beat faster and makes you sweat.  We recommend exercising at least 30 minutes per day at least 3 days a week, preferably 4 or 5.  We also recommend a diet low in fat and sugar.  Inactivity, poor dietary choices and obesity can cause diabetes, heart attack, stroke, and kidney damage, among others.    ALCOHOL AND SMOKING:  Women should limit their alcohol intake to no more than 7 drinks/beers/glasses of wine (combined, not each!) per week. Moderation of alcohol intake to this level decreases your risk of breast cancer and liver damage. And of course, no recreational drugs are part of a healthy lifestyle.  And absolutely no smoking or even second hand smoke. Most people know smoking can cause heart and lung diseases, but did you know it also contributes to weakening of your bones? Aging of your skin?  Yellowing of your teeth and nails?  CALCIUM AND VITAMIN D:  Adequate intake of calcium and Vitamin D are recommended.  The recommendations for exact amounts of these supplements seem to change often, but generally speaking 1,200 mg of calcium (between diet and supplement) and 800 units of Vitamin D per day seems prudent. Certain women may benefit from higher intake of Vitamin D.  If you are among these women, your doctor will have told you during your visit.    PAP SMEARS:  Pap smears, to check for cervical cancer or precancers,  have traditionally been done yearly, although recent scientific advances have shown that most women can have pap smears less often.  However, every woman still should have a physical exam from her gynecologist every year. It will include a breast check, inspection of the vulva and vagina to check for abnormal growths or skin changes, a visual exam of the cervix, and then an exam to evaluate the size and shape of the uterus and  ovaries.  And after 51 years of age, a rectal exam is indicated to check for rectal cancers. We will also provide age appropriate advice regarding health maintenance, like when you should have certain vaccines, screening for sexually transmitted diseases, bone density testing, colonoscopy, mammograms, etc.   MAMMOGRAMS:  All women over 40 years old should have a yearly mammogram. Many facilities now offer a "3D" mammogram, which may cost around $50 extra out of pocket. If possible,  we recommend you accept the option to have the 3D mammogram performed.  It both reduces the number of women who will be called back for extra views which then turn out to be normal, and it is better than the routine mammogram at detecting truly abnormal areas.    COLON CANCER SCREENING: Now recommend starting at age 45. At this time colonoscopy is not covered for routine screening until 50. There are take home tests that can be done between 45-49.   COLONOSCOPY:  Colonoscopy to screen for colon cancer is recommended for all women at age 50.  We know, you hate the idea of the prep.  We agree, BUT, having colon cancer and not knowing it is worse!!  Colon cancer so often starts as a polyp that can be seen and removed at colonscopy, which can quite literally save your life!  And if your first colonoscopy is normal and you have no family history of colon cancer, most women don't have to have it again for   10 years.  Once every ten years, you can do something that may end up saving your life, right?  We will be happy to help you get it scheduled when you are ready.  Be sure to check your insurance coverage so you understand how much it will cost.  It may be covered as a preventative service at no cost, but you should check your particular policy.      Breast Self-Awareness Breast self-awareness means being familiar with how your breasts look and feel. It involves checking your breasts regularly and reporting any changes to your  health care provider. Practicing breast self-awareness is important. A change in your breasts can be a sign of a serious medical problem. Being familiar with how your breasts look and feel allows you to find any problems early, when treatment is more likely to be successful. All women should practice breast self-awareness, including women who have had breast implants. How to do a breast self-exam One way to learn what is normal for your breasts and whether your breasts are changing is to do a breast self-exam. To do a breast self-exam: Look for Changes  1. Remove all the clothing above your waist. 2. Stand in front of a mirror in a room with good lighting. 3. Put your hands on your hips. 4. Push your hands firmly downward. 5. Compare your breasts in the mirror. Look for differences between them (asymmetry), such as: ? Differences in shape. ? Differences in size. ? Puckers, dips, and bumps in one breast and not the other. 6. Look at each breast for changes in your skin, such as: ? Redness. ? Scaly areas. 7. Look for changes in your nipples, such as: ? Discharge. ? Bleeding. ? Dimpling. ? Redness. ? A change in position. Feel for Changes Carefully feel your breasts for lumps and changes. It is best to do this while lying on your back on the floor and again while sitting or standing in the shower or tub with soapy water on your skin. Feel each breast in the following way:  Place the arm on the side of the breast you are examining above your head.  Feel your breast with the other hand.  Start in the nipple area and make  inch (2 cm) overlapping circles to feel your breast. Use the pads of your three middle fingers to do this. Apply light pressure, then medium pressure, then firm pressure. The light pressure will allow you to feel the tissue closest to the skin. The medium pressure will allow you to feel the tissue that is a little deeper. The firm pressure will allow you to feel the tissue  close to the ribs.  Continue the overlapping circles, moving downward over the breast until you feel your ribs below your breast.  Move one finger-width toward the center of the body. Continue to use the  inch (2 cm) overlapping circles to feel your breast as you move slowly up toward your collarbone.  Continue the up and down exam using all three pressures until you reach your armpit.  Write Down What You Find  Write down what is normal for each breast and any changes that you find. Keep a written record with breast changes or normal findings for each breast. By writing this information down, you do not need to depend only on memory for size, tenderness, or location. Write down where you are in your menstrual cycle, if you are still menstruating. If you are having trouble noticing differences   in your breasts, do not get discouraged. With time you will become more familiar with the variations in your breasts and more comfortable with the exam. How often should I examine my breasts? Examine your breasts every month. If you are breastfeeding, the best time to examine your breasts is after a feeding or after using a breast pump. If you menstruate, the best time to examine your breasts is 5-7 days after your period is over. During your period, your breasts are lumpier, and it may be more difficult to notice changes. When should I see my health care provider? See your health care provider if you notice:  A change in shape or size of your breasts or nipples.  A change in the skin of your breast or nipples, such as a reddened or scaly area.  Unusual discharge from your nipples.  A lump or thick area that was not there before.  Pain in your breasts.  Anything that concerns you.  

## 2019-07-29 ENCOUNTER — Encounter (HOSPITAL_COMMUNITY): Payer: Self-pay

## 2019-07-29 ENCOUNTER — Inpatient Hospital Stay (HOSPITAL_BASED_OUTPATIENT_CLINIC_OR_DEPARTMENT_OTHER): Payer: BC Managed Care – PPO | Admitting: Oncology

## 2019-07-29 ENCOUNTER — Ambulatory Visit (HOSPITAL_COMMUNITY)
Admission: RE | Admit: 2019-07-29 | Discharge: 2019-07-29 | Disposition: A | Discharge status: Home / Self Care | Payer: BC Managed Care – PPO | Source: Ambulatory Visit | Attending: Oncology | Admitting: Oncology

## 2019-07-29 ENCOUNTER — Telehealth: Payer: Self-pay | Admitting: Oncology

## 2019-07-29 ENCOUNTER — Inpatient Hospital Stay: Payer: BC Managed Care – PPO | Attending: Oncology

## 2019-07-29 ENCOUNTER — Other Ambulatory Visit: Payer: Self-pay

## 2019-07-29 VITALS — BP 138/83 | HR 87 | Temp 98.7°F | Resp 16 | Ht 68.11 in | Wt 140.9 lb

## 2019-07-29 DIAGNOSIS — C187 Malignant neoplasm of sigmoid colon: Secondary | ICD-10-CM | POA: Diagnosis not present

## 2019-07-29 DIAGNOSIS — Z9221 Personal history of antineoplastic chemotherapy: Secondary | ICD-10-CM | POA: Insufficient documentation

## 2019-07-29 DIAGNOSIS — Z800 Family history of malignant neoplasm of digestive organs: Secondary | ICD-10-CM | POA: Diagnosis not present

## 2019-07-29 DIAGNOSIS — Z85038 Personal history of other malignant neoplasm of large intestine: Secondary | ICD-10-CM | POA: Insufficient documentation

## 2019-07-29 DIAGNOSIS — C189 Malignant neoplasm of colon, unspecified: Secondary | ICD-10-CM | POA: Diagnosis not present

## 2019-07-29 DIAGNOSIS — Z803 Family history of malignant neoplasm of breast: Secondary | ICD-10-CM | POA: Insufficient documentation

## 2019-07-29 LAB — CEA (IN HOUSE-CHCC): CEA (CHCC-In House): 1.44 ng/mL (ref 0.00–5.00)

## 2019-07-29 LAB — BASIC METABOLIC PANEL - CANCER CENTER ONLY
Anion gap: 10 (ref 5–15)
BUN: 10 mg/dL (ref 6–20)
CO2: 24 mmol/L (ref 22–32)
Calcium: 9.5 mg/dL (ref 8.9–10.3)
Chloride: 105 mmol/L (ref 98–111)
Creatinine: 0.85 mg/dL (ref 0.44–1.00)
GFR, Est AFR Am: >60 mL/min (ref >60)
GFR, Estimated: >60 mL/min (ref >60)
Glucose, Bld: 83 mg/dL (ref 70–99)
Potassium: 4.4 mmol/L (ref 3.5–5.1)
Sodium: 139 mmol/L (ref 135–145)

## 2019-07-29 MED ORDER — IOHEXOL 300 MG/ML  SOLN
100.0000 mL | Freq: Once | INTRAMUSCULAR | Status: AC | PRN
  Administered 2019-07-29: 100 mL via INTRAVENOUS

## 2019-07-29 MED ORDER — SODIUM CHLORIDE (PF) 0.9 % IJ SOLN
INTRAMUSCULAR | Status: AC
  Filled 2019-07-29: qty 50

## 2019-07-29 NOTE — Telephone Encounter (Signed)
Scheduled appt per 9/8 los - pt aware of aptps .

## 2019-07-29 NOTE — Progress Notes (Signed)
[place] OFFICE PROGRESS NOTE   Diagnosis: Colon cancer  INTERVAL HISTORY:   [name] returns as scheduled.  She feels well.  Good appetite and energy level.  No complaints.  Objective:  Vital signs in last 24 hours:  Blood pressure 138/83, pulse 87, temperature 98.7 F (37.1 C), temperature source Temporal, resp. rate 16, height 5' 8.11" (1.73 m), weight 140 lb 14.4 oz (63.9 kg), last menstrual period 04/20/2013, SpO2 100 %.    Physical examination not performed today secondary to distancing with the COVID pandemic and patient preference Lab Results:  Lab Results  Component Value Date   WBC 3.9 (L) 08/14/2018   HGB 14.1 08/14/2018   HCT 41.5 08/14/2018   MCV 91.4 08/14/2018   PLT 166 08/14/2018   NEUTROABS 2.4 04/10/2017    CMP  Lab Results  Component Value Date   NA 139 07/29/2019   K 4.4 07/29/2019   CL 105 07/29/2019   CO2 24 07/29/2019   GLUCOSE 83 07/29/2019   BUN 10 07/29/2019   CREATININE 0.85 07/29/2019   CALCIUM 9.5 07/29/2019   PROT 6.5 01/30/2017   ALBUMIN 3.7 01/30/2017   AST 27 01/30/2017   ALT 16 01/30/2017   ALKPHOS 126 01/30/2017   BILITOT 0.41 01/30/2017   GFRNONAA >60 07/29/2019   GFRAA >60 07/29/2019    Lab Results  Component Value Date   CEA1 1.44 07/29/2019    Lab Results  Component Value Date   INR 1.27 07/13/2016    Imaging:  Ct Chest W Contrast  Result Date: 07/29/2019 CLINICAL DATA:  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 EXAM: CT CHEST, ABDOMEN, AND PELVIS WITH CONTRAST TECHNIQUE: Multidetector CT imaging of the chest, abdomen and pelvis was performed following the standard protocol during bolus administration of intravenous contrast. CONTRAST:  172m OMNIPAQUE IOHEXOL 300 MG/ML  SOLN COMPARISON:  Chest CT 04/29/2019. Chest abdomen pelvis CT 01/27/2019. FINDINGS: CT CHEST FINDINGS Cardiovascular: The heart size is normal. No  substantial pericardial effusion. No thoracic aortic aneurysm. Mediastinum/Nodes: No mediastinal lymphadenopathy. There is no hilar lymphadenopathy. The esophagus has normal imaging features. There is no axillary lymphadenopathy. Lungs/Pleura: Trace biapical pleuroparenchymal scarring evident. Tiny nodule right lung apex (22/4) is unchanged. No new suspicious pulmonary nodule or mass. No focal consolidation. No pleural effusion. Musculoskeletal: No worrisome lytic or sclerotic osseous abnormality. CT ABDOMEN PELVIS FINDINGS Hepatobiliary: Multiple well-defined homogeneous low-density liver lesions again noted, compatible with cysts and unchanged in the interval. No new suspicious liver lesion on the current study. Surgical margin inferior right liver compatible with partial hepatectomy. There is no evidence for gallstones, gallbladder wall thickening, or pericholecystic fluid. No intrahepatic or extrahepatic biliary dilation. Pancreas: No focal mass lesion. No dilatation of the main duct. No intraparenchymal cyst. No peripancreatic edema. Spleen: No splenomegaly. No focal mass lesion. Adrenals/Urinary Tract: No adrenal nodule or mass. 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 Small cyst interpolar left kidney is stable at 8 mm diameter. The urinary bladder appears normal for the degree of distention. Stomach/Bowel: Stomach is unremarkable. No gastric wall thickening. No evidence of outlet obstruction. Duodenum is normally positioned as is the ligament of Treitz. No small bowel wall thickening. No small bowel dilatation. The terminal ileum is normal. The appendix is normal. No gross colonic mass. No colonic wall thickening. Vascular/Lymphatic: There is abdominal aortic atherosclerosis without aneurysm. There is no gastrohepatic or hepatoduodenal  ligament lymphadenopathy. No intraperitoneal or retroperitoneal lymphadenopathy. No pelvic sidewall  lymphadenopathy. Reproductive: The uterus is unremarkable.  There is no adnexal mass. Other: No intraperitoneal free fluid. Musculoskeletal: No worrisome lytic or sclerotic osseous abnormality. IMPRESSION: 1. Stable exam. No new or progressive interval findings to suggest recurrent or metastatic disease. 2.  Aortic Atherosclerois (ICD10-170.0) Electronically Signed   By: Misty Stanley M.D.   On: 07/29/2019 09:46   Ct Abdomen Pelvis W Contrast  Result Date: 07/29/2019 CLINICAL DATA:  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 EXAM: CT CHEST, ABDOMEN, AND PELVIS WITH CONTRAST TECHNIQUE: Multidetector CT imaging of the chest, abdomen and pelvis was performed following the standard protocol during bolus administration of intravenous contrast. CONTRAST:  193m OMNIPAQUE IOHEXOL 300 MG/ML  SOLN COMPARISON:  Chest CT 04/29/2019. Chest abdomen pelvis CT 01/27/2019. FINDINGS: CT CHEST FINDINGS Cardiovascular: The heart size is normal. No substantial pericardial effusion. No thoracic aortic aneurysm. Mediastinum/Nodes: No mediastinal lymphadenopathy. There is no hilar lymphadenopathy. The esophagus has normal imaging features. There is no axillary lymphadenopathy. Lungs/Pleura: Trace biapical pleuroparenchymal scarring evident. Tiny nodule right lung apex (22/4) is unchanged. No new suspicious pulmonary nodule or mass. No focal consolidation. No pleural effusion. Musculoskeletal: No worrisome lytic or sclerotic osseous abnormality. CT ABDOMEN PELVIS FINDINGS Hepatobiliary: Multiple well-defined homogeneous low-density liver lesions again noted, compatible with cysts and unchanged in the interval. No new suspicious liver lesion on the current study. Surgical margin inferior right liver compatible with partial hepatectomy. There is no evidence for gallstones, gallbladder wall thickening, or pericholecystic fluid. No intrahepatic or extrahepatic biliary  dilation. Pancreas: No focal mass lesion. No dilatation of the main duct. No intraparenchymal cyst. No peripancreatic edema. Spleen: No splenomegaly. No focal mass lesion. Adrenals/Urinary Tract: No adrenal nodule or mass. 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 Small cyst interpolar left kidney is stable at 8 mm diameter. The urinary bladder appears normal for the degree of distention. Stomach/Bowel: Stomach is unremarkable. No gastric wall thickening. No evidence of outlet obstruction. Duodenum is normally positioned as is the ligament of Treitz. No small bowel wall thickening. No small bowel dilatation. The terminal ileum is normal. The appendix is normal. No gross colonic mass. No colonic wall thickening. Vascular/Lymphatic: There is abdominal aortic atherosclerosis without aneurysm. There is no gastrohepatic or hepatoduodenal ligament lymphadenopathy. No intraperitoneal or retroperitoneal lymphadenopathy. No pelvic sidewall lymphadenopathy. Reproductive: The uterus is unremarkable.  There is no adnexal mass. Other: No intraperitoneal free fluid. Musculoskeletal: No worrisome lytic or sclerotic osseous abnormality. IMPRESSION: 1. Stable exam. No new or progressive interval findings to suggest recurrent or metastatic disease. 2.  Aortic Atherosclerois (ICD10-170.0) Electronically Signed   By: EMisty StanleyM.D.   On: 07/29/2019 09:46    Medications: I have reviewed the patient's current medications.   Assessment/Plan:  1. Adenocarcinoma of the sigmoid colon, status post a colonoscopic biopsy 07/07/2016  Mismatch repair protein expression-normal  Sigmoid mass noted at 20 cm from the anal verge  Staging CTs of the chest, abdomen, and pelvis negative for evidence of metastatic disease other than an indeterminate 10 mm right liver lesion  MRI of the abdomen 07/07/2016 revealed multiple bilateral hepatic cyst. 7 mm inferior subcapsular  right liver lesion suspicious for a metastasis  PET scan 07/21/2016-hypermetabolic sigmoid colon mass, mildly hypermetabolic segment 6 liver lesion  Laparoscopic assisted rectosigmoid colectomy and segment 8 liver resection 07/27/2016, pathology confirmed a T3,  N2b, M1tumor, 11/16 lymph nodes positive for metastatic carcinoma  Foundation 1 testing found the tumor to be microsatellite stable, tumor mutation burden low, no BRAFor RAS mutation  No loss of mismatch repair protein expression, MSI-stable  Cycle 1 CAPOX 08/22/2016  Cycle 2 CAPOX 09/12/2016  Cycle 3 CAPOX 10/03/2016 (Xeloda dose reduced to 3 tablets every morning and 2 tablets every evening beginning with cycle 3)  Cycle 4 CAPOX 10/31/2016 (Xeloda re-escalated to original dose)  Cycle 5 CAPOX 11/21/2016  Cycle 6 CAPOX 12/12/2016  Cycle 7 CAPOX 01/09/2017 (oxaliplatin held)  Cycle 8 CAPOX 01/30/2017 (oxaliplatin dose reduced)  CT scans 02/27/2017-negative for recurrent colon cancer  CTs 08/14/2017-negative for recurrent cancer  CTs 02/12/2018-negative for recurrent cancer  CTs 07/29/2018-negative for recurrent cancer   CTs 01/27/2019-negative for recurrent cancer, new 8 mm subpleural left lower lobe nodule  CT chest 04/29/2019- resolution of left lower lobe nodule  CT 07/29/2019- negative for recurrent cancer  2. History of rectal bleeding secondary to #1  3. Multiple polyps on the colonoscopy 07/07/2016  Admission 07/13/2016 with acute GI bleeding secondary to an ulcer at the cecum  Colonoscopy 10/08/2017-small polyps removed from the cecum-colonic mucosa with lymphoid aggregates-no dysplasia or malignancy  4. Family history of breast and pancreas cancer  5. Delayed nausea following cycle 1 CAPOX-emend added with cycle 2  6. Diarrhea following cycle 2 and cycle 6 CAPOX  7. Neutropenia/thrombocytopenia secondary to chemotherapy  8. Mild oxaliplatin neuropathy- resolved    Disposition:  [name] is in remission from colon cancer.  She will return for an office visit and restaging CTs in 6 months. She saw Dr. Jana Hakim in the high risk clinic secondary to a family history of breast and pancreas cancer. She is scheduled for a screening breast MRI.  Betsy Coder, MD  07/29/2019  11:43 AM

## 2019-10-03 ENCOUNTER — Ambulatory Visit
Admission: RE | Admit: 2019-10-03 | Discharge: 2019-10-03 | Disposition: A | Discharge status: Home / Self Care | Payer: BC Managed Care – PPO | Source: Ambulatory Visit | Attending: Oncology | Admitting: Oncology

## 2019-10-03 ENCOUNTER — Other Ambulatory Visit: Payer: Self-pay

## 2019-10-03 DIAGNOSIS — Z9189 Other specified personal risk factors, not elsewhere classified: Secondary | ICD-10-CM

## 2019-10-03 DIAGNOSIS — Z1239 Encounter for other screening for malignant neoplasm of breast: Secondary | ICD-10-CM

## 2019-10-03 DIAGNOSIS — Z803 Family history of malignant neoplasm of breast: Secondary | ICD-10-CM | POA: Diagnosis not present

## 2019-10-03 MED ORDER — GADOBUTROL 1 MMOL/ML IV SOLN
7.0000 mL | Freq: Once | INTRAVENOUS | Status: AC | PRN
  Administered 2019-10-03: 7 mL via INTRAVENOUS

## 2020-01-27 ENCOUNTER — Ambulatory Visit
Admission: RE | Admit: 2020-01-27 | Discharge: 2020-01-27 | Disposition: A | Discharge status: Home / Self Care | Payer: BC Managed Care – PPO | Source: Ambulatory Visit | Attending: Oncology | Admitting: Oncology

## 2020-01-27 ENCOUNTER — Encounter: Payer: Self-pay | Admitting: Nurse Practitioner

## 2020-01-27 ENCOUNTER — Other Ambulatory Visit: Payer: Self-pay

## 2020-01-27 ENCOUNTER — Telehealth: Payer: Self-pay | Admitting: *Deleted

## 2020-01-27 ENCOUNTER — Other Ambulatory Visit: Payer: Self-pay | Admitting: *Deleted

## 2020-01-27 ENCOUNTER — Inpatient Hospital Stay: Payer: BC Managed Care – PPO | Attending: Nurse Practitioner

## 2020-01-27 ENCOUNTER — Inpatient Hospital Stay (HOSPITAL_BASED_OUTPATIENT_CLINIC_OR_DEPARTMENT_OTHER): Payer: BC Managed Care – PPO | Admitting: Nurse Practitioner

## 2020-01-27 VITALS — BP 126/81 | HR 82 | Temp 97.6°F | Resp 18 | Ht 68.0 in | Wt 140.7 lb

## 2020-01-27 DIAGNOSIS — C187 Malignant neoplasm of sigmoid colon: Secondary | ICD-10-CM

## 2020-01-27 DIAGNOSIS — C189 Malignant neoplasm of colon, unspecified: Secondary | ICD-10-CM | POA: Diagnosis not present

## 2020-01-27 DIAGNOSIS — K769 Liver disease, unspecified: Secondary | ICD-10-CM | POA: Insufficient documentation

## 2020-01-27 DIAGNOSIS — Z800 Family history of malignant neoplasm of digestive organs: Secondary | ICD-10-CM | POA: Insufficient documentation

## 2020-01-27 DIAGNOSIS — Z9221 Personal history of antineoplastic chemotherapy: Secondary | ICD-10-CM | POA: Diagnosis not present

## 2020-01-27 LAB — BASIC METABOLIC PANEL - CANCER CENTER ONLY
Anion gap: 9 (ref 5–15)
BUN: 13 mg/dL (ref 6–20)
CO2: 24 mmol/L (ref 22–32)
Calcium: 9.5 mg/dL (ref 8.9–10.3)
Chloride: 108 mmol/L (ref 98–111)
Creatinine: 0.85 mg/dL (ref 0.44–1.00)
GFR, Est AFR Am: >60 mL/min (ref >60)
GFR, Estimated: >60 mL/min (ref >60)
Glucose, Bld: 87 mg/dL (ref 70–99)
Potassium: 4.4 mmol/L (ref 3.5–5.1)
Sodium: 141 mmol/L (ref 135–145)

## 2020-01-27 LAB — CEA (IN HOUSE-CHCC): CEA (CHCC-In House): 1.24 ng/mL (ref 0.00–5.00)

## 2020-01-27 MED ORDER — IOPAMIDOL (ISOVUE-300) INJECTION 61%
100.0000 mL | Freq: Once | INTRAVENOUS | Status: AC | PRN
  Administered 2020-01-27: 100 mL via INTRAVENOUS

## 2020-01-27 NOTE — Telephone Encounter (Signed)
Called husband with appointment for MRI at [facility] tomorrow at 0900 and arrive in radiology at 0830. NPO after midnight. Inquired if she needs something to take to relax for scan and he reports she has something here if necessary. Informed him they will be called as soon as results are obtained.

## 2020-01-27 NOTE — Progress Notes (Signed)
[facility] OFFICE PROGRESS NOTE   Diagnosis: Colon cancer  INTERVAL HISTORY:   Patricia Hudson returns as scheduled.  She feels well.  No change in bowel habits.  No bloody or black bowel movements.  She denies abdominal pain.  No nausea or vomiting.  She has a good appetite.  No fever.  Objective:  Vital signs in last 24 hours:  Blood pressure 126/81, pulse 82, temperature 97.6 F (36.4 C), resp. rate 18, height '5\' 8"'  (1.727 m), weight 140 lb 11.2 oz (63.8 kg), last menstrual period 04/20/2013, SpO2 99 %.    HEENT: Neck without mass. Lymphatics: No palpable cervical, supraclavicular, axillary or inguinal lymph nodes. GI: Abdomen soft and nontender.  No hepatomegaly. Vascular: No leg edema.   Lab Results:  Lab Results  Component Value Date   WBC 3.9 (L) 08/14/2018   HGB 14.1 08/14/2018   HCT 41.5 08/14/2018   MCV 91.4 08/14/2018   PLT 166 08/14/2018   NEUTROABS 2.4 04/10/2017    Imaging:  CT CHEST W CONTRAST  Result Date: 01/27/2020 CLINICAL DATA:  Colon cancer. Restaging. EXAM: CT CHEST, ABDOMEN, AND PELVIS WITH CONTRAST TECHNIQUE: Multidetector CT imaging of the chest, abdomen and pelvis was performed following the standard protocol during bolus administration of intravenous contrast. CONTRAST:  159m ISOVUE-300 IOPAMIDOL (ISOVUE-300) INJECTION 61% COMPARISON:  07/29/2019 FINDINGS: CT CHEST FINDINGS Cardiovascular: The heart size is normal. No substantial pericardial effusion. No thoracic aortic aneurysm. Mediastinum/Nodes: No mediastinal lymphadenopathy. There is no hilar lymphadenopathy. The esophagus has normal imaging features. There is no axillary lymphadenopathy. Lungs/Pleura: 4 mm right apical nodule on 13/4 is stable, likely scarring. No suspicious pulmonary nodule or mass. No focal airspace consolidation. No pleural effusion. Musculoskeletal: No worrisome lytic or sclerotic osseous abnormality. CT ABDOMEN PELVIS FINDINGS Hepatobiliary: Status post partial  right hepatectomy. Previously identified well-defined low-density lesions in the liver are compatible with cysts. 1.6 x 1.6 cm lesion is identified in the lateral segment left liver, new in the interval and with attenuation too high to be a cyst. There is no evidence for gallstones, gallbladder wall thickening, or pericholecystic fluid. No intrahepatic or extrahepatic biliary dilation. Pancreas: No focal mass lesion. No dilatation of the main duct. No intraparenchymal cyst. No peripancreatic edema. Spleen: No splenomegaly. No focal mass lesion. Adrenals/Urinary Tract: No adrenal nodule or mass. Right kidney unremarkable. 9 mm subcapsular lesion interpolar left kidney (71/2) is stable in size with attenuation too high to be a simple cyst. No evidence for hydroureter. The urinary bladder appears normal for the degree of distention. Stomach/Bowel: Stomach is unremarkable. No gastric wall thickening. No evidence of outlet obstruction. Duodenum is normally positioned as is the ligament of Treitz. No small bowel wall thickening. No small bowel dilatation. The terminal ileum is normal. The appendix is best seen on coronal images and is unremarkable. No gross colonic mass. No colonic wall thickening. Diffuse moderate stool volume evident. Vascular/Lymphatic: There is abdominal aortic atherosclerosis without aneurysm. There is no gastrohepatic or hepatoduodenal ligament lymphadenopathy. No retroperitoneal or mesenteric lymphadenopathy. No pelvic sidewall lymphadenopathy. Reproductive: The uterus is unremarkable.  There is no adnexal mass. Other: No intraperitoneal free fluid. Musculoskeletal: No worrisome lytic or sclerotic osseous abnormality. IMPRESSION: 1. 1.6 x 1.6 cm lesion lateral segment left liver, new in the interval and with attenuation too high to be a simple cyst. Lesion highly concerning for metastatic disease. MRI without and with contrast may prove helpful to further evaluate. This lesion should be amenable to  percutaneous tissue sampling  as clinically warranted. 2. Otherwise stable exam. 3. 9 mm subcapsular lesion interpolar left kidney is stable in size with attenuation too high to be a simple cyst. Attention on follow-up recommended. 4.  Aortic Atherosclerois (ICD10-170.0) These results will be called to the ordering clinician or representative by the Radiologist Assistant, and communication documented in the PACS or zVision Dashboard. Electronically Signed   By: Misty Stanley M.D.   On: 01/27/2020 10:03   CT ABDOMEN PELVIS W CONTRAST  Result Date: 01/27/2020 CLINICAL DATA:  Colon cancer. Restaging. EXAM: CT CHEST, ABDOMEN, AND PELVIS WITH CONTRAST TECHNIQUE: Multidetector CT imaging of the chest, abdomen and pelvis was performed following the standard protocol during bolus administration of intravenous contrast. CONTRAST:  119m ISOVUE-300 IOPAMIDOL (ISOVUE-300) INJECTION 61% COMPARISON:  07/29/2019 FINDINGS: CT CHEST FINDINGS Cardiovascular: The heart size is normal. No substantial pericardial effusion. No thoracic aortic aneurysm. Mediastinum/Nodes: No mediastinal lymphadenopathy. There is no hilar lymphadenopathy. The esophagus has normal imaging features. There is no axillary lymphadenopathy. Lungs/Pleura: 4 mm right apical nodule on 13/4 is stable, likely scarring. No suspicious pulmonary nodule or mass. No focal airspace consolidation. No pleural effusion. Musculoskeletal: No worrisome lytic or sclerotic osseous abnormality. CT ABDOMEN PELVIS FINDINGS Hepatobiliary: Status post partial right hepatectomy. Previously identified well-defined low-density lesions in the liver are compatible with cysts. 1.6 x 1.6 cm lesion is identified in the lateral segment left liver, new in the interval and with attenuation too high to be a cyst. There is no evidence for gallstones, gallbladder wall thickening, or pericholecystic fluid. No intrahepatic or extrahepatic biliary dilation. Pancreas: No focal mass lesion. No  dilatation of the main duct. No intraparenchymal cyst. No peripancreatic edema. Spleen: No splenomegaly. No focal mass lesion. Adrenals/Urinary Tract: No adrenal nodule or mass. Right kidney unremarkable. 9 mm subcapsular lesion interpolar left kidney (71/2) is stable in size with attenuation too high to be a simple cyst. No evidence for hydroureter. The urinary bladder appears normal for the degree of distention. Stomach/Bowel: Stomach is unremarkable. No gastric wall thickening. No evidence of outlet obstruction. Duodenum is normally positioned as is the ligament of Treitz. No small bowel wall thickening. No small bowel dilatation. The terminal ileum is normal. The appendix is best seen on coronal images and is unremarkable. No gross colonic mass. No colonic wall thickening. Diffuse moderate stool volume evident. Vascular/Lymphatic: There is abdominal aortic atherosclerosis without aneurysm. There is no gastrohepatic or hepatoduodenal ligament lymphadenopathy. No retroperitoneal or mesenteric lymphadenopathy. No pelvic sidewall lymphadenopathy. Reproductive: The uterus is unremarkable.  There is no adnexal mass. Other: No intraperitoneal free fluid. Musculoskeletal: No worrisome lytic or sclerotic osseous abnormality. IMPRESSION: 1. 1.6 x 1.6 cm lesion lateral segment left liver, new in the interval and with attenuation too high to be a simple cyst. Lesion highly concerning for metastatic disease. MRI without and with contrast may prove helpful to further evaluate. This lesion should be amenable to percutaneous tissue sampling as clinically warranted. 2. Otherwise stable exam. 3. 9 mm subcapsular lesion interpolar left kidney is stable in size with attenuation too high to be a simple cyst. Attention on follow-up recommended. 4.  Aortic Atherosclerois (ICD10-170.0) These results will be called to the ordering clinician or representative by the Radiologist Assistant, and communication documented in the PACS or  zVision Dashboard. Electronically Signed   By: EMisty StanleyM.D.   On: 01/27/2020 10:03    Medications: I have reviewed the patient's current medications.  Assessment/Plan:  1. Adenocarcinoma of the sigmoid colon, status post a  colonoscopic biopsy 07/07/2016  Mismatch repair protein expression-normal  Sigmoid mass noted at 20 cm from the anal verge  Staging CTs of the chest, abdomen, and pelvis negative for evidence of metastatic disease other than an indeterminate 10 mm right liver lesion  MRI of the abdomen 07/07/2016 revealed multiple bilateral hepatic cyst. 7 mm inferior subcapsular right liver lesion suspicious for a metastasis  PET scan 07/21/2016-hypermetabolic sigmoid colon mass, mildly hypermetabolic segment 6 liver lesion  Laparoscopic assisted rectosigmoid colectomy and segment 8 liver resection 07/27/2016, pathology confirmed a T3, N2b, M1tumor, 11/16 lymph nodes positive for metastatic carcinoma  Foundation 1 testing found the tumor to be microsatellite stable, tumor mutation burden low, no BRAFor RAS mutation  No loss of mismatch repair protein expression, MSI-stable  Cycle 1 CAPOX 08/22/2016  Cycle 2 CAPOX 09/12/2016  Cycle 3 CAPOX 10/03/2016 (Xeloda dose reduced to 3 tablets every morning and 2 tablets every evening beginning with cycle 3)  Cycle 4 CAPOX 10/31/2016 (Xeloda re-escalated to original dose)  Cycle 5 CAPOX 11/21/2016  Cycle 6 CAPOX 12/12/2016  Cycle 7 CAPOX 01/09/2017 (oxaliplatin held)  Cycle 8 CAPOX 01/30/2017 (oxaliplatin dose reduced)  CT scans 02/27/2017-negative for recurrent colon cancer  CTs 08/14/2017-negative for recurrent cancer  CTs 02/12/2018-negative for recurrent cancer  CTs 07/29/2018-negative for recurrent cancer   CTs 01/27/2019-negative for recurrent cancer, new 8 mm subpleural left lower lobe nodule  CT chest 04/29/2019- resolution of left lower lobe nodule  CT 07/29/2019- negative for recurrent cancer  CTs  01/27/2020-stable 4 mm right apical lung nodule.  New 1.6 x 1.6 cm lesion identified in the lateral segment left liver.  Stable 9 mm subcapsular lesion interpolar left kidney.  2. History of rectal bleeding secondary to #1  3. Multiple polyps on the colonoscopy 07/07/2016  Admission 07/13/2016 with acute GI bleeding secondary to an ulcer at the cecum  Colonoscopy 10/08/2017-small polyps removed from the cecum-colonic mucosa with lymphoid aggregates-no dysplasia or malignancy  4. Family history of breast and pancreas cancer  5. Delayed nausea following cycle 1 CAPOX-emend added with cycle 2  6. Diarrhea following cycle 2 and cycle 6 CAPOX  7. Neutropenia/thrombocytopenia secondary to chemotherapy  8. Mild oxaliplatin neuropathy- resolved   Disposition: Patricia Hudson appears stable.  The CT scan from earlier today shows a new liver lesion.  Dr. Benay Spice reviewed the images with her at today's visit.  She understands the lesion is concerning for metastatic disease.  We are referring her for an MRI of the liver.  She will return for a follow-up visit on 01/30/2020 to review the results.  Her husband was present by phone for the visit.  Patient seen with Dr. Benay Spice.  Ned Card ANP/GNP-BC   01/27/2020  1:48 PM This was a shared visit with Ned Card.  We discussed the CT findings and reviewed the images with Patricia Hudson.  Her husband was present by telephone for today's visit.  There is a new left liver lesion suspicious for metastasis.  She will be referred for an MRI of the liver.  We discussed the prognosis and treatment options.  We discussed surgical resection, ablation, and SBRT.  She is now approximately 3-1/2 years out from diagnosis.  We will present her case at the GI tumor conference.  We will consult Dr. Barry Dienes to consider hepatic resection if the lesion is isolated.  Julieanne Manson, MD

## 2020-01-28 ENCOUNTER — Ambulatory Visit (HOSPITAL_COMMUNITY)
Admission: RE | Admit: 2020-01-28 | Discharge: 2020-01-28 | Disposition: A | Discharge status: Home / Self Care | Payer: BC Managed Care – PPO | Source: Ambulatory Visit | Attending: Nurse Practitioner | Admitting: Nurse Practitioner

## 2020-01-28 ENCOUNTER — Telehealth: Payer: Self-pay | Admitting: Oncology

## 2020-01-28 DIAGNOSIS — C187 Malignant neoplasm of sigmoid colon: Secondary | ICD-10-CM | POA: Diagnosis not present

## 2020-01-28 DIAGNOSIS — C189 Malignant neoplasm of colon, unspecified: Secondary | ICD-10-CM | POA: Diagnosis not present

## 2020-01-28 MED ORDER — GADOBUTROL 1 MMOL/ML IV SOLN
6.0000 mL | Freq: Once | INTRAVENOUS | Status: AC | PRN
  Administered 2020-01-28: 6 mL via INTRAVENOUS

## 2020-01-28 NOTE — Telephone Encounter (Signed)
Scheduled appt pr 3/10 sch message - unable to reach pt and vmail full - message sent to RN to try again

## 2020-01-30 ENCOUNTER — Inpatient Hospital Stay (HOSPITAL_BASED_OUTPATIENT_CLINIC_OR_DEPARTMENT_OTHER): Payer: BC Managed Care – PPO | Admitting: Oncology

## 2020-01-30 ENCOUNTER — Other Ambulatory Visit: Payer: Self-pay

## 2020-01-30 ENCOUNTER — Encounter: Payer: Self-pay | Admitting: *Deleted

## 2020-01-30 VITALS — BP 138/84 | HR 93 | Temp 98.0°F | Resp 16 | Ht 68.0 in | Wt 137.9 lb

## 2020-01-30 DIAGNOSIS — Z9221 Personal history of antineoplastic chemotherapy: Secondary | ICD-10-CM | POA: Diagnosis not present

## 2020-01-30 DIAGNOSIS — C187 Malignant neoplasm of sigmoid colon: Secondary | ICD-10-CM | POA: Diagnosis not present

## 2020-01-30 DIAGNOSIS — Z800 Family history of malignant neoplasm of digestive organs: Secondary | ICD-10-CM | POA: Diagnosis not present

## 2020-01-30 DIAGNOSIS — K769 Liver disease, unspecified: Secondary | ICD-10-CM | POA: Diagnosis not present

## 2020-01-30 NOTE — Progress Notes (Signed)
Dr. Benay Spice spoke w/Dr. Reynaldo Minium today and he agrees to see patient on 02/05/2020 at 3:30 pm to discuss surgical resection w/liver infusion.

## 2020-01-30 NOTE — Progress Notes (Signed)
[facility] OFFICE PROGRESS NOTE   Diagnosis: Colon cancer  INTERVAL HISTORY:   Ms. Dekoning returns as scheduled.  She feels well.  No complaint.  She is here today with her husband.  Objective:  Vital signs in last 24 hours:  Blood pressure 138/84, pulse 93, temperature 98 F (36.7 C), temperature source Temporal, resp. rate 16, height '5\' 8"'  (1.727 m), weight 137 lb 14.4 oz (62.6 kg), last menstrual period 04/20/2013, SpO2 100 %.     Lymphatics: No cervical, supraclavicular, axillary, or inguinal nodes GI: No hepatosplenomegaly, no mass, nontender, midline scar without evidence of recurrent tumor Vascular: No leg edema   Lab Results:   CMP  Lab Results  Component Value Date   NA 141 01/27/2020   K 4.4 01/27/2020   CL 108 01/27/2020   CO2 24 01/27/2020   GLUCOSE 87 01/27/2020   BUN 13 01/27/2020   CREATININE 0.85 01/27/2020   CALCIUM 9.5 01/27/2020   PROT 6.5 01/30/2017   ALBUMIN 3.7 01/30/2017   AST 27 01/30/2017   ALT 16 01/30/2017   ALKPHOS 126 01/30/2017   BILITOT 0.41 01/30/2017   GFRNONAA >60 01/27/2020   GFRAA >60 01/27/2020    Lab Results  Component Value Date   CEA1 1.24 01/27/2020     Imaging:  CT CHEST W CONTRAST  Result Date: 01/27/2020 CLINICAL DATA:  Colon cancer. Restaging. EXAM: CT CHEST, ABDOMEN, AND PELVIS WITH CONTRAST TECHNIQUE: Multidetector CT imaging of the chest, abdomen and pelvis was performed following the standard protocol during bolus administration of intravenous contrast. CONTRAST:  128m ISOVUE-300 IOPAMIDOL (ISOVUE-300) INJECTION 61% COMPARISON:  07/29/2019 FINDINGS: CT CHEST FINDINGS Cardiovascular: The heart size is normal. No substantial pericardial effusion. No thoracic aortic aneurysm. Mediastinum/Nodes: No mediastinal lymphadenopathy. There is no hilar lymphadenopathy. The esophagus has normal imaging features. There is no axillary lymphadenopathy. Lungs/Pleura: 4 mm right apical nodule on 13/4 is stable,  likely scarring. No suspicious pulmonary nodule or mass. No focal airspace consolidation. No pleural effusion. Musculoskeletal: No worrisome lytic or sclerotic osseous abnormality. CT ABDOMEN PELVIS FINDINGS Hepatobiliary: Status post partial right hepatectomy. Previously identified well-defined low-density lesions in the liver are compatible with cysts. 1.6 x 1.6 cm lesion is identified in the lateral segment left liver, new in the interval and with attenuation too high to be a cyst. There is no evidence for gallstones, gallbladder wall thickening, or pericholecystic fluid. No intrahepatic or extrahepatic biliary dilation. Pancreas: No focal mass lesion. No dilatation of the main duct. No intraparenchymal cyst. No peripancreatic edema. Spleen: No splenomegaly. No focal mass lesion. Adrenals/Urinary Tract: No adrenal nodule or mass. Right kidney unremarkable. 9 mm subcapsular lesion interpolar left kidney (71/2) is stable in size with attenuation too high to be a simple cyst. No evidence for hydroureter. The urinary bladder appears normal for the degree of distention. Stomach/Bowel: Stomach is unremarkable. No gastric wall thickening. No evidence of outlet obstruction. Duodenum is normally positioned as is the ligament of Treitz. No small bowel wall thickening. No small bowel dilatation. The terminal ileum is normal. The appendix is best seen on coronal images and is unremarkable. No gross colonic mass. No colonic wall thickening. Diffuse moderate stool volume evident. Vascular/Lymphatic: There is abdominal aortic atherosclerosis without aneurysm. There is no gastrohepatic or hepatoduodenal ligament lymphadenopathy. No retroperitoneal or mesenteric lymphadenopathy. No pelvic sidewall lymphadenopathy. Reproductive: The uterus is unremarkable.  There is no adnexal mass. Other: No intraperitoneal free fluid. Musculoskeletal: No worrisome lytic or sclerotic osseous abnormality. IMPRESSION: 1. 1.6  x 1.6 cm lesion lateral  segment left liver, new in the interval and with attenuation too high to be a simple cyst. Lesion highly concerning for metastatic disease. MRI without and with contrast may prove helpful to further evaluate. This lesion should be amenable to percutaneous tissue sampling as clinically warranted. 2. Otherwise stable exam. 3. 9 mm subcapsular lesion interpolar left kidney is stable in size with attenuation too high to be a simple cyst. Attention on follow-up recommended. 4.  Aortic Atherosclerois (ICD10-170.0) These results will be called to the ordering clinician or representative by the Radiologist Assistant, and communication documented in the PACS or zVision Dashboard. Electronically Signed   By: Misty Stanley M.D.   On: 01/27/2020 10:03   CT ABDOMEN PELVIS W CONTRAST  Result Date: 01/27/2020 CLINICAL DATA:  Colon cancer. Restaging. EXAM: CT CHEST, ABDOMEN, AND PELVIS WITH CONTRAST TECHNIQUE: Multidetector CT imaging of the chest, abdomen and pelvis was performed following the standard protocol during bolus administration of intravenous contrast. CONTRAST:  149m ISOVUE-300 IOPAMIDOL (ISOVUE-300) INJECTION 61% COMPARISON:  07/29/2019 FINDINGS: CT CHEST FINDINGS Cardiovascular: The heart size is normal. No substantial pericardial effusion. No thoracic aortic aneurysm. Mediastinum/Nodes: No mediastinal lymphadenopathy. There is no hilar lymphadenopathy. The esophagus has normal imaging features. There is no axillary lymphadenopathy. Lungs/Pleura: 4 mm right apical nodule on 13/4 is stable, likely scarring. No suspicious pulmonary nodule or mass. No focal airspace consolidation. No pleural effusion. Musculoskeletal: No worrisome lytic or sclerotic osseous abnormality. CT ABDOMEN PELVIS FINDINGS Hepatobiliary: Status post partial right hepatectomy. Previously identified well-defined low-density lesions in the liver are compatible with cysts. 1.6 x 1.6 cm lesion is identified in the lateral segment left liver, new  in the interval and with attenuation too high to be a cyst. There is no evidence for gallstones, gallbladder wall thickening, or pericholecystic fluid. No intrahepatic or extrahepatic biliary dilation. Pancreas: No focal mass lesion. No dilatation of the main duct. No intraparenchymal cyst. No peripancreatic edema. Spleen: No splenomegaly. No focal mass lesion. Adrenals/Urinary Tract: No adrenal nodule or mass. Right kidney unremarkable. 9 mm subcapsular lesion interpolar left kidney (71/2) is stable in size with attenuation too high to be a simple cyst. No evidence for hydroureter. The urinary bladder appears normal for the degree of distention. Stomach/Bowel: Stomach is unremarkable. No gastric wall thickening. No evidence of outlet obstruction. Duodenum is normally positioned as is the ligament of Treitz. No small bowel wall thickening. No small bowel dilatation. The terminal ileum is normal. The appendix is best seen on coronal images and is unremarkable. No gross colonic mass. No colonic wall thickening. Diffuse moderate stool volume evident. Vascular/Lymphatic: There is abdominal aortic atherosclerosis without aneurysm. There is no gastrohepatic or hepatoduodenal ligament lymphadenopathy. No retroperitoneal or mesenteric lymphadenopathy. No pelvic sidewall lymphadenopathy. Reproductive: The uterus is unremarkable.  There is no adnexal mass. Other: No intraperitoneal free fluid. Musculoskeletal: No worrisome lytic or sclerotic osseous abnormality. IMPRESSION: 1. 1.6 x 1.6 cm lesion lateral segment left liver, new in the interval and with attenuation too high to be a simple cyst. Lesion highly concerning for metastatic disease. MRI without and with contrast may prove helpful to further evaluate. This lesion should be amenable to percutaneous tissue sampling as clinically warranted. 2. Otherwise stable exam. 3. 9 mm subcapsular lesion interpolar left kidney is stable in size with attenuation too high to be a  simple cyst. Attention on follow-up recommended. 4.  Aortic Atherosclerois (ICD10-170.0) These results will be called to the ordering clinician or representative by  the Radiologist Assistant, and communication documented in the PACS or zVision Dashboard. Electronically Signed   By: Misty Stanley M.D.   On: 01/27/2020 10:03   MR LIVER W WO CONTRAST  Result Date: 01/28/2020 CLINICAL DATA:  Metastatic colon cancer, new liver lesion on CT EXAM: MRI ABDOMEN WITHOUT AND WITH CONTRAST TECHNIQUE: Multiplanar multisequence MR imaging of the abdomen was performed both before and after the administration of intravenous contrast. CONTRAST:  39m GADAVIST GADOBUTROL 1 MMOL/ML IV SOLN COMPARISON:  CT abdomen/pelvis dated 01/27/2020 and 07/29/2019. FINDINGS: Motion degraded images. Lower chest: Lung bases are clear. Hepatobiliary: Status post partial right hepatectomy. Scattered hepatic cysts measuring up to 14 mm in the posterior right hepatic lobe (series 5/image 15). 15 x 12 mm hypoenhancing lesion in segment 3, progressively enhancing on later phases (series 24/image 45), unchanged from recent CT but not evident in September 2020. Associated restricted diffusion (series 6/image 71). This appearance is compatible with metastasis. Gallbladder is unremarkable. No intrahepatic or extrahepatic ductal dilatation. Pancreas:  Within normal limits. Spleen:  Within normal limits. Adrenals/Urinary Tract:  Adrenal glands are within normal limits. Suspected hemorrhagic cyst in the lateral left upper kidney (series 11/image 52) given precontrast T1 hyperintensity but without definite enhancement following contrast administration, although poorly evaluated due to motion degradation. Right kidney is within normal limits. No hydronephrosis. Stomach/Bowel: Stomach is within normal limits. Visualized bowel is unremarkable. Vascular/Lymphatic:  No evidence of abdominal aortic aneurysm. No suspicious abdominal lymphadenopathy. Other:  No  abdominal ascites. Musculoskeletal: No focal osseous lesions. IMPRESSION: 15 mm enhancing lesion in segment 3, new from September 2020, compatible with metastasis. Suspected subcentimeter hemorrhagic cyst in the lateral left upper kidney, although poorly evaluated due to motion degradation. Status post partial right hepatectomy. Electronically Signed   By: SJulian HyM.D.   On: 01/28/2020 10:05    Medications: I have reviewed the patient's current medications.   Assessment/Plan: 1. Adenocarcinoma of the sigmoid colon, status post a colonoscopic biopsy 07/07/2016  Mismatch repair protein expression-normal  Sigmoid mass noted at 20 cm from the anal verge  Staging CTs of the chest, abdomen, and pelvis negative for evidence of metastatic disease other than an indeterminate 10 mm right liver lesion  MRI of the abdomen 07/07/2016 revealed multiple bilateral hepatic cyst. 7 mm inferior subcapsular right liver lesion suspicious for a metastasis  PET scan 07/21/2016-hypermetabolic sigmoid colon mass, mildly hypermetabolic segment 6 liver lesion  Laparoscopic assisted rectosigmoid colectomy and segment 8 liver resection 07/27/2016, pathology confirmed a T3, N2b, M1tumor, 11/16 lymph nodes positive for metastatic carcinoma  Foundation 1 testing found the tumor to be microsatellite stable, tumor mutation burden low, no BRAFor RAS mutation  No loss of mismatch repair protein expression, MSI-stable  Cycle 1 CAPOX 08/22/2016  Cycle 2 CAPOX 09/12/2016  Cycle 3 CAPOX 10/03/2016 (Xeloda dose reduced to 3 tablets every morning and 2 tablets every evening beginning with cycle 3)  Cycle 4 CAPOX 10/31/2016 (Xeloda re-escalated to original dose)  Cycle 5 CAPOX 11/21/2016  Cycle 6 CAPOX 12/12/2016  Cycle 7 CAPOX 01/09/2017 (oxaliplatin held)  Cycle 8 CAPOX 01/30/2017 (oxaliplatin dose reduced)  CT scans 02/27/2017-negative for recurrent colon cancer  CTs 08/14/2017-negative for recurrent  cancer  CTs 02/12/2018-negative for recurrent cancer  CTs 07/29/2018-negative for recurrent cancer   CTs 01/27/2019-negative for recurrent cancer, new 8 mm subpleural left lower lobe nodule  CT chest 04/29/2019- resolution of left lower lobe nodule  CT 07/29/2019- negative for recurrent cancer  CTs 01/27/2020-stable 4 mm right apical lung  nodule.  New 1.6 x 1.6 cm lesion identified in the lateral segment left liver.  Stable 9 mm subcapsular lesion interpolar left kidney.  MRI liver 310 2021-15 mm enhancing lesion in segment 3, new from September 2020, subcentimeter hemorrhagic cyst in the left kidney  2. History of rectal bleeding secondary to #1  3. Multiple polyps on the colonoscopy 07/07/2016  Admission 07/13/2016 with acute GI bleeding secondary to an ulcer at the cecum  Colonoscopy 10/08/2017-small polyps removed from the cecum-colonic mucosa with lymphoid aggregates-no dysplasia or malignancy  4. Family history of breast and pancreas cancer  5. Delayed nausea following cycle 1 CAPOX-emend added with cycle 2  6. Diarrhea following cycle 2 and cycle 6 CAPOX  7. Neutropenia/thrombocytopenia secondary to chemotherapy  8. Mild oxaliplatin neuropathy- resolved    Disposition: [name] has a history of metastatic colon cancer.  She is now approximately 3.5 years out from a rectosigmoid colectomy and right liver resection.  She has a new isolated left liver lesion.  I discussed the apparent diagnosis of progressive metastatic colon cancer with [name] and her husband.  We discussed the small chance of cure with resection of the left liver lesion.  I do not recommend "adjuvant "chemotherapy as a standard treatment.  I plan to discuss the case with Dr. Reynaldo Minium at [facility].  She may benefit from a multidisciplinary consultation at [facility] to consider the indication for a hepatic infusion pump or other hepatic directed therapy in addition to resection of the left liver  lesion. She will plan on having surgery with Dr. Barry Dienes if no additional hepatic directed therapy is planned.    Ms. Tilly will be scheduled for a follow-up visit here pending the decision on surgery.  Betsy Coder, MD  01/30/2020  1:48 PM

## 2020-02-02 DIAGNOSIS — C19 Malignant neoplasm of rectosigmoid junction: Secondary | ICD-10-CM | POA: Diagnosis not present

## 2020-02-03 DIAGNOSIS — C787 Secondary malignant neoplasm of liver and intrahepatic bile duct: Secondary | ICD-10-CM | POA: Diagnosis not present

## 2020-02-03 DIAGNOSIS — C772 Secondary and unspecified malignant neoplasm of intra-abdominal lymph nodes: Secondary | ICD-10-CM | POA: Diagnosis not present

## 2020-02-03 DIAGNOSIS — C187 Malignant neoplasm of sigmoid colon: Secondary | ICD-10-CM | POA: Diagnosis not present

## 2020-02-03 DIAGNOSIS — C189 Malignant neoplasm of colon, unspecified: Secondary | ICD-10-CM | POA: Diagnosis not present

## 2020-02-04 ENCOUNTER — Other Ambulatory Visit: Payer: Self-pay

## 2020-02-05 DIAGNOSIS — C187 Malignant neoplasm of sigmoid colon: Secondary | ICD-10-CM | POA: Diagnosis not present

## 2020-02-07 DIAGNOSIS — Z23 Encounter for immunization: Secondary | ICD-10-CM | POA: Diagnosis not present

## 2020-02-09 DIAGNOSIS — Z85048 Personal history of other malignant neoplasm of rectum, rectosigmoid junction, and anus: Secondary | ICD-10-CM | POA: Diagnosis not present

## 2020-02-09 DIAGNOSIS — K660 Peritoneal adhesions (postprocedural) (postinfection): Secondary | ICD-10-CM | POA: Diagnosis not present

## 2020-02-09 DIAGNOSIS — G8918 Other acute postprocedural pain: Secondary | ICD-10-CM | POA: Diagnosis not present

## 2020-02-09 DIAGNOSIS — Z85038 Personal history of other malignant neoplasm of large intestine: Secondary | ICD-10-CM | POA: Diagnosis not present

## 2020-02-09 DIAGNOSIS — K769 Liver disease, unspecified: Secondary | ICD-10-CM | POA: Diagnosis not present

## 2020-02-09 DIAGNOSIS — C189 Malignant neoplasm of colon, unspecified: Secondary | ICD-10-CM | POA: Diagnosis not present

## 2020-02-09 DIAGNOSIS — C787 Secondary malignant neoplasm of liver and intrahepatic bile duct: Secondary | ICD-10-CM | POA: Diagnosis not present

## 2020-02-09 DIAGNOSIS — Z20822 Contact with and (suspected) exposure to covid-19: Secondary | ICD-10-CM | POA: Diagnosis not present

## 2020-02-09 DIAGNOSIS — C19 Malignant neoplasm of rectosigmoid junction: Secondary | ICD-10-CM | POA: Diagnosis not present

## 2020-02-09 DIAGNOSIS — Z9221 Personal history of antineoplastic chemotherapy: Secondary | ICD-10-CM | POA: Diagnosis not present

## 2020-02-12 DIAGNOSIS — G8918 Other acute postprocedural pain: Secondary | ICD-10-CM | POA: Diagnosis not present

## 2020-02-12 DIAGNOSIS — Z9221 Personal history of antineoplastic chemotherapy: Secondary | ICD-10-CM | POA: Diagnosis not present

## 2020-02-12 DIAGNOSIS — Z85038 Personal history of other malignant neoplasm of large intestine: Secondary | ICD-10-CM | POA: Diagnosis not present

## 2020-02-12 DIAGNOSIS — C189 Malignant neoplasm of colon, unspecified: Secondary | ICD-10-CM | POA: Diagnosis not present

## 2020-02-12 DIAGNOSIS — C19 Malignant neoplasm of rectosigmoid junction: Secondary | ICD-10-CM | POA: Diagnosis not present

## 2020-02-12 DIAGNOSIS — Z85048 Personal history of other malignant neoplasm of rectum, rectosigmoid junction, and anus: Secondary | ICD-10-CM | POA: Diagnosis not present

## 2020-02-12 DIAGNOSIS — C787 Secondary malignant neoplasm of liver and intrahepatic bile duct: Secondary | ICD-10-CM | POA: Diagnosis not present

## 2020-02-12 DIAGNOSIS — K660 Peritoneal adhesions (postprocedural) (postinfection): Secondary | ICD-10-CM | POA: Diagnosis not present

## 2020-02-19 DIAGNOSIS — C799 Secondary malignant neoplasm of unspecified site: Secondary | ICD-10-CM | POA: Diagnosis not present

## 2020-02-19 DIAGNOSIS — Z9049 Acquired absence of other specified parts of digestive tract: Secondary | ICD-10-CM | POA: Diagnosis not present

## 2020-02-19 DIAGNOSIS — Z9089 Acquired absence of other organs: Secondary | ICD-10-CM | POA: Diagnosis not present

## 2020-02-19 DIAGNOSIS — C19 Malignant neoplasm of rectosigmoid junction: Secondary | ICD-10-CM | POA: Diagnosis not present

## 2020-03-01 DIAGNOSIS — Z9049 Acquired absence of other specified parts of digestive tract: Secondary | ICD-10-CM | POA: Diagnosis not present

## 2020-03-01 DIAGNOSIS — Z9089 Acquired absence of other organs: Secondary | ICD-10-CM | POA: Diagnosis not present

## 2020-03-01 DIAGNOSIS — C19 Malignant neoplasm of rectosigmoid junction: Secondary | ICD-10-CM | POA: Diagnosis not present

## 2020-03-01 DIAGNOSIS — C787 Secondary malignant neoplasm of liver and intrahepatic bile duct: Secondary | ICD-10-CM | POA: Diagnosis not present

## 2020-03-01 DIAGNOSIS — C189 Malignant neoplasm of colon, unspecified: Secondary | ICD-10-CM | POA: Diagnosis not present

## 2020-03-06 DIAGNOSIS — Z23 Encounter for immunization: Secondary | ICD-10-CM | POA: Diagnosis not present

## 2020-03-11 DIAGNOSIS — C189 Malignant neoplasm of colon, unspecified: Secondary | ICD-10-CM | POA: Diagnosis not present

## 2020-03-11 DIAGNOSIS — C19 Malignant neoplasm of rectosigmoid junction: Secondary | ICD-10-CM | POA: Diagnosis not present

## 2020-03-11 DIAGNOSIS — C187 Malignant neoplasm of sigmoid colon: Secondary | ICD-10-CM | POA: Diagnosis not present

## 2020-03-11 DIAGNOSIS — C787 Secondary malignant neoplasm of liver and intrahepatic bile duct: Secondary | ICD-10-CM | POA: Diagnosis not present

## 2020-03-12 DIAGNOSIS — C19 Malignant neoplasm of rectosigmoid junction: Secondary | ICD-10-CM | POA: Diagnosis not present

## 2020-03-12 DIAGNOSIS — C189 Malignant neoplasm of colon, unspecified: Secondary | ICD-10-CM | POA: Diagnosis not present

## 2020-03-13 DIAGNOSIS — C189 Malignant neoplasm of colon, unspecified: Secondary | ICD-10-CM | POA: Diagnosis not present

## 2020-03-13 DIAGNOSIS — C19 Malignant neoplasm of rectosigmoid junction: Secondary | ICD-10-CM | POA: Diagnosis not present

## 2020-03-23 DIAGNOSIS — H5213 Myopia, bilateral: Secondary | ICD-10-CM | POA: Diagnosis not present

## 2020-03-25 DIAGNOSIS — C772 Secondary and unspecified malignant neoplasm of intra-abdominal lymph nodes: Secondary | ICD-10-CM | POA: Diagnosis not present

## 2020-03-25 DIAGNOSIS — C187 Malignant neoplasm of sigmoid colon: Secondary | ICD-10-CM | POA: Diagnosis not present

## 2020-03-25 DIAGNOSIS — C787 Secondary malignant neoplasm of liver and intrahepatic bile duct: Secondary | ICD-10-CM | POA: Diagnosis not present

## 2020-03-25 DIAGNOSIS — Z5111 Encounter for antineoplastic chemotherapy: Secondary | ICD-10-CM | POA: Diagnosis not present

## 2020-03-25 DIAGNOSIS — Z79899 Other long term (current) drug therapy: Secondary | ICD-10-CM | POA: Diagnosis not present

## 2020-03-25 DIAGNOSIS — C189 Malignant neoplasm of colon, unspecified: Secondary | ICD-10-CM | POA: Diagnosis not present

## 2020-03-25 DIAGNOSIS — C19 Malignant neoplasm of rectosigmoid junction: Secondary | ICD-10-CM | POA: Diagnosis not present

## 2020-03-25 DIAGNOSIS — Z451 Encounter for adjustment and management of infusion pump: Secondary | ICD-10-CM | POA: Diagnosis not present

## 2020-03-26 DIAGNOSIS — C19 Malignant neoplasm of rectosigmoid junction: Secondary | ICD-10-CM | POA: Diagnosis not present

## 2020-03-26 DIAGNOSIS — C189 Malignant neoplasm of colon, unspecified: Secondary | ICD-10-CM | POA: Diagnosis not present

## 2020-04-08 DIAGNOSIS — C189 Malignant neoplasm of colon, unspecified: Secondary | ICD-10-CM | POA: Diagnosis not present

## 2020-04-08 DIAGNOSIS — C187 Malignant neoplasm of sigmoid colon: Secondary | ICD-10-CM | POA: Diagnosis not present

## 2020-04-08 DIAGNOSIS — C787 Secondary malignant neoplasm of liver and intrahepatic bile duct: Secondary | ICD-10-CM | POA: Diagnosis not present

## 2020-04-08 DIAGNOSIS — Z9049 Acquired absence of other specified parts of digestive tract: Secondary | ICD-10-CM | POA: Diagnosis not present

## 2020-04-08 DIAGNOSIS — C19 Malignant neoplasm of rectosigmoid junction: Secondary | ICD-10-CM | POA: Diagnosis not present

## 2020-04-09 DIAGNOSIS — C19 Malignant neoplasm of rectosigmoid junction: Secondary | ICD-10-CM | POA: Diagnosis not present

## 2020-04-09 DIAGNOSIS — C189 Malignant neoplasm of colon, unspecified: Secondary | ICD-10-CM | POA: Diagnosis not present

## 2020-04-10 DIAGNOSIS — C189 Malignant neoplasm of colon, unspecified: Secondary | ICD-10-CM | POA: Diagnosis not present

## 2020-04-10 DIAGNOSIS — C19 Malignant neoplasm of rectosigmoid junction: Secondary | ICD-10-CM | POA: Diagnosis not present

## 2020-04-22 DIAGNOSIS — C19 Malignant neoplasm of rectosigmoid junction: Secondary | ICD-10-CM | POA: Diagnosis not present

## 2020-04-22 DIAGNOSIS — Z9049 Acquired absence of other specified parts of digestive tract: Secondary | ICD-10-CM | POA: Diagnosis not present

## 2020-04-22 DIAGNOSIS — D120 Benign neoplasm of cecum: Secondary | ICD-10-CM | POA: Diagnosis not present

## 2020-04-22 DIAGNOSIS — C189 Malignant neoplasm of colon, unspecified: Secondary | ICD-10-CM | POA: Diagnosis not present

## 2020-04-22 DIAGNOSIS — C787 Secondary malignant neoplasm of liver and intrahepatic bile duct: Secondary | ICD-10-CM | POA: Diagnosis not present

## 2020-04-22 DIAGNOSIS — R7401 Elevation of levels of liver transaminase levels: Secondary | ICD-10-CM | POA: Diagnosis not present

## 2020-04-22 DIAGNOSIS — C187 Malignant neoplasm of sigmoid colon: Secondary | ICD-10-CM | POA: Diagnosis not present

## 2020-04-22 DIAGNOSIS — R599 Enlarged lymph nodes, unspecified: Secondary | ICD-10-CM | POA: Diagnosis not present

## 2020-04-23 DIAGNOSIS — C189 Malignant neoplasm of colon, unspecified: Secondary | ICD-10-CM | POA: Diagnosis not present

## 2020-04-23 DIAGNOSIS — C19 Malignant neoplasm of rectosigmoid junction: Secondary | ICD-10-CM | POA: Diagnosis not present

## 2020-04-24 DIAGNOSIS — C189 Malignant neoplasm of colon, unspecified: Secondary | ICD-10-CM | POA: Diagnosis not present

## 2020-04-24 DIAGNOSIS — C19 Malignant neoplasm of rectosigmoid junction: Secondary | ICD-10-CM | POA: Diagnosis not present

## 2020-05-06 DIAGNOSIS — Z5111 Encounter for antineoplastic chemotherapy: Secondary | ICD-10-CM | POA: Diagnosis not present

## 2020-05-06 DIAGNOSIS — C772 Secondary and unspecified malignant neoplasm of intra-abdominal lymph nodes: Secondary | ICD-10-CM | POA: Diagnosis not present

## 2020-05-06 DIAGNOSIS — C189 Malignant neoplasm of colon, unspecified: Secondary | ICD-10-CM | POA: Diagnosis not present

## 2020-05-06 DIAGNOSIS — C187 Malignant neoplasm of sigmoid colon: Secondary | ICD-10-CM | POA: Diagnosis not present

## 2020-05-06 DIAGNOSIS — C787 Secondary malignant neoplasm of liver and intrahepatic bile duct: Secondary | ICD-10-CM | POA: Diagnosis not present

## 2020-05-06 DIAGNOSIS — C19 Malignant neoplasm of rectosigmoid junction: Secondary | ICD-10-CM | POA: Diagnosis not present

## 2020-05-07 DIAGNOSIS — C189 Malignant neoplasm of colon, unspecified: Secondary | ICD-10-CM | POA: Diagnosis not present

## 2020-05-07 DIAGNOSIS — C19 Malignant neoplasm of rectosigmoid junction: Secondary | ICD-10-CM | POA: Diagnosis not present

## 2020-05-08 DIAGNOSIS — C189 Malignant neoplasm of colon, unspecified: Secondary | ICD-10-CM | POA: Diagnosis not present

## 2020-05-08 DIAGNOSIS — C19 Malignant neoplasm of rectosigmoid junction: Secondary | ICD-10-CM | POA: Diagnosis not present

## 2020-05-20 DIAGNOSIS — C189 Malignant neoplasm of colon, unspecified: Secondary | ICD-10-CM | POA: Diagnosis not present

## 2020-05-20 DIAGNOSIS — C19 Malignant neoplasm of rectosigmoid junction: Secondary | ICD-10-CM | POA: Diagnosis not present

## 2020-05-21 DIAGNOSIS — C19 Malignant neoplasm of rectosigmoid junction: Secondary | ICD-10-CM | POA: Diagnosis not present

## 2020-05-21 DIAGNOSIS — C189 Malignant neoplasm of colon, unspecified: Secondary | ICD-10-CM | POA: Diagnosis not present

## 2020-05-22 DIAGNOSIS — C189 Malignant neoplasm of colon, unspecified: Secondary | ICD-10-CM | POA: Diagnosis not present

## 2020-05-22 DIAGNOSIS — C19 Malignant neoplasm of rectosigmoid junction: Secondary | ICD-10-CM | POA: Diagnosis not present

## 2020-06-03 DIAGNOSIS — C787 Secondary malignant neoplasm of liver and intrahepatic bile duct: Secondary | ICD-10-CM | POA: Diagnosis not present

## 2020-06-03 DIAGNOSIS — C189 Malignant neoplasm of colon, unspecified: Secondary | ICD-10-CM | POA: Diagnosis not present

## 2020-06-03 DIAGNOSIS — Z5111 Encounter for antineoplastic chemotherapy: Secondary | ICD-10-CM | POA: Diagnosis not present

## 2020-06-03 DIAGNOSIS — R599 Enlarged lymph nodes, unspecified: Secondary | ICD-10-CM | POA: Diagnosis not present

## 2020-06-03 DIAGNOSIS — Z79899 Other long term (current) drug therapy: Secondary | ICD-10-CM | POA: Diagnosis not present

## 2020-06-03 DIAGNOSIS — Z451 Encounter for adjustment and management of infusion pump: Secondary | ICD-10-CM | POA: Diagnosis not present

## 2020-06-03 DIAGNOSIS — C779 Secondary and unspecified malignant neoplasm of lymph node, unspecified: Secondary | ICD-10-CM | POA: Diagnosis not present

## 2020-06-03 DIAGNOSIS — C19 Malignant neoplasm of rectosigmoid junction: Secondary | ICD-10-CM | POA: Diagnosis not present

## 2020-06-04 DIAGNOSIS — C189 Malignant neoplasm of colon, unspecified: Secondary | ICD-10-CM | POA: Diagnosis not present

## 2020-06-04 DIAGNOSIS — C19 Malignant neoplasm of rectosigmoid junction: Secondary | ICD-10-CM | POA: Diagnosis not present

## 2020-06-14 ENCOUNTER — Telehealth: Payer: Self-pay | Admitting: Oncology

## 2020-06-14 ENCOUNTER — Encounter: Payer: Self-pay | Admitting: *Deleted

## 2020-06-14 NOTE — Progress Notes (Signed)
Per Dr. Benay Spice: Needs f/u OV late October or early November. Scheduling message sent.

## 2020-06-14 NOTE — Telephone Encounter (Signed)
Scheduled appointment per 7/26 provider message. Patient is aware of appointment date and time.

## 2020-06-17 DIAGNOSIS — C787 Secondary malignant neoplasm of liver and intrahepatic bile duct: Secondary | ICD-10-CM | POA: Diagnosis not present

## 2020-06-17 DIAGNOSIS — C187 Malignant neoplasm of sigmoid colon: Secondary | ICD-10-CM | POA: Diagnosis not present

## 2020-06-17 DIAGNOSIS — C19 Malignant neoplasm of rectosigmoid junction: Secondary | ICD-10-CM | POA: Diagnosis not present

## 2020-06-17 DIAGNOSIS — B349 Viral infection, unspecified: Secondary | ICD-10-CM | POA: Diagnosis not present

## 2020-06-17 DIAGNOSIS — R7401 Elevation of levels of liver transaminase levels: Secondary | ICD-10-CM | POA: Diagnosis not present

## 2020-06-17 DIAGNOSIS — C772 Secondary and unspecified malignant neoplasm of intra-abdominal lymph nodes: Secondary | ICD-10-CM | POA: Diagnosis not present

## 2020-06-17 DIAGNOSIS — Z20822 Contact with and (suspected) exposure to covid-19: Secondary | ICD-10-CM | POA: Diagnosis not present

## 2020-06-17 DIAGNOSIS — C189 Malignant neoplasm of colon, unspecified: Secondary | ICD-10-CM | POA: Diagnosis not present

## 2020-06-18 DIAGNOSIS — C19 Malignant neoplasm of rectosigmoid junction: Secondary | ICD-10-CM | POA: Diagnosis not present

## 2020-06-18 DIAGNOSIS — C189 Malignant neoplasm of colon, unspecified: Secondary | ICD-10-CM | POA: Diagnosis not present

## 2020-07-01 DIAGNOSIS — C787 Secondary malignant neoplasm of liver and intrahepatic bile duct: Secondary | ICD-10-CM | POA: Diagnosis not present

## 2020-07-01 DIAGNOSIS — R7989 Other specified abnormal findings of blood chemistry: Secondary | ICD-10-CM | POA: Diagnosis not present

## 2020-07-01 DIAGNOSIS — C187 Malignant neoplasm of sigmoid colon: Secondary | ICD-10-CM | POA: Diagnosis not present

## 2020-07-01 DIAGNOSIS — R7401 Elevation of levels of liver transaminase levels: Secondary | ICD-10-CM | POA: Diagnosis not present

## 2020-07-01 DIAGNOSIS — R748 Abnormal levels of other serum enzymes: Secondary | ICD-10-CM | POA: Diagnosis not present

## 2020-07-01 DIAGNOSIS — C19 Malignant neoplasm of rectosigmoid junction: Secondary | ICD-10-CM | POA: Diagnosis not present

## 2020-07-01 DIAGNOSIS — C189 Malignant neoplasm of colon, unspecified: Secondary | ICD-10-CM | POA: Diagnosis not present

## 2020-07-01 DIAGNOSIS — Z451 Encounter for adjustment and management of infusion pump: Secondary | ICD-10-CM | POA: Diagnosis not present

## 2020-07-01 DIAGNOSIS — Z5111 Encounter for antineoplastic chemotherapy: Secondary | ICD-10-CM | POA: Diagnosis not present

## 2020-07-02 DIAGNOSIS — C19 Malignant neoplasm of rectosigmoid junction: Secondary | ICD-10-CM | POA: Diagnosis not present

## 2020-07-02 DIAGNOSIS — C189 Malignant neoplasm of colon, unspecified: Secondary | ICD-10-CM | POA: Diagnosis not present

## 2020-07-15 DIAGNOSIS — C799 Secondary malignant neoplasm of unspecified site: Secondary | ICD-10-CM | POA: Diagnosis not present

## 2020-07-15 DIAGNOSIS — Z79899 Other long term (current) drug therapy: Secondary | ICD-10-CM | POA: Diagnosis not present

## 2020-07-15 DIAGNOSIS — C189 Malignant neoplasm of colon, unspecified: Secondary | ICD-10-CM | POA: Diagnosis not present

## 2020-07-15 DIAGNOSIS — C19 Malignant neoplasm of rectosigmoid junction: Secondary | ICD-10-CM | POA: Diagnosis not present

## 2020-07-15 DIAGNOSIS — Z451 Encounter for adjustment and management of infusion pump: Secondary | ICD-10-CM | POA: Diagnosis not present

## 2020-07-15 DIAGNOSIS — Z86010 Personal history of colonic polyps: Secondary | ICD-10-CM | POA: Diagnosis not present

## 2020-07-15 DIAGNOSIS — Z85038 Personal history of other malignant neoplasm of large intestine: Secondary | ICD-10-CM | POA: Diagnosis not present

## 2020-07-15 DIAGNOSIS — Z23 Encounter for immunization: Secondary | ICD-10-CM | POA: Diagnosis not present

## 2020-07-15 DIAGNOSIS — K625 Hemorrhage of anus and rectum: Secondary | ICD-10-CM | POA: Diagnosis not present

## 2020-07-15 DIAGNOSIS — R933 Abnormal findings on diagnostic imaging of other parts of digestive tract: Secondary | ICD-10-CM | POA: Diagnosis not present

## 2020-07-16 DIAGNOSIS — C19 Malignant neoplasm of rectosigmoid junction: Secondary | ICD-10-CM | POA: Diagnosis not present

## 2020-07-16 DIAGNOSIS — C189 Malignant neoplasm of colon, unspecified: Secondary | ICD-10-CM | POA: Diagnosis not present

## 2020-07-23 DIAGNOSIS — K625 Hemorrhage of anus and rectum: Secondary | ICD-10-CM | POA: Diagnosis not present

## 2020-07-23 DIAGNOSIS — Z85038 Personal history of other malignant neoplasm of large intestine: Secondary | ICD-10-CM | POA: Diagnosis not present

## 2020-07-23 DIAGNOSIS — K6289 Other specified diseases of anus and rectum: Secondary | ICD-10-CM | POA: Diagnosis not present

## 2020-07-23 DIAGNOSIS — Z1211 Encounter for screening for malignant neoplasm of colon: Secondary | ICD-10-CM | POA: Diagnosis not present

## 2020-07-28 DIAGNOSIS — C19 Malignant neoplasm of rectosigmoid junction: Secondary | ICD-10-CM | POA: Diagnosis not present

## 2020-07-28 DIAGNOSIS — C189 Malignant neoplasm of colon, unspecified: Secondary | ICD-10-CM | POA: Diagnosis not present

## 2020-07-29 ENCOUNTER — Ambulatory Visit: Payer: BC Managed Care – PPO | Admitting: Oncology

## 2020-07-29 ENCOUNTER — Other Ambulatory Visit: Payer: BC Managed Care – PPO

## 2020-07-29 DIAGNOSIS — C19 Malignant neoplasm of rectosigmoid junction: Secondary | ICD-10-CM | POA: Diagnosis not present

## 2020-07-29 DIAGNOSIS — Z5111 Encounter for antineoplastic chemotherapy: Secondary | ICD-10-CM | POA: Diagnosis not present

## 2020-07-29 DIAGNOSIS — D120 Benign neoplasm of cecum: Secondary | ICD-10-CM | POA: Diagnosis not present

## 2020-07-29 DIAGNOSIS — R599 Enlarged lymph nodes, unspecified: Secondary | ICD-10-CM | POA: Diagnosis not present

## 2020-07-29 DIAGNOSIS — C187 Malignant neoplasm of sigmoid colon: Secondary | ICD-10-CM | POA: Diagnosis not present

## 2020-07-29 DIAGNOSIS — C189 Malignant neoplasm of colon, unspecified: Secondary | ICD-10-CM | POA: Diagnosis not present

## 2020-07-29 DIAGNOSIS — R7401 Elevation of levels of liver transaminase levels: Secondary | ICD-10-CM | POA: Diagnosis not present

## 2020-07-29 DIAGNOSIS — Z23 Encounter for immunization: Secondary | ICD-10-CM | POA: Diagnosis not present

## 2020-07-29 DIAGNOSIS — C787 Secondary malignant neoplasm of liver and intrahepatic bile duct: Secondary | ICD-10-CM | POA: Diagnosis not present

## 2020-08-12 DIAGNOSIS — C787 Secondary malignant neoplasm of liver and intrahepatic bile duct: Secondary | ICD-10-CM | POA: Diagnosis not present

## 2020-08-12 DIAGNOSIS — Z5111 Encounter for antineoplastic chemotherapy: Secondary | ICD-10-CM | POA: Diagnosis not present

## 2020-08-12 DIAGNOSIS — C187 Malignant neoplasm of sigmoid colon: Secondary | ICD-10-CM | POA: Diagnosis not present

## 2020-08-12 DIAGNOSIS — Z79899 Other long term (current) drug therapy: Secondary | ICD-10-CM | POA: Diagnosis not present

## 2020-08-12 DIAGNOSIS — Z451 Encounter for adjustment and management of infusion pump: Secondary | ICD-10-CM | POA: Diagnosis not present

## 2020-08-12 DIAGNOSIS — C19 Malignant neoplasm of rectosigmoid junction: Secondary | ICD-10-CM | POA: Diagnosis not present

## 2020-08-12 DIAGNOSIS — C189 Malignant neoplasm of colon, unspecified: Secondary | ICD-10-CM | POA: Diagnosis not present

## 2020-08-13 DIAGNOSIS — C19 Malignant neoplasm of rectosigmoid junction: Secondary | ICD-10-CM | POA: Diagnosis not present

## 2020-08-13 DIAGNOSIS — C189 Malignant neoplasm of colon, unspecified: Secondary | ICD-10-CM | POA: Diagnosis not present

## 2020-09-09 DIAGNOSIS — Z451 Encounter for adjustment and management of infusion pump: Secondary | ICD-10-CM | POA: Diagnosis not present

## 2020-09-09 DIAGNOSIS — C19 Malignant neoplasm of rectosigmoid junction: Secondary | ICD-10-CM | POA: Diagnosis not present

## 2020-09-09 DIAGNOSIS — C187 Malignant neoplasm of sigmoid colon: Secondary | ICD-10-CM | POA: Diagnosis not present

## 2020-09-09 DIAGNOSIS — C787 Secondary malignant neoplasm of liver and intrahepatic bile duct: Secondary | ICD-10-CM | POA: Diagnosis not present

## 2020-09-13 ENCOUNTER — Encounter: Payer: Self-pay | Admitting: *Deleted

## 2020-09-13 NOTE — Progress Notes (Signed)
Per Dr. Benay Spice: Needs OV and HAI pump flush on 10/07/20. Scheduling message sent and notified [facility] to arrange w/the representative of pump to be here for training of staff.

## 2020-09-21 ENCOUNTER — Other Ambulatory Visit: Payer: Self-pay

## 2020-09-21 ENCOUNTER — Inpatient Hospital Stay: Payer: BC Managed Care – PPO | Attending: Oncology | Admitting: Oncology

## 2020-09-21 VITALS — BP 113/81 | HR 68 | Temp 97.0°F | Resp 17 | Ht 68.0 in | Wt 146.5 lb

## 2020-09-21 DIAGNOSIS — Z800 Family history of malignant neoplasm of digestive organs: Secondary | ICD-10-CM | POA: Diagnosis not present

## 2020-09-21 DIAGNOSIS — Z452 Encounter for adjustment and management of vascular access device: Secondary | ICD-10-CM | POA: Diagnosis not present

## 2020-09-21 DIAGNOSIS — Z9221 Personal history of antineoplastic chemotherapy: Secondary | ICD-10-CM | POA: Insufficient documentation

## 2020-09-21 DIAGNOSIS — C187 Malignant neoplasm of sigmoid colon: Secondary | ICD-10-CM | POA: Diagnosis not present

## 2020-09-21 DIAGNOSIS — Z85038 Personal history of other malignant neoplasm of large intestine: Secondary | ICD-10-CM | POA: Diagnosis not present

## 2020-09-21 DIAGNOSIS — Z23 Encounter for immunization: Secondary | ICD-10-CM | POA: Insufficient documentation

## 2020-09-21 DIAGNOSIS — Z803 Family history of malignant neoplasm of breast: Secondary | ICD-10-CM | POA: Diagnosis not present

## 2020-09-21 MED ORDER — INFLUENZA VAC SPLIT QUAD 0.5 ML IM SUSY
PREFILLED_SYRINGE | INTRAMUSCULAR | Status: AC
  Filled 2020-09-21: qty 0.5

## 2020-09-21 MED ORDER — INFLUENZA VAC SPLIT QUAD 0.5 ML IM SUSY
0.5000 mL | PREFILLED_SYRINGE | Freq: Once | INTRAMUSCULAR | Status: AC
  Administered 2020-09-21: 0.5 mL via INTRAMUSCULAR

## 2020-09-21 NOTE — Progress Notes (Signed)
[place] OFFICE PROGRESS NOTE   Diagnosis: Colon cancer  INTERVAL HISTORY:   Ms. Cockerell returns for a scheduled visit.  Underwent resection of a right hepatic lesion on 01/28/2020.  This was followed by 6 months of infusional 5-FU/FUDR via a hepatic infusion pump.  She completed treatment last month.  She has transitioned her care back to [facility].  She feels well.  No complaint.  She reports a skin nodule at the right neck has been present for many months.  No mouth sores, diarrhea, or hand/foot pain with the 5-FU.  Objective:  Vital signs in last 24 hours:  Blood pressure 113/81, pulse 68, temperature (!) 97 F (36.1 C), temperature source Tympanic, resp. rate 17, height '5\' 8"'  (1.727 m), weight 146 lb 8 oz (66.5 kg), last menstrual period 04/20/2013, SpO2 98 %.    Lymphatics: No cervical, supraclavicular, or axillary nodes Resp: Lungs clear bilaterally Cardio: Regular rate and rhythm GI: No hepatomegaly, no mass, left abdomen infusion pump Vascular: No leg edema  Skin: 1 cm mobile cutaneous nodular lesion at the right mid anterior neck  Portacath/PICC-without erythema  Lab Results:  Lab Results  Component Value Date   WBC 3.9 (L) 08/14/2018   HGB 14.1 08/14/2018   HCT 41.5 08/14/2018   MCV 91.4 08/14/2018   PLT 166 08/14/2018   NEUTROABS 2.4 04/10/2017    CMP  Lab Results  Component Value Date   NA 141 01/27/2020   K 4.4 01/27/2020   CL 108 01/27/2020   CO2 24 01/27/2020   GLUCOSE 87 01/27/2020   BUN 13 01/27/2020   CREATININE 0.85 01/27/2020   CALCIUM 9.5 01/27/2020   PROT 6.5 01/30/2017   ALBUMIN 3.7 01/30/2017   AST 27 01/30/2017   ALT 16 01/30/2017   ALKPHOS 126 01/30/2017   BILITOT 0.41 01/30/2017   GFRNONAA >60 01/27/2020   GFRAA >60 01/27/2020    Lab Results  Component Value Date   CEA1 1.24 01/27/2020    Medications: I have reviewed the patient's current medications.   Assessment/Plan:  1.Adenocarcinoma of the sigmoid  colon, status post a colonoscopic biopsy 07/07/2016  Mismatch repair protein expression-normal  Sigmoid mass noted at 20 cm from the anal verge  Staging CTs of the chest, abdomen, and pelvis negative for evidence of metastatic disease other than an indeterminate 10 mm right liver lesion  MRI of the abdomen 07/07/2016 revealed multiple bilateral hepatic cyst. 7 mm inferior subcapsular right liver lesion suspicious for a metastasis  PET scan 07/21/2016-hypermetabolic sigmoid colon mass, mildly hypermetabolic segment 6 liver lesion  Laparoscopic assisted rectosigmoid colectomy and segment 8 liver resection 07/27/2016, pathology confirmed a T3, N2b, M1tumor, 11/16 lymph nodes positive for metastatic carcinoma  Foundation 1 testing found the tumor to be microsatellite stable, tumor mutation burden low, no BRAFor RAS mutation  No loss of mismatch repair protein expression, MSI-stable  Cycle 1 CAPOX 08/22/2016  Cycle 2 CAPOX 09/12/2016  Cycle 3 CAPOX 10/03/2016 (Xeloda dose reduced to 3 tablets every morning and 2 tablets every evening beginning with cycle 3)  Cycle 4 CAPOX 10/31/2016 (Xeloda re-escalated to original dose)  Cycle 5 CAPOX 11/21/2016  Cycle 6 CAPOX 12/12/2016  Cycle 7 CAPOX 01/09/2017 (oxaliplatin held)  Cycle 8 CAPOX 01/30/2017 (oxaliplatin dose reduced)  CT scans 02/27/2017-negative for recurrent colon cancer  CTs 08/14/2017-negative for recurrent cancer  CTs 02/12/2018-negative for recurrent cancer  CTs 07/29/2018-negative for recurrent cancer   CTs 01/27/2019-negative for recurrent cancer, new 8 mm subpleural left lower lobe  nodule  CT chest 04/29/2019- resolution of left lower lobe nodule  CT 07/29/2019- negative for recurrent cancer  CTs 01/27/2020-stable 4 mm right apical lung nodule.  New 1.6 x 1.6 cm lesion identified in the lateral segment left liver.  Stable 9 mm subcapsular lesion interpolar left kidney.  MRI liver 3/10/ 2021-15 mm enhancing lesion in  segment 3, new from September 2020, subcentimeter hemorrhagic cyst in the left kidney  Robotic assisted partial hepatectomy (segment 3), cholecystectomy, portal lymphadenectomy, and insertion of hepatic infusion pump 02/12/2020, 0/3 nodes, 1.9 cm adenocarcinoma in segment 3, negative margins  03/11/2020-FUDR and infusional 5-FU #1  04/08/2020-FUDR #2  05/06/2020-FUDR#3  06/03/2020-FUDR#4 canceled secondary to elevation of the alkaline phosphatase  07/15/2020-FUDR#4, 50% dose reduction  09/09/2000-CTs chest/abdomen/pelvis-negative  2. History of rectal bleeding secondary to #1  3. Multiple polyps on the colonoscopy 07/07/2016  Admission 07/13/2016 with acute GI bleeding secondary to an ulcer at the cecum  Colonoscopy 10/08/2017-small polyps removed from the cecum-colonic mucosa with lymphoid aggregates-no dysplasia or malignancy  4. Family history of breast and pancreas cancer  5. Delayed nausea following cycle 1 CAPOX-emend added with cycle 2  6. Diarrhea following cycle 2 and cycle 6 CAPOX  7. Neutropenia/thrombocytopenia secondary to chemotherapy  8. History of mild oxaliplatin neuropathy- resolved   Disposition: Ms. Moncayo is in clinical remission from colon cancer.  She completed 6 months of adjuvant infusional 5-FU and hepatic arterial FUDR after undergoing resection of a segment 3 lesion.  She will be followed with observation.  She will return for a Port-A-Cath and hepatic infusion pump flush on a monthly schedule.  She will be scheduled for an office visit and restaging CTs on 12/09/2020.  The right neck skin lesion is likely a benign cyst.  She will contact us if the lesion enlarges.  Betsy Coder, MD  09/21/2020  9:24 AM

## 2020-09-22 ENCOUNTER — Telehealth: Payer: Self-pay | Admitting: Oncology

## 2020-09-22 NOTE — Telephone Encounter (Signed)
Appointments scheduled per 11/2 los. Spoke to patient who is unsure of the appointments scheduled for the pump flushes. She said she would e-mail RN Manuela Schwartz with schedule that she believes is correct. I will send a message for Dr. Benay Spice and RN Manuela Schwartz with patient's response so they are aware.

## 2020-09-28 ENCOUNTER — Other Ambulatory Visit: Payer: Self-pay | Admitting: Obstetrics and Gynecology

## 2020-09-28 DIAGNOSIS — Z1231 Encounter for screening mammogram for malignant neoplasm of breast: Secondary | ICD-10-CM

## 2020-09-29 ENCOUNTER — Other Ambulatory Visit: Payer: Self-pay

## 2020-09-29 ENCOUNTER — Ambulatory Visit
Admission: RE | Admit: 2020-09-29 | Discharge: 2020-09-29 | Disposition: A | Discharge status: Home / Self Care | Payer: BC Managed Care – PPO | Source: Ambulatory Visit | Attending: Obstetrics and Gynecology | Admitting: Obstetrics and Gynecology

## 2020-09-29 ENCOUNTER — Other Ambulatory Visit: Payer: Self-pay | Admitting: *Deleted

## 2020-09-29 DIAGNOSIS — Z1231 Encounter for screening mammogram for malignant neoplasm of breast: Secondary | ICD-10-CM | POA: Diagnosis not present

## 2020-10-07 ENCOUNTER — Inpatient Hospital Stay: Payer: BC Managed Care – PPO

## 2020-10-07 ENCOUNTER — Other Ambulatory Visit: Payer: Self-pay

## 2020-10-07 ENCOUNTER — Inpatient Hospital Stay (HOSPITAL_BASED_OUTPATIENT_CLINIC_OR_DEPARTMENT_OTHER): Payer: BC Managed Care – PPO | Admitting: Oncology

## 2020-10-07 VITALS — BP 117/86 | HR 96 | Temp 97.7°F | Resp 15 | Ht 68.0 in | Wt 147.7 lb

## 2020-10-07 DIAGNOSIS — Z9221 Personal history of antineoplastic chemotherapy: Secondary | ICD-10-CM | POA: Diagnosis not present

## 2020-10-07 DIAGNOSIS — Z803 Family history of malignant neoplasm of breast: Secondary | ICD-10-CM | POA: Diagnosis not present

## 2020-10-07 DIAGNOSIS — Z85038 Personal history of other malignant neoplasm of large intestine: Secondary | ICD-10-CM | POA: Diagnosis not present

## 2020-10-07 DIAGNOSIS — Z23 Encounter for immunization: Secondary | ICD-10-CM | POA: Diagnosis not present

## 2020-10-07 DIAGNOSIS — Z452 Encounter for adjustment and management of vascular access device: Secondary | ICD-10-CM | POA: Diagnosis not present

## 2020-10-07 DIAGNOSIS — Z800 Family history of malignant neoplasm of digestive organs: Secondary | ICD-10-CM | POA: Diagnosis not present

## 2020-10-07 DIAGNOSIS — C187 Malignant neoplasm of sigmoid colon: Secondary | ICD-10-CM

## 2020-10-07 DIAGNOSIS — Z95828 Presence of other vascular implants and grafts: Secondary | ICD-10-CM

## 2020-10-07 MED ORDER — SODIUM CHLORIDE (PF) 0.9 % IJ SOLN
Freq: Once | INTRAMUSCULAR | Status: DC
  Filled 2020-10-07 (×2): qty 5

## 2020-10-07 MED ORDER — SODIUM CHLORIDE 0.9% FLUSH
10.0000 mL | INTRAVENOUS | Status: DC | PRN
  Administered 2020-10-07: 10 mL via INTRAVENOUS
  Filled 2020-10-07: qty 10

## 2020-10-07 MED ORDER — HEPARIN SOD (PORK) LOCK FLUSH 100 UNIT/ML IV SOLN
500.0000 [IU] | Freq: Once | INTRAVENOUS | Status: AC | PRN
  Administered 2020-10-07: 500 [IU] via INTRAVENOUS
  Filled 2020-10-07: qty 5

## 2020-10-07 MED ORDER — SODIUM CHLORIDE (PF) 0.9 % IJ SOLN
25000.0000 [IU] | Freq: Once | INTRAMUSCULAR | Status: AC
  Administered 2020-10-07: 25000 [IU]
  Filled 2020-10-07: qty 5

## 2020-10-07 NOTE — Progress Notes (Signed)
Mrs. Wenk came in today to have her [employer] hepatic pump interrogated and refilled with a heparin and saline 20 mL flush prepared by pharmacy.  Merceda Elks, RN attempted first and Rodney Langton, RN4 attempted secondarily.  Third attempt was by me and I was able to puncture the small reservoir port.  Rodney Langton then instilled the heparin solution after priming the filter.  A total of 19 mL instilled in the pump.  [employer] vendor rep, Teresa Pelton, with [employer] tablet at bedside communicated with the pump before the procedure and after the procedure.  Pump settings updated and patient will need to come back by December 19 for a withdrawal and heparin/saline installation again.  Patient tolerated the procedure well and was very pleasant.  Sterile procedure technique maintained throughout the procedure.  Gardiner Rhyme, RN

## 2020-10-19 ENCOUNTER — Other Ambulatory Visit: Payer: BC Managed Care – PPO

## 2020-10-19 ENCOUNTER — Ambulatory Visit: Payer: BC Managed Care – PPO

## 2020-11-04 ENCOUNTER — Other Ambulatory Visit: Payer: Self-pay

## 2020-11-04 ENCOUNTER — Inpatient Hospital Stay: Payer: BC Managed Care – PPO | Attending: Oncology

## 2020-11-04 DIAGNOSIS — D509 Iron deficiency anemia, unspecified: Secondary | ICD-10-CM | POA: Insufficient documentation

## 2020-11-04 DIAGNOSIS — C187 Malignant neoplasm of sigmoid colon: Secondary | ICD-10-CM

## 2020-11-04 DIAGNOSIS — Z452 Encounter for adjustment and management of vascular access device: Secondary | ICD-10-CM | POA: Diagnosis present

## 2020-11-04 DIAGNOSIS — Z85038 Personal history of other malignant neoplasm of large intestine: Secondary | ICD-10-CM | POA: Diagnosis not present

## 2020-11-04 DIAGNOSIS — K625 Hemorrhage of anus and rectum: Secondary | ICD-10-CM | POA: Insufficient documentation

## 2020-11-04 DIAGNOSIS — Z8371 Family history of colonic polyps: Secondary | ICD-10-CM | POA: Insufficient documentation

## 2020-11-04 DIAGNOSIS — R933 Abnormal findings on diagnostic imaging of other parts of digestive tract: Secondary | ICD-10-CM | POA: Insufficient documentation

## 2020-11-04 MED ORDER — SODIUM CHLORIDE (PF) 0.9 % IJ SOLN
Freq: Once | INTRAMUSCULAR | Status: DC
  Filled 2020-11-04: qty 5.25

## 2020-11-04 MED ORDER — SODIUM CHLORIDE (PF) 0.9 % IJ SOLN
Freq: Once | INTRAMUSCULAR | Status: DC
  Filled 2020-11-04: qty 5

## 2020-11-04 MED ORDER — SODIUM CHLORIDE (PF) 0.9 % IJ SOLN
Freq: Once | INTRAMUSCULAR | Status: AC
  Filled 2020-11-04: qty 5.25

## 2020-11-04 MED ORDER — SODIUM CHLORIDE (PF) 0.9 % IJ SOLN: Freq: Once | INTRAMUSCULAR | Status: DC

## 2020-11-04 NOTE — Patient Instructions (Signed)
F/U as scheduled on 12/03/19.

## 2020-11-04 NOTE — Progress Notes (Signed)
Accessed [employer] infusion pump in left abdomen using sterile technique and aspirated 2.38ml of residual heparin from port. Instilled 26,250 units of heparin in normal saline for total fill of 21 ml without resistance. Confirmed fill w/Ipad and patient tolerated procedure well. Assisted by Rodney Langton, RN

## 2020-11-16 ENCOUNTER — Ambulatory Visit: Payer: BC Managed Care – PPO

## 2020-11-16 ENCOUNTER — Other Ambulatory Visit: Payer: BC Managed Care – PPO

## 2020-11-18 ENCOUNTER — Other Ambulatory Visit: Payer: Self-pay

## 2020-11-18 ENCOUNTER — Inpatient Hospital Stay: Payer: BC Managed Care – PPO

## 2020-11-18 DIAGNOSIS — Z85038 Personal history of other malignant neoplasm of large intestine: Secondary | ICD-10-CM | POA: Diagnosis not present

## 2020-11-18 DIAGNOSIS — C187 Malignant neoplasm of sigmoid colon: Secondary | ICD-10-CM

## 2020-11-18 DIAGNOSIS — Z95828 Presence of other vascular implants and grafts: Secondary | ICD-10-CM

## 2020-11-18 LAB — CBC WITH DIFFERENTIAL (CANCER CENTER ONLY)
Abs Immature Granulocytes: 0.01 10*3/uL (ref 0.00–0.07)
Basophils Absolute: 0 10*3/uL (ref 0.0–0.1)
Basophils Relative: 1 %
Eosinophils Absolute: 0.1 10*3/uL (ref 0.0–0.5)
Eosinophils Relative: 2 %
HCT: 39.9 % (ref 36.0–46.0)
Hemoglobin: 13.7 g/dL (ref 12.0–15.0)
Immature Granulocytes: 0 %
Lymphocytes Relative: 29 %
Lymphs Abs: 1.1 10*3/uL (ref 0.7–4.0)
MCH: 31.5 pg (ref 26.0–34.0)
MCHC: 34.3 g/dL (ref 30.0–36.0)
MCV: 91.7 fL (ref 80.0–100.0)
Monocytes Absolute: 0.4 10*3/uL (ref 0.1–1.0)
Monocytes Relative: 11 %
Neutro Abs: 2.2 10*3/uL (ref 1.7–7.7)
Neutrophils Relative %: 57 %
Platelet Count: 175 10*3/uL (ref 150–400)
RBC: 4.35 MIL/uL (ref 3.87–5.11)
RDW: 11.7 % (ref 11.5–15.5)
WBC Count: 3.9 10*3/uL — ABNORMAL LOW (ref 4.0–10.5)
nRBC: 0 % (ref 0.0–0.2)

## 2020-11-18 LAB — CMP (CANCER CENTER ONLY)
ALT: 18 U/L (ref 0–44)
AST: 23 U/L (ref 15–41)
Albumin: 4.1 g/dL (ref 3.5–5.0)
Alkaline Phosphatase: 111 U/L (ref 38–126)
Anion gap: 5 (ref 5–15)
BUN: 10 mg/dL (ref 6–20)
CO2: 26 mmol/L (ref 22–32)
Calcium: 9.6 mg/dL (ref 8.9–10.3)
Chloride: 109 mmol/L (ref 98–111)
Creatinine: 0.79 mg/dL (ref 0.44–1.00)
GFR, Estimated: >60 mL/min (ref >60)
Glucose, Bld: 96 mg/dL (ref 70–99)
Potassium: 4.1 mmol/L (ref 3.5–5.1)
Sodium: 140 mmol/L (ref 135–145)
Total Bilirubin: 0.5 mg/dL (ref 0.3–1.2)
Total Protein: 7.2 g/dL (ref 6.5–8.1)

## 2020-11-18 LAB — CEA (IN HOUSE-CHCC): CEA (CHCC-In House): 1.26 ng/mL (ref 0.00–5.00)

## 2020-11-18 MED ORDER — SODIUM CHLORIDE 0.9% FLUSH
10.0000 mL | INTRAVENOUS | Status: DC | PRN
  Administered 2020-11-18: 10 mL via INTRAVENOUS
  Filled 2020-11-18: qty 10

## 2020-11-18 MED ORDER — HEPARIN SOD (PORK) LOCK FLUSH 100 UNIT/ML IV SOLN
500.0000 [IU] | Freq: Once | INTRAVENOUS | Status: AC | PRN
  Administered 2020-11-18: 500 [IU] via INTRAVENOUS
  Filled 2020-11-18: qty 5

## 2020-11-29 ENCOUNTER — Encounter: Payer: Self-pay | Admitting: *Deleted

## 2020-12-02 ENCOUNTER — Ambulatory Visit
Admission: RE | Admit: 2020-12-02 | Discharge: 2020-12-02 | Disposition: A | Discharge status: Home / Self Care | Payer: BC Managed Care – PPO | Source: Ambulatory Visit | Attending: Oncology | Admitting: Oncology

## 2020-12-02 ENCOUNTER — Inpatient Hospital Stay: Payer: BC Managed Care – PPO | Attending: Oncology

## 2020-12-02 ENCOUNTER — Other Ambulatory Visit: Payer: Self-pay

## 2020-12-02 ENCOUNTER — Inpatient Hospital Stay (HOSPITAL_BASED_OUTPATIENT_CLINIC_OR_DEPARTMENT_OTHER): Payer: BC Managed Care – PPO | Admitting: Oncology

## 2020-12-02 ENCOUNTER — Other Ambulatory Visit: Payer: Self-pay | Admitting: *Deleted

## 2020-12-02 VITALS — BP 113/78 | HR 99 | Temp 98.6°F | Resp 18 | Ht 68.0 in | Wt 143.5 lb

## 2020-12-02 DIAGNOSIS — C187 Malignant neoplasm of sigmoid colon: Secondary | ICD-10-CM

## 2020-12-02 DIAGNOSIS — Z95828 Presence of other vascular implants and grafts: Secondary | ICD-10-CM

## 2020-12-02 DIAGNOSIS — Z85038 Personal history of other malignant neoplasm of large intestine: Secondary | ICD-10-CM | POA: Diagnosis not present

## 2020-12-02 DIAGNOSIS — Z803 Family history of malignant neoplasm of breast: Secondary | ICD-10-CM | POA: Diagnosis not present

## 2020-12-02 DIAGNOSIS — Z452 Encounter for adjustment and management of vascular access device: Secondary | ICD-10-CM | POA: Diagnosis not present

## 2020-12-02 DIAGNOSIS — Z800 Family history of malignant neoplasm of digestive organs: Secondary | ICD-10-CM | POA: Diagnosis not present

## 2020-12-02 MED ORDER — SODIUM CHLORIDE (PF) 0.9 % IJ SOLN
Freq: Once | INTRAMUSCULAR | Status: AC
  Filled 2020-12-02: qty 5

## 2020-12-02 MED ORDER — HEPARIN SOD (PORK) LOCK FLUSH 100 UNIT/ML IV SOLN
500.0000 [IU] | Freq: Once | INTRAVENOUS | Status: AC
  Administered 2020-12-02: 500 [IU] via INTRAVENOUS

## 2020-12-02 MED ORDER — IOPAMIDOL (ISOVUE-300) INJECTION 61%
100.0000 mL | Freq: Once | INTRAVENOUS | Status: AC | PRN
  Administered 2020-12-02: 100 mL via INTRAVENOUS

## 2020-12-02 MED ORDER — SODIUM CHLORIDE 0.9% FLUSH
10.0000 mL | INTRAVENOUS | Status: DC | PRN
  Administered 2020-12-02: 10 mL via INTRAVENOUS

## 2020-12-02 NOTE — Progress Notes (Signed)
[place] OFFICE PROGRESS NOTE   Diagnosis: Colon cancer  INTERVAL HISTORY:   Ms. Probert returns as scheduled.  She feels well.  Hepatic infusion pump remains in place.  No complaint.  Objective:  Vital signs in last 24 hours:  Blood pressure 113/78, pulse 99, temperature 98.6 F (37 C), temperature source Tympanic, resp. rate 18, height 5' 8" (1.727 m), weight 143 lb 8 oz (65.1 kg), last menstrual period 04/20/2013, SpO2 98 %.    Lymphatics: No cervical, supraclavicular, axillary, or inguinal nodes Resp: Lungs clear bilaterally Cardio: Regular rate and rhythm GI: No hepatosplenomegaly, left abdominal wall infusion pump Vascular: No leg edema Skin: 1 cm soft mobile cutaneous nodule at the right mid anterior  Portacath/PICC-without erythema  Lab Results:  Lab Results  Component Value Date   WBC 3.9 (L) 11/18/2020   HGB 13.7 11/18/2020   HCT 39.9 11/18/2020   MCV 91.7 11/18/2020   PLT 175 11/18/2020   NEUTROABS 2.2 11/18/2020    CMP  Lab Results  Component Value Date   NA 140 11/18/2020   K 4.1 11/18/2020   CL 109 11/18/2020   CO2 26 11/18/2020   GLUCOSE 96 11/18/2020   BUN 10 11/18/2020   CREATININE 0.79 11/18/2020   CALCIUM 9.6 11/18/2020   PROT 7.2 11/18/2020   ALBUMIN 4.1 11/18/2020   AST 23 11/18/2020   ALT 18 11/18/2020   ALKPHOS 111 11/18/2020   BILITOT 0.5 11/18/2020   GFRNONAA >60 11/18/2020   GFRAA >60 01/27/2020    Lab Results  Component Value Date   CEA1 1.26 11/18/2020     Medications: I have reviewed the patient's current medications.   Assessment/Plan: 1.Adenocarcinoma of the sigmoid colon, status post a colonoscopic biopsy 07/07/2016  Mismatch repair protein expression-normal  Sigmoid mass noted at 20 cm from the anal verge  Staging CTs of the chest, abdomen, and pelvis negative for evidence of metastatic disease other than an indeterminate 10 mm right liver lesion  MRI of the abdomen 07/07/2016 revealed  multiple bilateral hepatic cyst. 7 mm inferior subcapsular right liver lesion suspicious for a metastasis  PET scan 07/21/2016-hypermetabolic sigmoid colon mass, mildly hypermetabolic segment 6 liver lesion  Laparoscopic assisted rectosigmoid colectomy and segment 8 liver resection 07/27/2016, pathology confirmed a T3, N2b, M1tumor, 11/16 lymph nodes positive for metastatic carcinoma  Foundation 1 testing found the tumor to be microsatellite stable, tumor mutation burden low, no BRAFor RAS mutation  No loss of mismatch repair protein expression, MSI-stable  Cycle 1 CAPOX 08/22/2016  Cycle 2 CAPOX 09/12/2016  Cycle 3 CAPOX 10/03/2016 (Xeloda dose reduced to 3 tablets every morning and 2 tablets every evening beginning with cycle 3)  Cycle 4 CAPOX 10/31/2016 (Xeloda re-escalated to original dose)  Cycle 5 CAPOX 11/21/2016  Cycle 6 CAPOX 12/12/2016  Cycle 7 CAPOX 01/09/2017 (oxaliplatin held)  Cycle 8 CAPOX 01/30/2017 (oxaliplatin dose reduced)  CT scans 02/27/2017-negative for recurrent colon cancer  CTs 08/14/2017-negative for recurrent cancer  CTs 02/12/2018-negative for recurrent cancer  CTs 07/29/2018-negative for recurrent cancer   CTs 01/27/2019-negative for recurrent cancer, new 8 mm subpleural left lower lobe nodule  CT chest 04/29/2019- resolution of left lower lobe nodule  CT 07/29/2019- negative for recurrent cancer  CTs 01/27/2020-stable 4 mm right apical lung nodule.  New 1.6 x 1.6 cm lesion identified in the lateral segment left liver.  Stable 9 mm subcapsular lesion interpolar left kidney.  MRI liver 3/10/ 2021-15 mm enhancing lesion in segment 3, new from September  2020, subcentimeter hemorrhagic cyst in the left kidney  Robotic assisted partial hepatectomy (segment 3), cholecystectomy, portal lymphadenectomy, and insertion of hepatic infusion pump 02/12/2020, 0/3 nodes, 1.9 cm adenocarcinoma in segment 3, negative margins  03/11/2020-FUDR and infusional 5-FU  #1  04/08/2020-FUDR #2  05/06/2020-FUDR#3  06/03/2020-FUDR#4 canceled secondary to elevation of the alkaline phosphatase  07/15/2020-FUDR#4, 50% dose reduction  09/09/2000-CTs chest/abdomen/pelvis-negative  12/02/2020- CTs negative for recurrent disease  2. History of rectal bleeding secondary to #1  3. Multiple polyps on the colonoscopy 07/07/2016  Admission 07/13/2016 with acute GI bleeding secondary to an ulcer at the cecum  Colonoscopy 10/08/2017-small polyps removed from the cecum-colonic mucosa with lymphoid aggregates-no dysplasia or malignancy  4. Family history of breast and pancreas cancer  5. Delayed nausea following cycle 1 CAPOX-emend added with cycle 2  6. Diarrhea following cycle 2 and cycle 6 CAPOX  7. Neutropenia/thrombocytopenia secondary to chemotherapy  8. History of mild oxaliplatin neuropathy- resolved    Disposition: Patricia Hudson appears well.  She is in clinical remission from colon cancer.  The restaging CTs are negative for recurrent disease.  She will return for a monthly infusion pump flush.  The Port-A-Cath will be flushed every 8 weeks.  She will be scheduled for an office visit and restaging CTs in 12 weeks.  Betsy Coder, MD  12/02/2020  11:34 AM

## 2020-12-03 ENCOUNTER — Telehealth: Payer: Self-pay | Admitting: Oncology

## 2020-12-03 NOTE — Telephone Encounter (Signed)
Scheduled appointments per 1/13 los. Called patient, no answer and voicemail was full. Mailed updated calendar to patient.

## 2020-12-09 ENCOUNTER — Ambulatory Visit: Payer: BC Managed Care – PPO

## 2020-12-09 ENCOUNTER — Other Ambulatory Visit: Payer: BC Managed Care – PPO

## 2020-12-30 ENCOUNTER — Other Ambulatory Visit: Payer: Self-pay

## 2020-12-30 ENCOUNTER — Inpatient Hospital Stay: Payer: BC Managed Care – PPO | Attending: Oncology

## 2020-12-30 VITALS — BP 128/89 | HR 67 | Temp 98.3°F | Resp 18

## 2020-12-30 DIAGNOSIS — Z95828 Presence of other vascular implants and grafts: Secondary | ICD-10-CM

## 2020-12-30 DIAGNOSIS — C187 Malignant neoplasm of sigmoid colon: Secondary | ICD-10-CM

## 2020-12-30 MED ORDER — SODIUM CHLORIDE (PF) 0.9 % IJ SOLN
Freq: Once | INTRAMUSCULAR | Status: AC
  Filled 2020-12-30: qty 5

## 2020-12-30 MED ORDER — ALTEPLASE 2 MG IJ SOLR
2.0000 mg | Freq: Once | INTRAMUSCULAR | Status: DC | PRN
  Filled 2020-12-30: qty 2

## 2020-12-30 MED ORDER — HEPARIN SOD (PORK) LOCK FLUSH 100 UNIT/ML IV SOLN
500.0000 [IU] | Freq: Once | INTRAVENOUS | Status: DC | PRN
  Filled 2020-12-30: qty 5

## 2020-12-30 MED ORDER — SODIUM CHLORIDE 0.9% FLUSH
10.0000 mL | INTRAVENOUS | Status: DC | PRN
  Filled 2020-12-30: qty 10

## 2020-12-30 NOTE — Progress Notes (Signed)
Accessed Metronic device after abdomen prepped and draped using sterile technique. Aspirated residual volume of 3.2 ml heparin solution and instilled 20 ml of heparin solution. Updated Ipad with next fill date prior to 02/01/21 (12/30/20). Medtronic representative present to assist as needed. Patient tolerated procedure well.

## 2021-01-18 ENCOUNTER — Encounter: Payer: Self-pay | Admitting: *Deleted

## 2021-01-18 NOTE — Progress Notes (Signed)
Patient informed office that her CT scan had been cancelled and she did not request this. She agrees to CT any day before she sees Dr.Sherrill and anywhere. Rescheduled scan for 02/21/21 at 1015/1030 and patient notified via [facility]. Will schedule her lab/flush same day to access port for scan. Kept appointments on 4/4 at the [facility] since scan being done there.

## 2021-01-27 ENCOUNTER — Other Ambulatory Visit: Payer: Self-pay

## 2021-01-27 ENCOUNTER — Inpatient Hospital Stay: Payer: BC Managed Care – PPO | Attending: Oncology

## 2021-01-27 ENCOUNTER — Inpatient Hospital Stay: Payer: BC Managed Care – PPO

## 2021-01-27 VITALS — BP 133/86 | HR 89 | Temp 98.7°F | Resp 18 | Wt 144.0 lb

## 2021-01-27 DIAGNOSIS — Z95828 Presence of other vascular implants and grafts: Secondary | ICD-10-CM

## 2021-01-27 DIAGNOSIS — C187 Malignant neoplasm of sigmoid colon: Secondary | ICD-10-CM | POA: Diagnosis present

## 2021-01-27 DIAGNOSIS — Z452 Encounter for adjustment and management of vascular access device: Secondary | ICD-10-CM | POA: Insufficient documentation

## 2021-01-27 MED ORDER — SODIUM CHLORIDE 0.9% FLUSH
10.0000 mL | INTRAVENOUS | Status: DC | PRN
  Administered 2021-01-27: 10 mL via INTRAVENOUS
  Filled 2021-01-27: qty 10

## 2021-01-27 MED ORDER — SODIUM CHLORIDE (PF) 0.9 % IJ SOLN
Freq: Once | INTRAMUSCULAR | Status: AC
  Filled 2021-01-27: qty 5

## 2021-01-27 MED ORDER — HEPARIN SOD (PORK) LOCK FLUSH 100 UNIT/ML IV SOLN
500.0000 [IU] | Freq: Once | INTRAVENOUS | Status: AC | PRN
  Administered 2021-01-27: 500 [IU] via INTRAVENOUS
  Filled 2021-01-27: qty 5

## 2021-01-27 NOTE — Patient Instructions (Signed)
Implanted Port Insertion, Care After This sheet gives you information about how to care for yourself after your procedure. Your health care provider may also give you more specific instructions. If you have problems or questions, contact your health care provider. What can I expect after the procedure? After the procedure, it is common to have:  Discomfort at the port insertion site.  Bruising on the skin over the port. This should improve over 3-4 days. Follow these instructions at home: Port care  After your port is placed, you will get a manufacturer's information card. The card has information about your port. Keep this card with you at all times.  Take care of the port as told by your health care provider. Ask your health care provider if you or a family member can get training for taking care of the port at home. A home health care nurse may also take care of the port.  Make sure to remember what type of port you have. Incision care  Follow instructions from your health care provider about how to take care of your port insertion site. Make sure you: ? Wash your hands with soap and water before and after you change your bandage (dressing). If soap and water are not available, use hand sanitizer. ? Change your dressing as told by your health care provider. ? Leave stitches (sutures), skin glue, or adhesive strips in place. These skin closures may need to stay in place for 2 weeks or longer. If adhesive strip edges start to loosen and curl up, you may trim the loose edges. Do not remove adhesive strips completely unless your health care provider tells you to do that.  Check your port insertion site every day for signs of infection. Check for: ? Redness, swelling, or pain. ? Fluid or blood. ? Warmth. ? Pus or a bad smell.      Activity  Return to your normal activities as told by your health care provider. Ask your health care provider what activities are safe for you.  Do not  lift anything that is heavier than 10 lb (4.5 kg), or the limit that you are told, until your health care provider says that it is safe. General instructions  Take over-the-counter and prescription medicines only as told by your health care provider.  Do not take baths, swim, or use a hot tub until your health care provider approves. Ask your health care provider if you may take showers. You may only be allowed to take sponge baths.  Do not drive for 24 hours if you were given a sedative during your procedure.  Wear a medical alert bracelet in case of an emergency. This will tell any health care providers that you have a port.  Keep all follow-up visits as told by your health care provider. This is important. Contact a health care provider if:  You cannot flush your port with saline as directed, or you cannot draw blood from the port.  You have a fever or chills.  You have redness, swelling, or pain around your port insertion site.  You have fluid or blood coming from your port insertion site.  Your port insertion site feels warm to the touch.  You have pus or a bad smell coming from the port insertion site. Get help right away if:  You have chest pain or shortness of breath.  You have bleeding from your port that you cannot control. Summary  Take care of the port as told by your   health care provider. Keep the manufacturer's information card with you at all times.  Change your dressing as told by your health care provider.  Contact a health care provider if you have a fever or chills or if you have redness, swelling, or pain around your port insertion site.  Keep all follow-up visits as told by your health care provider. This information is not intended to replace advice given to you by your health care provider. Make sure you discuss any questions you have with your health care provider. Document Revised: 06/04/2018 Document Reviewed: 06/04/2018 Elsevier Patient Education   2021 [employer] Inc.  

## 2021-01-27 NOTE — Progress Notes (Signed)
Accessed Medtronic device using sterile technique. Aspirated 3.1 ml heparin solution , then heparin 25,000 units/20 ml instilled without difficulty. Needle removed and bandaid applied. Patient tolerated procedure well. Will return on 02/24/21 for next device flush.

## 2021-02-21 ENCOUNTER — Other Ambulatory Visit: Payer: Self-pay

## 2021-02-21 ENCOUNTER — Ambulatory Visit (HOSPITAL_COMMUNITY)
Admission: RE | Admit: 2021-02-21 | Discharge: 2021-02-21 | Disposition: A | Discharge status: Home / Self Care | Payer: BC Managed Care – PPO | Source: Ambulatory Visit | Attending: Oncology | Admitting: Oncology

## 2021-02-21 ENCOUNTER — Inpatient Hospital Stay: Payer: BC Managed Care – PPO | Attending: Oncology

## 2021-02-21 ENCOUNTER — Inpatient Hospital Stay: Payer: BC Managed Care – PPO

## 2021-02-21 ENCOUNTER — Ambulatory Visit (HOSPITAL_COMMUNITY): Payer: BC Managed Care – PPO

## 2021-02-21 DIAGNOSIS — R911 Solitary pulmonary nodule: Secondary | ICD-10-CM | POA: Insufficient documentation

## 2021-02-21 DIAGNOSIS — I700 Atherosclerosis of aorta: Secondary | ICD-10-CM | POA: Diagnosis not present

## 2021-02-21 DIAGNOSIS — D6959 Other secondary thrombocytopenia: Secondary | ICD-10-CM | POA: Diagnosis not present

## 2021-02-21 DIAGNOSIS — N281 Cyst of kidney, acquired: Secondary | ICD-10-CM | POA: Insufficient documentation

## 2021-02-21 DIAGNOSIS — Z800 Family history of malignant neoplasm of digestive organs: Secondary | ICD-10-CM | POA: Diagnosis not present

## 2021-02-21 DIAGNOSIS — R229 Localized swelling, mass and lump, unspecified: Secondary | ICD-10-CM | POA: Diagnosis not present

## 2021-02-21 DIAGNOSIS — C787 Secondary malignant neoplasm of liver and intrahepatic bile duct: Secondary | ICD-10-CM | POA: Diagnosis not present

## 2021-02-21 DIAGNOSIS — C187 Malignant neoplasm of sigmoid colon: Secondary | ICD-10-CM | POA: Diagnosis present

## 2021-02-21 DIAGNOSIS — Z79899 Other long term (current) drug therapy: Secondary | ICD-10-CM | POA: Insufficient documentation

## 2021-02-21 DIAGNOSIS — D709 Neutropenia, unspecified: Secondary | ICD-10-CM | POA: Insufficient documentation

## 2021-02-21 DIAGNOSIS — Z95828 Presence of other vascular implants and grafts: Secondary | ICD-10-CM

## 2021-02-21 DIAGNOSIS — Z452 Encounter for adjustment and management of vascular access device: Secondary | ICD-10-CM | POA: Insufficient documentation

## 2021-02-21 LAB — CMP (CANCER CENTER ONLY)
ALT: 11 U/L (ref 0–44)
AST: 20 U/L (ref 15–41)
Albumin: 4.2 g/dL (ref 3.5–5.0)
Alkaline Phosphatase: 111 U/L (ref 38–126)
Anion gap: 13 (ref 5–15)
BUN: 8 mg/dL (ref 6–20)
CO2: 22 mmol/L (ref 22–32)
Calcium: 9.5 mg/dL (ref 8.9–10.3)
Chloride: 108 mmol/L (ref 98–111)
Creatinine: 0.78 mg/dL (ref 0.44–1.00)
GFR, Estimated: >60 mL/min (ref >60)
Glucose, Bld: 87 mg/dL (ref 70–99)
Potassium: 4.2 mmol/L (ref 3.5–5.1)
Sodium: 143 mmol/L (ref 135–145)
Total Bilirubin: 0.6 mg/dL (ref 0.3–1.2)
Total Protein: 7.4 g/dL (ref 6.5–8.1)

## 2021-02-21 LAB — CBC WITH DIFFERENTIAL (CANCER CENTER ONLY)
Abs Immature Granulocytes: 0.01 10*3/uL (ref 0.00–0.07)
Basophils Absolute: 0 10*3/uL (ref 0.0–0.1)
Basophils Relative: 1 %
Eosinophils Absolute: 0.1 10*3/uL (ref 0.0–0.5)
Eosinophils Relative: 1 %
HCT: 39.5 % (ref 36.0–46.0)
Hemoglobin: 13.3 g/dL (ref 12.0–15.0)
Immature Granulocytes: 0 %
Lymphocytes Relative: 24 %
Lymphs Abs: 1.1 10*3/uL (ref 0.7–4.0)
MCH: 30.7 pg (ref 26.0–34.0)
MCHC: 33.7 g/dL (ref 30.0–36.0)
MCV: 91.2 fL (ref 80.0–100.0)
Monocytes Absolute: 0.4 10*3/uL (ref 0.1–1.0)
Monocytes Relative: 8 %
Neutro Abs: 3 10*3/uL (ref 1.7–7.7)
Neutrophils Relative %: 66 %
Platelet Count: 181 10*3/uL (ref 150–400)
RBC: 4.33 MIL/uL (ref 3.87–5.11)
RDW: 12.2 % (ref 11.5–15.5)
WBC Count: 4.6 10*3/uL (ref 4.0–10.5)
nRBC: 0 % (ref 0.0–0.2)

## 2021-02-21 LAB — CEA (IN HOUSE-CHCC): CEA (CHCC-In House): 1.14 ng/mL (ref 0.00–5.00)

## 2021-02-21 MED ORDER — SODIUM CHLORIDE 0.9% FLUSH
10.0000 mL | INTRAVENOUS | Status: DC | PRN
  Administered 2021-02-21: 10 mL via INTRAVENOUS
  Filled 2021-02-21: qty 10

## 2021-02-21 MED ORDER — HEPARIN SOD (PORK) LOCK FLUSH 100 UNIT/ML IV SOLN
500.0000 [IU] | Freq: Once | INTRAVENOUS | Status: AC
  Administered 2021-02-21: 500 [IU] via INTRAVENOUS

## 2021-02-21 MED ORDER — HEPARIN SOD (PORK) LOCK FLUSH 100 UNIT/ML IV SOLN
INTRAVENOUS | Status: AC
  Filled 2021-02-21: qty 5

## 2021-02-21 MED ORDER — IOHEXOL 300 MG/ML  SOLN
100.0000 mL | Freq: Once | INTRAMUSCULAR | Status: AC | PRN
  Administered 2021-02-21: 100 mL via INTRAVENOUS

## 2021-02-21 NOTE — Patient Instructions (Signed)
Implanted Port Insertion, Care After This sheet gives you information about how to care for yourself after your procedure. Your health care provider may also give you more specific instructions. If you have problems or questions, contact your health care provider. What can I expect after the procedure? After the procedure, it is common to have:  Discomfort at the port insertion site.  Bruising on the skin over the port. This should improve over 3-4 days. Follow these instructions at home: Port care  After your port is placed, you will get a manufacturer's information card. The card has information about your port. Keep this card with you at all times.  Take care of the port as told by your health care provider. Ask your health care provider if you or a family member can get training for taking care of the port at home. A home health care nurse may also take care of the port.  Make sure to remember what type of port you have. Incision care  Follow instructions from your health care provider about how to take care of your port insertion site. Make sure you: ? Wash your hands with soap and water before and after you change your bandage (dressing). If soap and water are not available, use hand sanitizer. ? Change your dressing as told by your health care provider. ? Leave stitches (sutures), skin glue, or adhesive strips in place. These skin closures may need to stay in place for 2 weeks or longer. If adhesive strip edges start to loosen and curl up, you may trim the loose edges. Do not remove adhesive strips completely unless your health care provider tells you to do that.  Check your port insertion site every day for signs of infection. Check for: ? Redness, swelling, or pain. ? Fluid or blood. ? Warmth. ? Pus or a bad smell.      Activity  Return to your normal activities as told by your health care provider. Ask your health care provider what activities are safe for you.  Do not  lift anything that is heavier than 10 lb (4.5 kg), or the limit that you are told, until your health care provider says that it is safe. General instructions  Take over-the-counter and prescription medicines only as told by your health care provider.  Do not take baths, swim, or use a hot tub until your health care provider approves. Ask your health care provider if you may take showers. You may only be allowed to take sponge baths.  Do not drive for 24 hours if you were given a sedative during your procedure.  Wear a medical alert bracelet in case of an emergency. This will tell any health care providers that you have a port.  Keep all follow-up visits as told by your health care provider. This is important. Contact a health care provider if:  You cannot flush your port with saline as directed, or you cannot draw blood from the port.  You have a fever or chills.  You have redness, swelling, or pain around your port insertion site.  You have fluid or blood coming from your port insertion site.  Your port insertion site feels warm to the touch.  You have pus or a bad smell coming from the port insertion site. Get help right away if:  You have chest pain or shortness of breath.  You have bleeding from your port that you cannot control. Summary  Take care of the port as told by your   health care provider. Keep the manufacturer's information card with you at all times.  Change your dressing as told by your health care provider.  Contact a health care provider if you have a fever or chills or if you have redness, swelling, or pain around your port insertion site.  Keep all follow-up visits as told by your health care provider. This information is not intended to replace advice given to you by your health care provider. Make sure you discuss any questions you have with your health care provider. Document Revised: 06/04/2018 Document Reviewed: 06/04/2018 Elsevier Patient Education   2021 [employer] Inc.  

## 2021-02-23 ENCOUNTER — Other Ambulatory Visit (HOSPITAL_BASED_OUTPATIENT_CLINIC_OR_DEPARTMENT_OTHER): Payer: BC Managed Care – PPO

## 2021-02-24 ENCOUNTER — Ambulatory Visit: Payer: BC Managed Care – PPO

## 2021-02-24 ENCOUNTER — Inpatient Hospital Stay: Payer: BC Managed Care – PPO

## 2021-02-24 ENCOUNTER — Other Ambulatory Visit: Payer: BC Managed Care – PPO

## 2021-02-24 ENCOUNTER — Other Ambulatory Visit: Payer: Self-pay

## 2021-02-24 ENCOUNTER — Telehealth: Payer: Self-pay | Admitting: Oncology

## 2021-02-24 ENCOUNTER — Ambulatory Visit: Payer: BC Managed Care – PPO | Admitting: Nurse Practitioner

## 2021-02-24 ENCOUNTER — Inpatient Hospital Stay (HOSPITAL_BASED_OUTPATIENT_CLINIC_OR_DEPARTMENT_OTHER): Payer: BC Managed Care – PPO | Admitting: Oncology

## 2021-02-24 VITALS — BP 113/87 | HR 88 | Temp 98.1°F | Resp 18 | Ht 68.0 in | Wt 145.2 lb

## 2021-02-24 DIAGNOSIS — C187 Malignant neoplasm of sigmoid colon: Secondary | ICD-10-CM

## 2021-02-24 DIAGNOSIS — Z95828 Presence of other vascular implants and grafts: Secondary | ICD-10-CM

## 2021-02-24 MED ORDER — HEPARIN SODIUM (PORCINE) 5000 UNIT/ML IJ SOLN
Freq: Once | INTRAMUSCULAR | Status: AC
  Filled 2021-02-24: qty 5

## 2021-02-24 NOTE — Progress Notes (Signed)
[place] OFFICE PROGRESS NOTE   Diagnosis: Colon cancer  INTERVAL HISTORY:   [name] returns as scheduled.  She feels well.  Good appetite and energy level.  No complaint.  Objective:  Vital signs in last 24 hours:  Blood pressure 113/87, pulse 88, temperature 98.1 F (36.7 C), temperature source Tympanic, resp. rate 18, height 5' 8" (1.727 m), weight 145 lb 3.2 oz (65.9 kg), last menstrual period 04/20/2013, SpO2 97 %.    Lymphatics: No cervical, supraclavicular, axillary, or inguinal nodes Resp: Lungs clear bilaterally Cardio: Regular rate and rhythm GI: No hepatosplenomegaly, no mass, nontender, left abdomen infusion pump Vascular: No leg edema  Skin: 1 cm mobile cutaneous nodule at the right mid neck  Portacath/PICC-without erythema  Lab Results:  Lab Results  Component Value Date   WBC 4.6 02/21/2021   HGB 13.3 02/21/2021   HCT 39.5 02/21/2021   MCV 91.2 02/21/2021   PLT 181 02/21/2021   NEUTROABS 3.0 02/21/2021    CMP  Lab Results  Component Value Date   NA 143 02/21/2021   K 4.2 02/21/2021   CL 108 02/21/2021   CO2 22 02/21/2021   GLUCOSE 87 02/21/2021   BUN 8 02/21/2021   CREATININE 0.78 02/21/2021   CALCIUM 9.5 02/21/2021   PROT 7.4 02/21/2021   ALBUMIN 4.2 02/21/2021   AST 20 02/21/2021   ALT 11 02/21/2021   ALKPHOS 111 02/21/2021   BILITOT 0.6 02/21/2021   GFRNONAA >60 02/21/2021   GFRAA >60 01/27/2020    Lab Results  Component Value Date   CEA1 1.14 02/21/2021    Imaging:  CT CHEST W CONTRAST  Result Date: 02/21/2021 CLINICAL DATA:  Restaging colon cancer. EXAM: CT CHEST, ABDOMEN, AND PELVIS WITH CONTRAST TECHNIQUE: Multidetector CT imaging of the chest, abdomen and pelvis was performed following the standard protocol during bolus administration of intravenous contrast. CONTRAST:  120m OMNIPAQUE IOHEXOL 300 MG/ML  SOLN COMPARISON:  MRI abdomen January 28, 2020 and CT chest abdomen pelvis December 02, 2020 FINDINGS: CT  CHEST FINDINGS Cardiovascular: Accessed right chest Port-A-Cath with tip terminating in the right atrium. No thoracic aortic aneurysm. No central pulmonary embolus. Normal size heart. No significant pericardial effusion/thickening. Mediastinum/Nodes: No discrete thyroid nodules. No mediastinal, hilar or axillary adenopathy. Unremarkable appearance of the trachea and esophagus. Lungs/Pleura: Right apical 2 mm solid pulmonary nodule on image 16/6 is stable since at least 01/27/2019 and considered benign. No new or enlarging suspicious pulmonary nodules. No pleural effusion. No pneumothorax. Musculoskeletal: No chest wall mass or suspicious bone lesions identified. CT ABDOMEN PELVIS FINDINGS Hepatobiliary: Stable postsurgical changes segment III hepatic wedge resection without evidence local recurrence. Stable appearance of the previous partial posteroinferior right hepatectomy without evidence of local recurrence. Similar appearance of the 4 scattered hypodense hepatic lesions the largest of which measures 1.7 cm in segment VII, which were previously characterized as benign hepatic cysts on MRI. No new suspicious hepatic lesions. Gallbladder is surgically absent.  No biliary ductal dilatation. Pancreas: Unremarkable. No pancreatic ductal dilatation or surrounding inflammatory changes. Spleen: Normal in size without focal abnormality. Adrenals/Urinary Tract: Adrenal glands are unremarkable. Kidneys are without renal calculi, enhancing solid lesion, or hydronephrosis. 8 mm left interpolar renal cyst is stable in size. Bladder is unremarkable for degree of distension. Stomach/Bowel: Stomach is grossly unremarkable for degree of distension. No suspicious small bowel wall thickening or dilation. Oral contrast transits the sigmoid colon. Stable postsurgical changes of partial distal colectomy without large bowel wall thickening or acute  pericolonic fat stranding. Vascular/Lymphatic: Aortic atherosclerosis. No pathologically  enlarged abdominal or pelvic lymph nodes. Reproductive: Uterus and bilateral adnexa are unremarkable. Other: No abdominopelvic ascites. Stimulator device in the subcutaneous ventral left abdominal wall with lead terminating in the region of the porta hepatis. Musculoskeletal: No aggressive lytic or blastic lesion of bone. No acute osseous abnormality. IMPRESSION: 1. No evidence of new metastatic disease in the chest abdomen or pelvis. 2. Stable postsurgical changes from segment III hepatic wedge resection and prior partial hepatectomy in the posteroinferior right liver, without evidence of local recurrence. No new suspicious hepatic lesions. 3. Aortic atherosclerosis. Aortic Atherosclerosis (ICD10-I70.0). Electronically Signed   By: Dahlia Bailiff MD   On: 02/21/2021 16:51   CT ABDOMEN PELVIS W CONTRAST  Result Date: 02/21/2021 CLINICAL DATA:  Restaging colon cancer. EXAM: CT CHEST, ABDOMEN, AND PELVIS WITH CONTRAST TECHNIQUE: Multidetector CT imaging of the chest, abdomen and pelvis was performed following the standard protocol during bolus administration of intravenous contrast. CONTRAST:  134m OMNIPAQUE IOHEXOL 300 MG/ML  SOLN COMPARISON:  MRI abdomen January 28, 2020 and CT chest abdomen pelvis December 02, 2020 FINDINGS: CT CHEST FINDINGS Cardiovascular: Accessed right chest Port-A-Cath with tip terminating in the right atrium. No thoracic aortic aneurysm. No central pulmonary embolus. Normal size heart. No significant pericardial effusion/thickening. Mediastinum/Nodes: No discrete thyroid nodules. No mediastinal, hilar or axillary adenopathy. Unremarkable appearance of the trachea and esophagus. Lungs/Pleura: Right apical 2 mm solid pulmonary nodule on image 16/6 is stable since at least 01/27/2019 and considered benign. No new or enlarging suspicious pulmonary nodules. No pleural effusion. No pneumothorax. Musculoskeletal: No chest wall mass or suspicious bone lesions identified. CT ABDOMEN PELVIS FINDINGS  Hepatobiliary: Stable postsurgical changes segment III hepatic wedge resection without evidence local recurrence. Stable appearance of the previous partial posteroinferior right hepatectomy without evidence of local recurrence. Similar appearance of the 4 scattered hypodense hepatic lesions the largest of which measures 1.7 cm in segment VII, which were previously characterized as benign hepatic cysts on MRI. No new suspicious hepatic lesions. Gallbladder is surgically absent.  No biliary ductal dilatation. Pancreas: Unremarkable. No pancreatic ductal dilatation or surrounding inflammatory changes. Spleen: Normal in size without focal abnormality. Adrenals/Urinary Tract: Adrenal glands are unremarkable. Kidneys are without renal calculi, enhancing solid lesion, or hydronephrosis. 8 mm left interpolar renal cyst is stable in size. Bladder is unremarkable for degree of distension. Stomach/Bowel: Stomach is grossly unremarkable for degree of distension. No suspicious small bowel wall thickening or dilation. Oral contrast transits the sigmoid colon. Stable postsurgical changes of partial distal colectomy without large bowel wall thickening or acute pericolonic fat stranding. Vascular/Lymphatic: Aortic atherosclerosis. No pathologically enlarged abdominal or pelvic lymph nodes. Reproductive: Uterus and bilateral adnexa are unremarkable. Other: No abdominopelvic ascites. Stimulator device in the subcutaneous ventral left abdominal wall with lead terminating in the region of the porta hepatis. Musculoskeletal: No aggressive lytic or blastic lesion of bone. No acute osseous abnormality. IMPRESSION: 1. No evidence of new metastatic disease in the chest abdomen or pelvis. 2. Stable postsurgical changes from segment III hepatic wedge resection and prior partial hepatectomy in the posteroinferior right liver, without evidence of local recurrence. No new suspicious hepatic lesions. 3. Aortic atherosclerosis. Aortic  Atherosclerosis (ICD10-I70.0). Electronically Signed   By: JDahlia BailiffMD   On: 02/21/2021 16:51    Medications: I have reviewed the patient's current medications.   Assessment/Plan: 1.Adenocarcinoma of the sigmoid colon, status post a colonoscopic biopsy 07/07/2016  Mismatch repair protein expression-normal  Sigmoid mass noted  at 20 cm from the anal verge  Staging CTs of the chest, abdomen, and pelvis negative for evidence of metastatic disease other than an indeterminate 10 mm right liver lesion  MRI of the abdomen 07/07/2016 revealed multiple bilateral hepatic cyst. 7 mm inferior subcapsular right liver lesion suspicious for a metastasis  PET scan 07/21/2016-hypermetabolic sigmoid colon mass, mildly hypermetabolic segment 6 liver lesion  Laparoscopic assisted rectosigmoid colectomy and segment 8 liver resection 07/27/2016, pathology confirmed a T3, N2b, M1tumor, 11/16 lymph nodes positive for metastatic carcinoma  Foundation 1 testing found the tumor to be microsatellite stable, tumor mutation burden low, no BRAFor RAS mutation  No loss of mismatch repair protein expression, MSI-stable  Cycle 1 CAPOX 08/22/2016  Cycle 2 CAPOX 09/12/2016  Cycle 3 CAPOX 10/03/2016 (Xeloda dose reduced to 3 tablets every morning and 2 tablets every evening beginning with cycle 3)  Cycle 4 CAPOX 10/31/2016 (Xeloda re-escalated to original dose)  Cycle 5 CAPOX 11/21/2016  Cycle 6 CAPOX 12/12/2016  Cycle 7 CAPOX 01/09/2017 (oxaliplatin held)  Cycle 8 CAPOX 01/30/2017 (oxaliplatin dose reduced)  CT scans 02/27/2017-negative for recurrent colon cancer  CTs 08/14/2017-negative for recurrent cancer  CTs 02/12/2018-negative for recurrent cancer  CTs 07/29/2018-negative for recurrent cancer   CTs 01/27/2019-negative for recurrent cancer, new 8 mm subpleural left lower lobe nodule  CT chest 04/29/2019- resolution of left lower lobe nodule  CT 07/29/2019- negative for recurrent cancer  CTs  01/27/2020-stable 4 mm right apical lung nodule.  New 1.6 x 1.6 cm lesion identified in the lateral segment left liver.  Stable 9 mm subcapsular lesion interpolar left kidney.  MRI liver 3/10/ 2021-15 mm enhancing lesion in segment 3, new from September 2020, subcentimeter hemorrhagic cyst in the left kidney  Robotic assisted partial hepatectomy (segment 3), cholecystectomy, portal lymphadenectomy, and insertion of hepatic infusion pump 02/12/2020, 0/3 nodes, 1.9 cm adenocarcinoma in segment 3, negative margins  03/11/2020-FUDR and infusional 5-FU #1  04/08/2020-FUDR #2  05/06/2020-FUDR#3  06/03/2020-FUDR#4 canceled secondary to elevation of the alkaline phosphatase  07/15/2020-FUDR#4, 50% dose reduction  09/09/2000-CTs chest/abdomen/pelvis-negative  12/02/2020- CTs negative for recurrent disease  02/21/2021-CTs negative for recurrent disease, stable surgical changes in the liver  2. History of rectal bleeding secondary to #1  3. Multiple polyps on the colonoscopy 07/07/2016  Admission 07/13/2016 with acute GI bleeding secondary to an ulcer at the cecum  Colonoscopy 10/08/2017-small polyps removed from the cecum-colonic mucosa with lymphoid aggregates-no dysplasia or malignancy  4. Family history of breast and pancreas cancer  5. Delayed nausea following cycle 1 CAPOX-emend added with cycle 2  6. Diarrhea following cycle 2 and cycle 6 CAPOX  7. Neutropenia/thrombocytopenia secondary to chemotherapy  8. History of mild oxaliplatin neuropathy- resolved      Disposition: [name] is in clinical remission from colon cancer.  The CEA is normal and the restaging CTs revealed no evidence of recurrent disease.  The plan is to continue observation.  She will return for a monthly flush of the abdominal infusion pump.  The Port-A-Cath will be flushed every 8 weeks.  She will be scheduled for an office visit and restaging CTs in 6 months.  Betsy Coder, MD  02/24/2021   11:59 AM

## 2021-02-24 NOTE — Telephone Encounter (Signed)
Appointments scheduled per 4/7 los & contrast was provided as well as phone number for Central Scheduling for CT's to be scheduled w/ return visit

## 2021-03-01 NOTE — Progress Notes (Signed)
Accessed Medtronic pump and aspirated residual heparin and instilled 20 ml volume of heparin. Patient tolerated without difficulty.

## 2021-03-24 ENCOUNTER — Other Ambulatory Visit: Payer: Self-pay

## 2021-03-24 ENCOUNTER — Inpatient Hospital Stay: Payer: BC Managed Care – PPO | Attending: Oncology

## 2021-03-24 VITALS — BP 117/85 | HR 87 | Temp 98.2°F | Resp 20

## 2021-03-24 DIAGNOSIS — Z95828 Presence of other vascular implants and grafts: Secondary | ICD-10-CM

## 2021-03-24 DIAGNOSIS — Z452 Encounter for adjustment and management of vascular access device: Secondary | ICD-10-CM | POA: Diagnosis present

## 2021-03-24 DIAGNOSIS — C187 Malignant neoplasm of sigmoid colon: Secondary | ICD-10-CM | POA: Insufficient documentation

## 2021-03-24 MED ORDER — SODIUM CHLORIDE (PF) 0.9 % IJ SOLN
Freq: Once | INTRAMUSCULAR | Status: AC
  Filled 2021-03-24: qty 5

## 2021-03-24 MED ORDER — SODIUM CHLORIDE 0.9% FLUSH
10.0000 mL | INTRAVENOUS | Status: DC | PRN
  Administered 2021-03-24: 10 mL via INTRAVENOUS
  Filled 2021-03-24: qty 10

## 2021-03-24 MED ORDER — HEPARIN SOD (PORK) LOCK FLUSH 100 UNIT/ML IV SOLN
500.0000 [IU] | Freq: Once | INTRAVENOUS | Status: AC | PRN
  Administered 2021-03-24: 500 [IU] via INTRAVENOUS
  Filled 2021-03-24: qty 5

## 2021-03-24 NOTE — Progress Notes (Signed)
Accessed Medtronic pump left abdomen with kit needle (X2 attempts) and aspirated 3.0 ml heparinized saline. Refilled pump w/Heparin 25,000 unit in 66m NS.Patient tolerated procedure well. Medtronic I-pad updated. Will need next fill by 04/21/21

## 2021-04-21 ENCOUNTER — Other Ambulatory Visit: Payer: Self-pay

## 2021-04-21 ENCOUNTER — Inpatient Hospital Stay: Payer: BC Managed Care – PPO | Attending: Oncology

## 2021-04-21 VITALS — BP 121/77 | HR 89 | Temp 99.2°F | Resp 20

## 2021-04-21 DIAGNOSIS — C187 Malignant neoplasm of sigmoid colon: Secondary | ICD-10-CM

## 2021-04-21 DIAGNOSIS — Z452 Encounter for adjustment and management of vascular access device: Secondary | ICD-10-CM | POA: Insufficient documentation

## 2021-04-21 DIAGNOSIS — Z95828 Presence of other vascular implants and grafts: Secondary | ICD-10-CM

## 2021-04-21 MED ORDER — SODIUM CHLORIDE (PF) 0.9 % IJ SOLN
Freq: Once | INTRAMUSCULAR | Status: AC
  Filled 2021-04-21: qty 5

## 2021-04-21 NOTE — Progress Notes (Signed)
Reservoir volume measured 3.5 ml via Ipad. Accessed Medtronic device using sterile technique and aspirated 5 ml heparin solution from reservoir. Instilled 20 ml volume of NS with 25,000 units heparin checking for return every 5 ml. Needle was then discontinued. Patient tolerated procedure will.  Will return on 6/30 for device flush and port flush.

## 2021-05-19 ENCOUNTER — Other Ambulatory Visit: Payer: Self-pay

## 2021-05-19 ENCOUNTER — Inpatient Hospital Stay: Payer: BC Managed Care – PPO

## 2021-05-19 VITALS — BP 104/76 | HR 87 | Temp 98.6°F | Resp 18

## 2021-05-19 DIAGNOSIS — Z95828 Presence of other vascular implants and grafts: Secondary | ICD-10-CM

## 2021-05-19 DIAGNOSIS — C187 Malignant neoplasm of sigmoid colon: Secondary | ICD-10-CM | POA: Diagnosis not present

## 2021-05-19 MED ORDER — SODIUM CHLORIDE (PF) 0.9 % IJ SOLN
Freq: Once | INTRAMUSCULAR | Status: AC
  Filled 2021-05-19: qty 5

## 2021-05-19 MED ORDER — SODIUM CHLORIDE 0.9% FLUSH
10.0000 mL | INTRAVENOUS | Status: DC | PRN
  Administered 2021-05-19: 10 mL via INTRAVENOUS
  Filled 2021-05-19: qty 10

## 2021-05-19 MED ORDER — HEPARIN SOD (PORK) LOCK FLUSH 100 UNIT/ML IV SOLN
500.0000 [IU] | Freq: Once | INTRAVENOUS | Status: AC | PRN
  Administered 2021-05-19: 500 [IU] via INTRAVENOUS
  Filled 2021-05-19: qty 5

## 2021-05-19 NOTE — Progress Notes (Signed)
Accessed Medtronic pump left abdomen using sterile technique and measured 3.2 ml residual via ipad. Was able to aspirate ~ 3.5 ml from pump. Instilled 25,000 units heparin in 20 ml NS (2nd RN to verify) without difficulty aspirating every 3 ml. Needle removed. Updated volume via Ipad at 20 ml. Tolerated procedure well.

## 2021-06-16 ENCOUNTER — Other Ambulatory Visit: Payer: Self-pay

## 2021-06-16 ENCOUNTER — Inpatient Hospital Stay: Payer: BC Managed Care – PPO

## 2021-06-16 ENCOUNTER — Inpatient Hospital Stay: Payer: BC Managed Care – PPO | Attending: Oncology

## 2021-06-16 DIAGNOSIS — C187 Malignant neoplasm of sigmoid colon: Secondary | ICD-10-CM | POA: Insufficient documentation

## 2021-06-16 DIAGNOSIS — Z95828 Presence of other vascular implants and grafts: Secondary | ICD-10-CM

## 2021-06-16 LAB — CEA (ACCESS): CEA (CHCC): <1 ng/mL (ref 0.00–5.00)

## 2021-06-16 MED ORDER — SODIUM CHLORIDE (PF) 0.9 % IJ SOLN
Freq: Once | INTRAMUSCULAR | Status: AC
  Filled 2021-06-16: qty 5

## 2021-06-16 MED ORDER — SODIUM CHLORIDE 0.9% FLUSH
10.0000 mL | INTRAVENOUS | Status: DC | PRN
  Filled 2021-06-16: qty 10

## 2021-06-16 MED ORDER — HEPARIN SOD (PORK) LOCK FLUSH 100 UNIT/ML IV SOLN
500.0000 [IU] | Freq: Once | INTRAVENOUS | Status: DC | PRN
  Filled 2021-06-16: qty 5

## 2021-06-16 NOTE — Progress Notes (Signed)
Accessed Medtonic pump left abdomen using sterile technique without difficulty. Aspirated 3.2 ml and confirmed w/Ipad. Instilled 25,000 units heparin in 20 ml NS w/placement confirmation evert 3 ml. Needle removed with no bleeding. Site is unremarkable pre/post procedure. Tolerated well.

## 2021-06-17 LAB — CEA (IN HOUSE-CHCC): CEA (CHCC-In House): 1.05 ng/mL (ref 0.00–5.00)

## 2021-07-14 ENCOUNTER — Other Ambulatory Visit: Payer: Self-pay

## 2021-07-14 ENCOUNTER — Inpatient Hospital Stay: Payer: BC Managed Care – PPO | Attending: Oncology

## 2021-07-14 VITALS — BP 132/72 | HR 84 | Temp 98.5°F | Resp 16

## 2021-07-14 DIAGNOSIS — Z452 Encounter for adjustment and management of vascular access device: Secondary | ICD-10-CM | POA: Diagnosis not present

## 2021-07-14 DIAGNOSIS — C187 Malignant neoplasm of sigmoid colon: Secondary | ICD-10-CM | POA: Insufficient documentation

## 2021-07-14 DIAGNOSIS — Z95828 Presence of other vascular implants and grafts: Secondary | ICD-10-CM

## 2021-07-14 MED ORDER — SODIUM CHLORIDE (PF) 0.9 % IJ SOLN
Freq: Once | INTRAMUSCULAR | Status: AC
  Filled 2021-07-14: qty 5

## 2021-07-14 NOTE — Progress Notes (Signed)
Accessed Medtronic pump LUQ abdomen using sterile technique without difficulty. Aspirated 16m (Ipad noted 3.117m. Flushed with 20 ml NS with 25,000 units heparin aspirating every 3 ml. Needle removed and bandaid applied. Will return 08/11/21 for next refill. Tolerated procedure well.

## 2021-07-14 NOTE — Patient Instructions (Signed)
Return on 08/11/21 at 2 pm for port flush and lab and hepatic pump flush.

## 2021-08-02 ENCOUNTER — Other Ambulatory Visit: Payer: Self-pay

## 2021-08-02 DIAGNOSIS — Z95828 Presence of other vascular implants and grafts: Secondary | ICD-10-CM

## 2021-08-11 ENCOUNTER — Inpatient Hospital Stay: Payer: BC Managed Care – PPO | Attending: Oncology

## 2021-08-11 ENCOUNTER — Other Ambulatory Visit: Payer: Self-pay

## 2021-08-11 ENCOUNTER — Inpatient Hospital Stay: Payer: BC Managed Care – PPO

## 2021-08-11 VITALS — BP 100/79 | HR 88 | Temp 98.2°F | Resp 18

## 2021-08-11 DIAGNOSIS — Z803 Family history of malignant neoplasm of breast: Secondary | ICD-10-CM | POA: Diagnosis not present

## 2021-08-11 DIAGNOSIS — Z452 Encounter for adjustment and management of vascular access device: Secondary | ICD-10-CM | POA: Diagnosis not present

## 2021-08-11 DIAGNOSIS — C187 Malignant neoplasm of sigmoid colon: Secondary | ICD-10-CM

## 2021-08-11 DIAGNOSIS — Z95828 Presence of other vascular implants and grafts: Secondary | ICD-10-CM

## 2021-08-11 DIAGNOSIS — Z800 Family history of malignant neoplasm of digestive organs: Secondary | ICD-10-CM | POA: Diagnosis not present

## 2021-08-11 DIAGNOSIS — Z9221 Personal history of antineoplastic chemotherapy: Secondary | ICD-10-CM | POA: Diagnosis not present

## 2021-08-11 DIAGNOSIS — Z85038 Personal history of other malignant neoplasm of large intestine: Secondary | ICD-10-CM | POA: Diagnosis not present

## 2021-08-11 MED ORDER — HEPARIN SODIUM (PORCINE) 5000 UNIT/ML IJ SOLN
Freq: Once | INTRAMUSCULAR | Status: AC
  Filled 2021-08-11: qty 5

## 2021-08-11 NOTE — Progress Notes (Signed)
Accessed Medtronic pump LUQ abdomen using sterile technique without difficulty. Aspirated 3.2 ml (Ipad noted 3.2 ml). Flushed with 20 ml NS with 25,000 units heparin aspirating every 3 ml. Needle removed and bandaid applied. Will return 09/08/21 for next refill. Tolerated procedure well.

## 2021-08-18 ENCOUNTER — Ambulatory Visit
Admission: RE | Admit: 2021-08-18 | Discharge: 2021-08-18 | Disposition: A | Discharge status: Home / Self Care | Payer: BC Managed Care – PPO | Source: Ambulatory Visit | Attending: Oncology | Admitting: Oncology

## 2021-08-18 ENCOUNTER — Inpatient Hospital Stay: Payer: BC Managed Care – PPO

## 2021-08-18 ENCOUNTER — Other Ambulatory Visit: Payer: BC Managed Care – PPO

## 2021-08-18 ENCOUNTER — Inpatient Hospital Stay
Admission: RE | Admit: 2021-08-18 | Discharge: 2021-08-18 | Disposition: A | Discharge status: Home / Self Care | Payer: BC Managed Care – PPO | Source: Ambulatory Visit | Attending: Oncology | Admitting: Oncology

## 2021-08-18 ENCOUNTER — Inpatient Hospital Stay (HOSPITAL_BASED_OUTPATIENT_CLINIC_OR_DEPARTMENT_OTHER): Payer: BC Managed Care – PPO | Admitting: Oncology

## 2021-08-18 ENCOUNTER — Other Ambulatory Visit: Payer: Self-pay

## 2021-08-18 ENCOUNTER — Inpatient Hospital Stay: Admission: RE | Admit: 2021-08-18 | Payer: BC Managed Care – PPO | Source: Ambulatory Visit

## 2021-08-18 VITALS — BP 121/80 | HR 91 | Temp 98.1°F | Resp 16

## 2021-08-18 VITALS — BP 145/90 | HR 97 | Temp 98.2°F | Resp 18 | Ht 68.0 in | Wt 141.6 lb

## 2021-08-18 DIAGNOSIS — C187 Malignant neoplasm of sigmoid colon: Secondary | ICD-10-CM

## 2021-08-18 DIAGNOSIS — Z95828 Presence of other vascular implants and grafts: Secondary | ICD-10-CM

## 2021-08-18 DIAGNOSIS — Z85038 Personal history of other malignant neoplasm of large intestine: Secondary | ICD-10-CM | POA: Diagnosis not present

## 2021-08-18 LAB — CEA (ACCESS): CEA (CHCC): <1 ng/mL (ref 0.00–5.00)

## 2021-08-18 LAB — CMP (CANCER CENTER ONLY)
ALT: 12 U/L (ref 0–44)
AST: 19 U/L (ref 15–41)
Albumin: 4.7 g/dL (ref 3.5–5.0)
Alkaline Phosphatase: 99 U/L (ref 38–126)
Anion gap: 8 (ref 5–15)
BUN: 11 mg/dL (ref 6–20)
CO2: 26 mmol/L (ref 22–32)
Calcium: 9.9 mg/dL (ref 8.9–10.3)
Chloride: 104 mmol/L (ref 98–111)
Creatinine: 0.79 mg/dL (ref 0.44–1.00)
GFR, Estimated: >60 mL/min (ref >60)
Glucose, Bld: 90 mg/dL (ref 70–99)
Potassium: 4.1 mmol/L (ref 3.5–5.1)
Sodium: 138 mmol/L (ref 135–145)
Total Bilirubin: 0.5 mg/dL (ref 0.3–1.2)
Total Protein: 7.2 g/dL (ref 6.5–8.1)

## 2021-08-18 MED ORDER — SODIUM CHLORIDE 0.9% FLUSH: 10.0000 mL | INTRAVENOUS | Status: DC | PRN

## 2021-08-18 MED ORDER — SODIUM CHLORIDE 0.9% FLUSH
10.0000 mL | INTRAVENOUS | Status: DC | PRN
  Administered 2021-08-18: 10 mL via INTRAVENOUS

## 2021-08-18 MED ORDER — IOPAMIDOL (ISOVUE-300) INJECTION 61%
100.0000 mL | Freq: Once | INTRAVENOUS | Status: AC | PRN
  Administered 2021-08-18: 100 mL via INTRAVENOUS

## 2021-08-18 MED ORDER — HEPARIN SOD (PORK) LOCK FLUSH 100 UNIT/ML IV SOLN
500.0000 [IU] | Freq: Once | INTRAVENOUS | Status: AC
  Administered 2021-08-18: 500 [IU] via INTRAVENOUS

## 2021-08-18 NOTE — Progress Notes (Signed)
[place] OFFICE PROGRESS NOTE   Diagnosis: Colon cancer  INTERVAL HISTORY:   [name] returns as scheduled.  She feels well.  No complaint.  The "cyst "of the right neck is unchanged.  She continues a monthly flush of the hepatic infusion pump and every 8-week Port-A-Cath flush.  Objective:  Vital signs in last 24 hours:  Blood pressure (!) 145/90, pulse 97, temperature 98.2 F (36.8 C), temperature source Oral, resp. rate 18, height _0  (1.727 m), weight 141 lb 9.6 oz (64.2 kg), last menstrual period 04/20/2013, SpO2 99 %.     Lymphatics: No cervical, supraclavicular, axillary, or inguinal nodes Resp: Lungs clear bilaterally Cardio: Regular rate and rhythm GI: No hepatosplenomegaly, left abdomen infusion pump, nontender Vascular: No leg edema  Skin: 1-1.5 cm round mobile cystic lesion at the right anterior neck, central head?  Portacath/PICC-without erythema  Lab Results:  Lab Results  Component Value Date   WBC 4.6 02/21/2021   HGB 13.3 02/21/2021   HCT 39.5 02/21/2021   MCV 91.2 02/21/2021   PLT 181 02/21/2021   NEUTROABS 3.0 02/21/2021    CMP  Lab Results  Component Value Date   NA 138 08/18/2021   K 4.1 08/18/2021   CL 104 08/18/2021   CO2 26 08/18/2021   GLUCOSE 90 08/18/2021   BUN 11 08/18/2021   CREATININE 0.79 08/18/2021   CALCIUM 9.9 08/18/2021   PROT 7.2 08/18/2021   ALBUMIN 4.7 08/18/2021   AST 19 08/18/2021   ALT 12 08/18/2021   ALKPHOS 99 08/18/2021   BILITOT 0.5 08/18/2021   GFRNONAA >60 08/18/2021   GFRAA >60 01/27/2020    Lab Results  Component Value Date   CEA1 1.05 06/16/2021   CEA <1.00 08/18/2021    Lab Results  Component Value Date   INR 1.27 07/13/2016   LABPROT 16.0 (H) 07/13/2016    Imaging:  CT CHEST W CONTRAST  Result Date: 08/18/2021 CLINICAL DATA:  Colon cancer restaging EXAM: CT CHEST, ABDOMEN, AND PELVIS WITH CONTRAST TECHNIQUE: Multidetector CT imaging of the chest, abdomen and pelvis  was performed following the standard protocol during bolus administration of intravenous contrast. CONTRAST:  146m ISOVUE-300 IOPAMIDOL (ISOVUE-300) INJECTION 61%, additional oral enteric contrast COMPARISON:  02/21/2021 FINDINGS: CT CHEST FINDINGS Cardiovascular: Right chest port catheter. Normal heart size. No pericardial effusion. Mediastinum/Nodes: No enlarged mediastinal, hilar, or axillary lymph nodes. Thyroid gland, trachea, and esophagus demonstrate no significant findings. Lungs/Pleura: Lungs are clear. No pleural effusion or pneumothorax. Musculoskeletal: No chest wall mass or suspicious bone lesions identified. CT ABDOMEN PELVIS FINDINGS Hepatobiliary: Unchanged postoperative findings of anterior left lobe and posterior right lobe partial hepatectomy (series 2, image 70, 77). Unchanged benign cysts in the left and right lobe of the liver. Status post cholecystectomy. No biliary ductal dilatation. Pancreas: Unremarkable. No pancreatic ductal dilatation or surrounding inflammatory changes. Spleen: Normal in size without significant abnormality. Adrenals/Urinary Tract: Adrenal glands are unremarkable. Kidneys are normal, without renal calculi, solid lesion, or hydronephrosis. Bladder is unremarkable. Stomach/Bowel: Stomach is within normal limits. Appendix appears normal. No evidence of bowel wall thickening, distention, or inflammatory changes. Redemonstrated postoperative findings of rectosigmoid resection and reanastomosis. Vascular/Lymphatic: Aortic atherosclerosis. No enlarged abdominal or pelvic lymph nodes. Reproductive: No mass or other abnormality. Other: No abdominal wall hernia or abnormality. Stimulator device positioned in the left lower quadrant superficial soft tissues, lead tip again positioned in the vicinity of the porta hepatis and pancreatic head. No abdominopelvic ascites. Musculoskeletal: No acute or significant osseous  findings. IMPRESSION: 1. Status post rectosigmoid colon resection  and reanastomosis. 2. Unchanged postoperative findings of anterior left lobe and posterior right lobe partial hepatectomy. 3. No evidence of recurrent or new metastatic disease in the chest, abdomen, or pelvis. Aortic Atherosclerosis (ICD10-I70.0). Electronically Signed   By: Delanna Ahmadi M.D.   On: 08/18/2021 09:30   CT ABDOMEN PELVIS W CONTRAST  Result Date: 08/18/2021 CLINICAL DATA:  Colon cancer restaging EXAM: CT CHEST, ABDOMEN, AND PELVIS WITH CONTRAST TECHNIQUE: Multidetector CT imaging of the chest, abdomen and pelvis was performed following the standard protocol during bolus administration of intravenous contrast. CONTRAST:  138m ISOVUE-300 IOPAMIDOL (ISOVUE-300) INJECTION 61%, additional oral enteric contrast COMPARISON:  02/21/2021 FINDINGS: CT CHEST FINDINGS Cardiovascular: Right chest port catheter. Normal heart size. No pericardial effusion. Mediastinum/Nodes: No enlarged mediastinal, hilar, or axillary lymph nodes. Thyroid gland, trachea, and esophagus demonstrate no significant findings. Lungs/Pleura: Lungs are clear. No pleural effusion or pneumothorax. Musculoskeletal: No chest wall mass or suspicious bone lesions identified. CT ABDOMEN PELVIS FINDINGS Hepatobiliary: Unchanged postoperative findings of anterior left lobe and posterior right lobe partial hepatectomy (series 2, image 70, 77). Unchanged benign cysts in the left and right lobe of the liver. Status post cholecystectomy. No biliary ductal dilatation. Pancreas: Unremarkable. No pancreatic ductal dilatation or surrounding inflammatory changes. Spleen: Normal in size without significant abnormality. Adrenals/Urinary Tract: Adrenal glands are unremarkable. Kidneys are normal, without renal calculi, solid lesion, or hydronephrosis. Bladder is unremarkable. Stomach/Bowel: Stomach is within normal limits. Appendix appears normal. No evidence of bowel wall thickening, distention, or inflammatory changes. Redemonstrated postoperative  findings of rectosigmoid resection and reanastomosis. Vascular/Lymphatic: Aortic atherosclerosis. No enlarged abdominal or pelvic lymph nodes. Reproductive: No mass or other abnormality. Other: No abdominal wall hernia or abnormality. Stimulator device positioned in the left lower quadrant superficial soft tissues, lead tip again positioned in the vicinity of the porta hepatis and pancreatic head. No abdominopelvic ascites. Musculoskeletal: No acute or significant osseous findings. IMPRESSION: 1. Status post rectosigmoid colon resection and reanastomosis. 2. Unchanged postoperative findings of anterior left lobe and posterior right lobe partial hepatectomy. 3. No evidence of recurrent or new metastatic disease in the chest, abdomen, or pelvis. Aortic Atherosclerosis (ICD10-I70.0). Electronically Signed   By: ADelanna AhmadiM.D.   On: 08/18/2021 09:30    Medications: I have reviewed the patient's current medications.   Assessment/Plan: Adenocarcinoma of the sigmoid colon, status post a colonoscopic biopsy 07/07/2016 Mismatch repair protein expression-normal Sigmoid mass noted at 20 cm from the anal verge Staging CTs of the chest, abdomen, and pelvis negative for evidence of metastatic disease other than an indeterminate 10 mm right liver lesion MRI of the abdomen 07/07/2016 revealed multiple bilateral hepatic cyst. 7 mm inferior subcapsular right liver lesion suspicious for a metastasis PET scan 07/21/2016-hypermetabolic sigmoid colon mass, mildly hypermetabolic segment 6 liver lesion Laparoscopic assisted rectosigmoid colectomy and segment 8 liver resection 07/27/2016, pathology confirmed a T3, N2b, M1 tumor, 11/16 lymph nodes positive for metastatic carcinoma Foundation 1 testing found the tumor to be microsatellite stable, tumor mutation burden low, no BRAF or RAS mutation No loss of mismatch repair protein expression, MSI-stable Cycle 1 CAPOX 08/22/2016 Cycle 2 CAPOX 09/12/2016 Cycle 3 CAPOX  10/03/2016 (Xeloda dose reduced to 3 tablets every morning and 2 tablets every evening beginning with cycle 3) Cycle 4 CAPOX 10/31/2016 (Xeloda re-escalated to original dose) Cycle 5 CAPOX 11/21/2016 Cycle 6 CAPOX 12/12/2016 Cycle 7 CAPOX 01/09/2017 (oxaliplatin held) Cycle 8 CAPOX 01/30/2017 (oxaliplatin dose reduced) CT scans 02/27/2017-negative for  recurrent colon cancer CTs 08/14/2017-negative for recurrent cancer CTs 02/12/2018-negative for recurrent cancer CTs 07/29/2018-negative for recurrent cancer  CTs 01/27/2019-negative for recurrent cancer, new 8 mm subpleural left lower lobe nodule CT chest 04/29/2019- resolution of left lower lobe nodule CT 07/29/2019- negative for recurrent cancer CTs 01/27/2020-stable 4 mm right apical lung nodule.  New 1.6 x 1.6 cm lesion identified in the lateral segment left liver.  Stable 9 mm subcapsular lesion interpolar left kidney. MRI liver 3/10/ 2021-15 mm enhancing lesion in segment 3, new from September 2020, subcentimeter hemorrhagic cyst in the left kidney Robotic assisted partial hepatectomy (segment 3), cholecystectomy, portal lymphadenectomy, and insertion of hepatic infusion pump 02/12/2020, 0/3 nodes, 1.9 cm adenocarcinoma in segment 3, negative margins 03/11/2020-FUDR and infusional 5-FU #1 04/08/2020-FUDR #2 05/06/2020-FUDR#3 06/03/2020-FUDR#4 canceled secondary to elevation of the alkaline phosphatase 07/15/2020-FUDR#4, 50% dose reduction 09/09/2000-CTs chest/abdomen/pelvis-negative 12/02/2020- CTs negative for recurrent disease 02/21/2021-CTs negative for recurrent disease, stable surgical changes in the liver CTs 08/18/2021-no evidence of recurrent disease, stable postoperative findings of partial hepatectomy   2.   History of rectal bleeding secondary to #1   3.   Multiple polyps on the colonoscopy 07/07/2016 Admission 07/13/2016 with  acute GI bleeding secondary to an ulcer at the cecum Colonoscopy 10/08/2017-small polyps removed from the  cecum-colonic mucosa with lymphoid aggregates-no dysplasia or malignancy   4.   Family history of breast and pancreas cancer   5.  Delayed nausea following cycle 1 CAPOX-emend added with cycle 2   6. Diarrhea following cycle 2 and cycle 6 CAPOX   7.  Neutropenia/thrombocytopenia secondary to chemotherapy   8.  History of mild oxaliplatin neuropathy- resolved     Disposition: [name] is in clinical remission from colon cancer.  She will return for an office visit and restaging CTs in 6 months.  She will continue a monthly flush of the hepatic infusion pump and every 8-week Port-A-Cath flush.  Betsy Coder, MD  08/18/2021  10:34 AM

## 2021-08-18 NOTE — Progress Notes (Signed)
Accessed port today for CT scan w/labs drawn

## 2021-08-19 LAB — CEA (IN HOUSE-CHCC): CEA (CHCC-In House): <1 ng/mL (ref 0.00–5.00)

## 2021-09-06 ENCOUNTER — Inpatient Hospital Stay: Payer: BC Managed Care – PPO | Attending: Oncology

## 2021-09-06 ENCOUNTER — Other Ambulatory Visit: Payer: Self-pay

## 2021-09-06 VITALS — BP 126/90 | HR 85 | Temp 98.1°F | Resp 20

## 2021-09-06 DIAGNOSIS — Z85038 Personal history of other malignant neoplasm of large intestine: Secondary | ICD-10-CM | POA: Insufficient documentation

## 2021-09-06 DIAGNOSIS — Z452 Encounter for adjustment and management of vascular access device: Secondary | ICD-10-CM | POA: Diagnosis not present

## 2021-09-06 DIAGNOSIS — Z95828 Presence of other vascular implants and grafts: Secondary | ICD-10-CM

## 2021-09-06 DIAGNOSIS — C187 Malignant neoplasm of sigmoid colon: Secondary | ICD-10-CM

## 2021-09-06 MED ORDER — HEPARIN SODIUM (PORCINE) 5000 UNIT/ML IJ SOLN
Freq: Once | INTRAMUSCULAR | Status: AC
  Filled 2021-09-06: qty 5

## 2021-09-06 NOTE — Progress Notes (Signed)
Accessed Medtronic pump LUQ abdomen using sterile technique without difficulty. Aspirated 4.6 ml (Ipad noted 4.6 ml). Flushed with 20 ml NS with 25,000 units heparin aspirating every 3 ml. Needle removed and bandaid applied. Will return 10/09/21 for next refill. Tolerated procedure well.

## 2021-09-06 NOTE — Patient Instructions (Signed)
Heparin injection What is this medication? HEPARIN (HEP a rin) is an anticoagulant. It is used to treat or prevent clots in the veins, arteries, lungs, or heart. It stops clots from forming or getting bigger. This medicine prevents clotting during open-heart surgery, dialysis, or in patients who are confined to bed. This medicine may be used for other purposes; ask your health care provider or pharmacist if you have questions. COMMON BRAND NAME(S): Hep-Lock, Hep-Lock U/P, Hepflush-10, Monoject Prefill Advanced Heparin Lock Flush, SASH Normal Saline and Heparin What should I tell my care team before I take this medication? They need to know if you have any of these conditions: bleeding disorders, such as hemophilia or low blood platelets bowel disease or diverticulitis endocarditis high blood pressure liver disease recent surgery or delivery of a baby stomach ulcers an unusual or allergic reaction to heparin, benzyl alcohol, sulfites, other medicines, foods, dyes, or preservatives pregnant or trying to get pregnant breast-feeding How should I use this medication? This medicine is given by injection or infusion into a vein. It can also be given by injection of small amounts under the skin. It is usually given by a health care professional in a hospital or clinic setting. If you get this medicine at home, you will be taught how to prepare and give this medicine. Use exactly as directed. Take your medicine at regular intervals. Do not take it more often than directed. Do not stop taking except on your doctor's advice. Stopping this medicine may increase your risk of a blot clot. Be sure to refill your prescription before you run out of medicine. It is important that you put your used needles and syringes in a special sharps container. Do not put them in a trash can. If you do not have a sharps container, call your pharmacist or healthcare provider to get one. Talk to your pediatrician regarding the  use of this medicine in children. While this medicine may be prescribed for children for selected conditions, precautions do apply. Overdosage: If you think you have taken too much of this medicine contact a poison control center or emergency room at once. NOTE: This medicine is only for you. Do not share this medicine with others. What if I miss a dose? If you miss a dose, take it as soon as you can. If it is almost time for your next dose, take only that dose. Do not take double or extra doses. What may interact with this medication? Do not take this medicine with any of the following medications: aspirin and aspirin-like drugs mifepristone medicines that treat or prevent blood clots like warfarin, enoxaparin, and dalteparin palifermin protamine This medicine may also interact with the following medications: dextran digoxin hydroxychloroquine medicines for treating colds or allergies nicotine NSAIDs, medicines for pain and inflammation, like ibuprofen or naproxen phenylbutazone tetracycline antibiotics This list may not describe all possible interactions. Give your health care provider a list of all the medicines, herbs, non-prescription drugs, or dietary supplements you use. Also tell them if you smoke, drink alcohol, or use illegal drugs. Some items may interact with your medicine. What should I watch for while using this medication? Visit your healthcare professional for regular checks on your progress. You may need blood work done while you are taking this medicine. Your condition will be monitored carefully while you are receiving this medicine. It is important not to miss any appointments. Wear a medical ID bracelet or chain, and carry a card that describes your disease and details   of your medicine and dosage times. Notify your doctor or healthcare professional at once if you have cold, blue hands or feet. If you are going to need surgery or other procedure, tell your healthcare  professional that you are using this medicine. Avoid sports and activities that might cause injury while you are using this medicine. Severe falls or injuries can cause unseen bleeding. Be careful when using sharp tools or knives. Consider using an Copy. Take special care brushing or flossing your teeth. Report any injuries, bruising, or red spots on the skin to your healthcare professional. Using this medicine for a long time may weaken your bones and increase the risk of bone fractures. You should make sure that you get enough calcium and vitamin D while you are taking this medicine. Discuss the foods you eat and the vitamins you take with your healthcare professional. Wear a medical ID bracelet or chain. Carry a card that describes your disease and details of your medicine and dosage times. What side effects may I notice from receiving this medication? Side effects that you should report to your doctor or health care professional as soon as possible: allergic reactions like skin rash, itching or hives, swelling of the face, lips, or tongue bone pain fever, chills nausea, vomiting signs and symptoms of bleeding such as bloody or black, tarry stools; red or dark-brown urine; spitting up blood or brown material that looks like coffee grounds; red spots on the skin; unusual bruising or bleeding from the eye, gums, or nose signs and symptoms of a blood clot such as chest pain; shortness of breath; pain, swelling, or warmth in the leg signs and symptoms of a stroke such as changes in vision; confusion; trouble speaking or understanding; severe headaches; sudden numbness or weakness of the face, arm or leg; trouble walking; dizziness; loss of coordination Side effects that usually do not require medical attention (report to your doctor or health care professional if they continue or are bothersome): hair loss pain, redness, or irritation at site where injected This list may not describe all  possible side effects. Call your doctor for medical advice about side effects. You may report side effects to FDA at 1-800-FDA-1088. Where should I keep my medication? Keep out of the reach of children. Store unopened vials at room temperature between 15 and 30 degrees C (59 and 86 degrees F). Do not freeze. Do not use if solution is discolored or particulate matter is present. Throw away any unused medicine after the expiration date. NOTE: This sheet is a summary. It may not cover all possible information. If you have questions about this medicine, talk to your doctor, pharmacist, or health care provider.  2022 [employer]/Gold Standard (2020-12-15 11:02:57)

## 2021-10-04 ENCOUNTER — Inpatient Hospital Stay: Payer: BC Managed Care – PPO | Attending: Oncology

## 2021-10-04 ENCOUNTER — Inpatient Hospital Stay: Payer: BC Managed Care – PPO

## 2021-10-04 ENCOUNTER — Other Ambulatory Visit: Payer: Self-pay

## 2021-10-04 ENCOUNTER — Ambulatory Visit: Payer: BC Managed Care – PPO

## 2021-10-04 VITALS — BP 128/77 | HR 77 | Temp 98.4°F | Resp 20 | Ht 68.0 in | Wt 143.5 lb

## 2021-10-04 DIAGNOSIS — Z85038 Personal history of other malignant neoplasm of large intestine: Secondary | ICD-10-CM | POA: Insufficient documentation

## 2021-10-04 DIAGNOSIS — C187 Malignant neoplasm of sigmoid colon: Secondary | ICD-10-CM

## 2021-10-04 DIAGNOSIS — Z95828 Presence of other vascular implants and grafts: Secondary | ICD-10-CM

## 2021-10-04 MED ORDER — HEPARIN SOD (PORK) LOCK FLUSH 100 UNIT/ML IV SOLN
500.0000 [IU] | Freq: Once | INTRAVENOUS | Status: AC | PRN
Start: 2021-10-04 — End: 2021-10-04
  Administered 2021-10-04: 500 [IU] via INTRAVENOUS

## 2021-10-04 MED ORDER — SODIUM CHLORIDE (PF) 0.9 % IJ SOLN
Freq: Once | INTRAMUSCULAR | Status: AC
  Filled 2021-10-04: qty 5

## 2021-10-04 MED ORDER — SODIUM CHLORIDE 0.9% FLUSH
10.0000 mL | INTRAVENOUS | Status: DC | PRN
  Administered 2021-10-04: 10 mL via INTRAVENOUS

## 2021-10-04 NOTE — Progress Notes (Signed)
Accessed Medtronic Pump LUQ abdomen using sterile technique without difficulty. Aspirated 3.2 ml (Ipad noted 3.2 ml). Flushed with 20 ml NS with 25,000 units heparin aspirating every 3 ml. Needle removed and bandaid applied. Will return 11/06/21 for next refill. Tolerated procedure well.

## 2021-10-05 ENCOUNTER — Encounter: Payer: Self-pay | Admitting: *Deleted

## 2021-10-28 ENCOUNTER — Other Ambulatory Visit: Payer: Self-pay

## 2021-10-28 ENCOUNTER — Inpatient Hospital Stay: Payer: BC Managed Care – PPO | Attending: Oncology

## 2021-10-28 VITALS — BP 121/81 | HR 70 | Temp 98.2°F | Resp 20

## 2021-10-28 DIAGNOSIS — C187 Malignant neoplasm of sigmoid colon: Secondary | ICD-10-CM | POA: Insufficient documentation

## 2021-10-28 DIAGNOSIS — C779 Secondary and unspecified malignant neoplasm of lymph node, unspecified: Secondary | ICD-10-CM | POA: Insufficient documentation

## 2021-10-28 DIAGNOSIS — Z95828 Presence of other vascular implants and grafts: Secondary | ICD-10-CM

## 2021-10-28 MED ORDER — SODIUM CHLORIDE (PF) 0.9 % IJ SOLN
Freq: Once | INTRAMUSCULAR | Status: AC
  Filled 2021-10-28: qty 5

## 2021-10-28 NOTE — Progress Notes (Signed)
Accessed Medtronic Pump LUQ abdomen using sterile technique without difficulty. Aspirated 5.2 ml (Ipad noted 5.6 ml). Flushed with 20 ml NS with 25,000 units heparin aspirating every 3 ml. Needle removed and bandaid applied. Will return 11/25/2021 for next refill. Tolerated procedure well.

## 2021-11-01 ENCOUNTER — Ambulatory Visit: Payer: BC Managed Care – PPO

## 2021-11-22 ENCOUNTER — Other Ambulatory Visit: Payer: Self-pay | Admitting: Obstetrics and Gynecology

## 2021-11-22 DIAGNOSIS — Z1231 Encounter for screening mammogram for malignant neoplasm of breast: Secondary | ICD-10-CM

## 2021-11-25 ENCOUNTER — Other Ambulatory Visit: Payer: Self-pay

## 2021-11-25 ENCOUNTER — Inpatient Hospital Stay: Payer: BC Managed Care – PPO | Attending: Oncology

## 2021-11-25 VITALS — BP 108/82 | HR 75 | Temp 98.5°F | Resp 20 | Ht 68.0 in | Wt 142.0 lb

## 2021-11-25 DIAGNOSIS — Z95828 Presence of other vascular implants and grafts: Secondary | ICD-10-CM

## 2021-11-25 DIAGNOSIS — C187 Malignant neoplasm of sigmoid colon: Secondary | ICD-10-CM | POA: Insufficient documentation

## 2021-11-25 MED ORDER — SODIUM CHLORIDE (PF) 0.9 % IJ SOLN
Freq: Once | INTRAMUSCULAR | Status: AC
  Filled 2021-11-25: qty 5

## 2021-11-25 NOTE — Progress Notes (Signed)
Accessed Medtronic Pump LUQ abdomen using sterile technique without difficulty. Aspirated 3.0 ml (Ipad noted 3.2 ml). Flushed with 20 ml NS with 25,000 units heparin aspirating every 3 ml. Needle removed and bandaid applied. Will return 12/28/2021 for next refill. Tolerated procedure well.

## 2021-12-13 ENCOUNTER — Ambulatory Visit
Admission: RE | Admit: 2021-12-13 | Discharge: 2021-12-13 | Disposition: A | Discharge status: Home / Self Care | Payer: BC Managed Care – PPO | Source: Ambulatory Visit | Attending: Obstetrics and Gynecology | Admitting: Obstetrics and Gynecology

## 2021-12-13 ENCOUNTER — Other Ambulatory Visit: Payer: Self-pay

## 2021-12-13 DIAGNOSIS — Z1231 Encounter for screening mammogram for malignant neoplasm of breast: Secondary | ICD-10-CM

## 2021-12-23 ENCOUNTER — Other Ambulatory Visit: Payer: Self-pay

## 2021-12-23 ENCOUNTER — Inpatient Hospital Stay: Payer: BC Managed Care – PPO | Attending: Oncology

## 2021-12-23 VITALS — BP 122/89 | HR 81 | Temp 97.7°F | Resp 20 | Ht 68.0 in | Wt 140.2 lb

## 2021-12-23 DIAGNOSIS — D6959 Other secondary thrombocytopenia: Secondary | ICD-10-CM | POA: Diagnosis not present

## 2021-12-23 DIAGNOSIS — D709 Neutropenia, unspecified: Secondary | ICD-10-CM | POA: Diagnosis not present

## 2021-12-23 DIAGNOSIS — Z85038 Personal history of other malignant neoplasm of large intestine: Secondary | ICD-10-CM | POA: Diagnosis not present

## 2021-12-23 DIAGNOSIS — C187 Malignant neoplasm of sigmoid colon: Secondary | ICD-10-CM

## 2021-12-23 DIAGNOSIS — Z95828 Presence of other vascular implants and grafts: Secondary | ICD-10-CM

## 2021-12-23 MED ORDER — SODIUM CHLORIDE 0.9% FLUSH
10.0000 mL | INTRAVENOUS | Status: DC | PRN
  Administered 2021-12-23: 10 mL via INTRAVENOUS

## 2021-12-23 MED ORDER — SODIUM CHLORIDE (PF) 0.9 % IJ SOLN
Freq: Once | INTRAMUSCULAR | Status: AC
Start: 2021-12-23 — End: 2021-12-23
  Filled 2021-12-23: qty 5

## 2021-12-23 MED ORDER — HEPARIN SOD (PORK) LOCK FLUSH 100 UNIT/ML IV SOLN
500.0000 [IU] | Freq: Once | INTRAVENOUS | Status: AC | PRN
  Administered 2021-12-23: 500 [IU] via INTRAVENOUS

## 2021-12-23 NOTE — Progress Notes (Signed)
Accessed Medtronic Pump LUQ abdomen using sterile technique without difficulty. Aspirated 3.2 ml (Ipad noted 3.2 ml). Flushed with 20 ml NS with 25,000 units heparin aspirating every 3 ml. Needle removed and bandaid applied. Will return 01/20/2022 for next refill. Tolerated procedure well.

## 2021-12-23 NOTE — Patient Instructions (Addendum)
Heparin injection What is this medication? HEPARIN (HEP a rin) is an anticoagulant. It is used to treat or prevent clots in the veins, arteries, lungs, or heart. It stops clots from forming or getting bigger. This medicine prevents clotting during open-heart surgery, dialysis, or in patients who are confined to bed. This medicine may be used for other purposes; ask your health care provider or pharmacist if you have questions. COMMON BRAND NAME(S): Hep-Lock, Hep-Lock U/P, Hepflush-10, Monoject Prefill Advanced Heparin Lock Flush, SASH Normal Saline and Heparin What should I tell my care team before I take this medication? They need to know if you have any of these conditions: bleeding disorders, such as hemophilia or low blood platelets bowel disease or diverticulitis endocarditis high blood pressure liver disease recent surgery or delivery of a baby stomach ulcers an unusual or allergic reaction to heparin, benzyl alcohol, sulfites, other medicines, foods, dyes, or preservatives pregnant or trying to get pregnant breast-feeding How should I use this medication? This medicine is given by injection or infusion into a vein. It can also be given by injection of small amounts under the skin. It is usually given by a health care professional in a hospital or clinic setting. If you get this medicine at home, you will be taught how to prepare and give this medicine. Use exactly as directed. Take your medicine at regular intervals. Do not take it more often than directed. Do not stop taking except on your doctor's advice. Stopping this medicine may increase your risk of a blot clot. Be sure to refill your prescription before you run out of medicine. It is important that you put your used needles and syringes in a special sharps container. Do not put them in a trash can. If you do not have a sharps container, call your pharmacist or healthcare provider to get one. Talk to your pediatrician regarding the  use of this medicine in children. While this medicine may be prescribed for children for selected conditions, precautions do apply. Overdosage: If you think you have taken too much of this medicine contact a poison control center or emergency room at once. NOTE: This medicine is only for you. Do not share this medicine with others. What if I miss a dose? If you miss a dose, take it as soon as you can. If it is almost time for your next dose, take only that dose. Do not take double or extra doses. What may interact with this medication? Do not take this medicine with any of the following medications: aspirin and aspirin-like drugs mifepristone medicines that treat or prevent blood clots like warfarin, enoxaparin, and dalteparin palifermin protamine This medicine may also interact with the following medications: dextran digoxin hydroxychloroquine medicines for treating colds or allergies nicotine NSAIDs, medicines for pain and inflammation, like ibuprofen or naproxen phenylbutazone tetracycline antibiotics This list may not describe all possible interactions. Give your health care provider a list of all the medicines, herbs, non-prescription drugs, or dietary supplements you use. Also tell them if you smoke, drink alcohol, or use illegal drugs. Some items may interact with your medicine. What should I watch for while using this medication? Visit your healthcare professional for regular checks on your progress. You may need blood work done while you are taking this medicine. Your condition will be monitored carefully while you are receiving this medicine. It is important not to miss any appointments. Wear a medical ID bracelet or chain, and carry a card that describes your disease and details  of your medicine and dosage times. Notify your doctor or healthcare professional at once if you have cold, blue hands or feet. If you are going to need surgery or other procedure, tell your healthcare  professional that you are using this medicine. Avoid sports and activities that might cause injury while you are using this medicine. Severe falls or injuries can cause unseen bleeding. Be careful when using sharp tools or knives. Consider using an Copy. Take special care brushing or flossing your teeth. Report any injuries, bruising, or red spots on the skin to your healthcare professional. Using this medicine for a long time may weaken your bones and increase the risk of bone fractures. You should make sure that you get enough calcium and vitamin D while you are taking this medicine. Discuss the foods you eat and the vitamins you take with your healthcare professional. Wear a medical ID bracelet or chain. Carry a card that describes your disease and details of your medicine and dosage times. What side effects may I notice from receiving this medication? Side effects that you should report to your doctor or health care professional as soon as possible: allergic reactions like skin rash, itching or hives, swelling of the face, lips, or tongue bone pain fever, chills nausea, vomiting signs and symptoms of bleeding such as bloody or black, tarry stools; red or dark-brown urine; spitting up blood or brown material that looks like coffee grounds; red spots on the skin; unusual bruising or bleeding from the eye, gums, or nose signs and symptoms of a blood clot such as chest pain; shortness of breath; pain, swelling, or warmth in the leg signs and symptoms of a stroke such as changes in vision; confusion; trouble speaking or understanding; severe headaches; sudden numbness or weakness of the face, arm or leg; trouble walking; dizziness; loss of coordination Side effects that usually do not require medical attention (report to your doctor or health care professional if they continue or are bothersome): hair loss pain, redness, or irritation at site where injected This list may not describe all  possible side effects. Call your doctor for medical advice about side effects. You may report side effects to FDA at 1-800-FDA-1088. Where should I keep my medication? Keep out of the reach of children. Store unopened vials at room temperature between 15 and 30 degrees C (59 and 86 degrees F). Do not freeze. Do not use if solution is discolored or particulate matter is present. Throw away any unused medicine after the expiration date. NOTE: This sheet is a summary. It may not cover all possible information. If you have questions about this medicine, talk to your doctor, pharmacist, or health care provider.  2022 [employer]vier/Gold Standard (2020-12-16 00:00:00)

## 2022-01-20 ENCOUNTER — Inpatient Hospital Stay: Payer: BC Managed Care – PPO | Attending: Oncology

## 2022-01-20 ENCOUNTER — Other Ambulatory Visit: Payer: Self-pay

## 2022-01-20 VITALS — BP 122/91 | HR 89 | Temp 98.1°F | Resp 20 | Ht 68.0 in | Wt 144.1 lb

## 2022-01-20 DIAGNOSIS — Z85038 Personal history of other malignant neoplasm of large intestine: Secondary | ICD-10-CM | POA: Insufficient documentation

## 2022-01-20 DIAGNOSIS — Z452 Encounter for adjustment and management of vascular access device: Secondary | ICD-10-CM | POA: Diagnosis present

## 2022-01-20 DIAGNOSIS — C187 Malignant neoplasm of sigmoid colon: Secondary | ICD-10-CM

## 2022-01-20 DIAGNOSIS — Z95828 Presence of other vascular implants and grafts: Secondary | ICD-10-CM

## 2022-01-20 MED ORDER — SODIUM CHLORIDE (PF) 0.9 % IJ SOLN
Freq: Once | INTRAMUSCULAR | Status: AC
  Filled 2022-01-20: qty 5

## 2022-01-20 NOTE — Patient Instructions (Signed)
Heparin injection ?What is this medication? ?HEPARIN (HEP a rin) is an anticoagulant. It is used to treat or prevent clots in the veins, arteries, lungs, or heart. It stops clots from forming or getting bigger. This medicine prevents clotting during open-heart surgery, dialysis, or in patients who are confined to bed. ?This medicine may be used for other purposes; ask your health care provider or pharmacist if you have questions. ?COMMON BRAND NAME(S): Hep-Lock, Hep-Lock U/P, Hepflush-10, Monoject Prefill Advanced Heparin Lock Flush, SASH Normal Saline and Heparin ?What should I tell my care team before I take this medication? ?They need to know if you have any of these conditions: ?bleeding disorders, such as hemophilia or low blood platelets ?bowel disease or diverticulitis ?endocarditis ?high blood pressure ?liver disease ?recent surgery or delivery of a baby ?stomach ulcers ?an unusual or allergic reaction to heparin, benzyl alcohol, sulfites, other medicines, foods, dyes, or preservatives ?pregnant or trying to get pregnant ?breast-feeding ?How should I use this medication? ?This medicine is given by injection or infusion into a vein. It can also be given by injection of small amounts under the skin. It is usually given by a health care professional in a hospital or clinic setting. ?If you get this medicine at home, you will be taught how to prepare and give this medicine. Use exactly as directed. Take your medicine at regular intervals. Do not take it more often than directed. Do not stop taking except on your doctor's advice. Stopping this medicine may increase your risk of a blot clot. Be sure to refill your prescription before you run out of medicine. ?It is important that you put your used needles and syringes in a special sharps container. Do not put them in a trash can. If you do not have a sharps container, call your pharmacist or healthcare provider to get one. ?Talk to your pediatrician regarding the  use of this medicine in children. While this medicine may be prescribed for children for selected conditions, precautions do apply. ?Overdosage: If you think you have taken too much of this medicine contact a poison control center or emergency room at once. ?NOTE: This medicine is only for you. Do not share this medicine with others. ?What if I miss a dose? ?If you miss a dose, take it as soon as you can. If it is almost time for your next dose, take only that dose. Do not take double or extra doses. ?What may interact with this medication? ?Do not take this medicine with any of the following medications: ?aspirin and aspirin-like drugs ?mifepristone ?medicines that treat or prevent blood clots like warfarin, enoxaparin, and dalteparin ?palifermin ?protamine ?This medicine may also interact with the following medications: ?dextran ?digoxin ?hydroxychloroquine ?medicines for treating colds or allergies ?nicotine ?NSAIDs, medicines for pain and inflammation, like ibuprofen or naproxen ?phenylbutazone ?tetracycline antibiotics ?This list may not describe all possible interactions. Give your health care provider a list of all the medicines, herbs, non-prescription drugs, or dietary supplements you use. Also tell them if you smoke, drink alcohol, or use illegal drugs. Some items may interact with your medicine. ?What should I watch for while using this medication? ?Visit your healthcare professional for regular checks on your progress. You may need blood work done while you are taking this medicine. Your condition will be monitored carefully while you are receiving this medicine. It is important not to miss any appointments. ?Wear a medical ID bracelet or chain, and carry a card that describes your disease and details   of your medicine and dosage times. ?Notify your doctor or healthcare professional at once if you have cold, blue hands or feet. ?If you are going to need surgery or other procedure, tell your healthcare  professional that you are using this medicine. ?Avoid sports and activities that might cause injury while you are using this medicine. Severe falls or injuries can cause unseen bleeding. Be careful when using sharp tools or knives. Consider using an Copy. Take special care brushing or flossing your teeth. Report any injuries, bruising, or red spots on the skin to your healthcare professional. ?Using this medicine for a long time may weaken your bones and increase the risk of bone fractures. ?You should make sure that you get enough calcium and vitamin D while you are taking this medicine. Discuss the foods you eat and the vitamins you take with your healthcare professional. ?Wear a medical ID bracelet or chain. Carry a card that describes your disease and details of your medicine and dosage times. ?What side effects may I notice from receiving this medication? ?Side effects that you should report to your doctor or health care professional as soon as possible: ?allergic reactions like skin rash, itching or hives, swelling of the face, lips, or tongue ?bone pain ?fever, chills ?nausea, vomiting ?signs and symptoms of bleeding such as bloody or black, tarry stools; red or dark-brown urine; spitting up blood or brown material that looks like coffee grounds; red spots on the skin; unusual bruising or bleeding from the eye, gums, or nose ?signs and symptoms of a blood clot such as chest pain; shortness of breath; pain, swelling, or warmth in the leg ?signs and symptoms of a stroke such as changes in vision; confusion; trouble speaking or understanding; severe headaches; sudden numbness or weakness of the face, arm or leg; trouble walking; dizziness; loss of coordination ?Side effects that usually do not require medical attention (report to your doctor or health care professional if they continue or are bothersome): ?hair loss ?pain, redness, or irritation at site where injected ?This list may not describe all  possible side effects. Call your doctor for medical advice about side effects. You may report side effects to FDA at 1-800-FDA-1088. ?Where should I keep my medication? ?Keep out of the reach of children. ?Store unopened vials at room temperature between 15 and 30 degrees C (59 and 86 degrees F). Do not freeze. Do not use if solution is discolored or particulate matter is present. Throw away any unused medicine after the expiration date. ?NOTE: This sheet is a summary. It may not cover all possible information. If you have questions about this medicine, talk to your doctor, pharmacist, or health care provider. ?? 2022 Elsevier/Gold Standard (2020-12-16 00:00:00) ? ?

## 2022-01-20 NOTE — Progress Notes (Signed)
Accessed Medtronic Pump LUQ abdomen using sterile technique without difficulty. Aspirated 3.2 ml (Ipad noted 3.2 ml). Flushed with 20 ml NS with 25,000 units heparin aspirating every 3 ml. Needle removed and bandaid applied. Will return 02/17/2022 for next refill. Tolerated procedure well. ?

## 2022-02-17 ENCOUNTER — Inpatient Hospital Stay: Payer: BC Managed Care – PPO

## 2022-02-17 VITALS — BP 115/79 | HR 80 | Temp 98.4°F | Resp 18 | Ht 68.0 in | Wt 141.0 lb

## 2022-02-17 DIAGNOSIS — Z452 Encounter for adjustment and management of vascular access device: Secondary | ICD-10-CM | POA: Diagnosis not present

## 2022-02-17 DIAGNOSIS — Z85038 Personal history of other malignant neoplasm of large intestine: Secondary | ICD-10-CM | POA: Diagnosis not present

## 2022-02-17 DIAGNOSIS — C187 Malignant neoplasm of sigmoid colon: Secondary | ICD-10-CM

## 2022-02-17 DIAGNOSIS — Z95828 Presence of other vascular implants and grafts: Secondary | ICD-10-CM

## 2022-02-17 MED ORDER — SODIUM CHLORIDE 0.9% FLUSH: 10.0000 mL | INTRAVENOUS | Status: DC | PRN

## 2022-02-17 MED ORDER — HEPARIN SOD (PORK) LOCK FLUSH 100 UNIT/ML IV SOLN: 500.0000 [IU] | Freq: Once | INTRAVENOUS | Status: DC | PRN

## 2022-02-17 MED ORDER — SODIUM CHLORIDE (PF) 0.9 % IJ SOLN
Freq: Once | INTRAMUSCULAR | Status: AC
  Filled 2022-02-17: qty 5

## 2022-02-17 NOTE — Progress Notes (Signed)
Accessed Medtronic Pump LUQ abdomen using sterile technique without difficulty. Aspirated 3.2 ml (Ipad noted 3.2 ml). Flushed with 20 ml NS with 25,000 units heparin aspirating every 3 ml. Needle removed and bandaid applied. Will return 03/24/2022 for next refill. Tolerated procedure well. ?

## 2022-02-17 NOTE — Patient Instructions (Signed)
Heparin injection ?What is this medication? ?HEPARIN (HEP a rin) is an anticoagulant. It is used to treat or prevent clots in the veins, arteries, lungs, or heart. It stops clots from forming or getting bigger. This medicine prevents clotting during open-heart surgery, dialysis, or in patients who are confined to bed. ?This medicine may be used for other purposes; ask your health care provider or pharmacist if you have questions. ?COMMON BRAND NAME(S): Hep-Lock, Hep-Lock U/P, Hepflush-10, Monoject Prefill Advanced Heparin Lock Flush, SASH Normal Saline and Heparin ?What should I tell my care team before I take this medication? ?They need to know if you have any of these conditions: ?bleeding disorders, such as hemophilia or low blood platelets ?bowel disease or diverticulitis ?endocarditis ?high blood pressure ?liver disease ?recent surgery or delivery of a baby ?stomach ulcers ?an unusual or allergic reaction to heparin, benzyl alcohol, sulfites, other medicines, foods, dyes, or preservatives ?pregnant or trying to get pregnant ?breast-feeding ?How should I use this medication? ?This medicine is given by injection or infusion into a vein. It can also be given by injection of small amounts under the skin. It is usually given by a health care professional in a hospital or clinic setting. ?If you get this medicine at home, you will be taught how to prepare and give this medicine. Use exactly as directed. Take your medicine at regular intervals. Do not take it more often than directed. Do not stop taking except on your doctor's advice. Stopping this medicine may increase your risk of a blot clot. Be sure to refill your prescription before you run out of medicine. ?It is important that you put your used needles and syringes in a special sharps container. Do not put them in a trash can. If you do not have a sharps container, call your pharmacist or healthcare provider to get one. ?Talk to your pediatrician regarding the  use of this medicine in children. While this medicine may be prescribed for children for selected conditions, precautions do apply. ?Overdosage: If you think you have taken too much of this medicine contact a poison control center or emergency room at once. ?NOTE: This medicine is only for you. Do not share this medicine with others. ?What if I miss a dose? ?If you miss a dose, take it as soon as you can. If it is almost time for your next dose, take only that dose. Do not take double or extra doses. ?What may interact with this medication? ?Do not take this medicine with any of the following medications: ?aspirin and aspirin-like drugs ?mifepristone ?medicines that treat or prevent blood clots like warfarin, enoxaparin, and dalteparin ?palifermin ?protamine ?This medicine may also interact with the following medications: ?dextran ?digoxin ?hydroxychloroquine ?medicines for treating colds or allergies ?nicotine ?NSAIDs, medicines for pain and inflammation, like ibuprofen or naproxen ?phenylbutazone ?tetracycline antibiotics ?This list may not describe all possible interactions. Give your health care provider a list of all the medicines, herbs, non-prescription drugs, or dietary supplements you use. Also tell them if you smoke, drink alcohol, or use illegal drugs. Some items may interact with your medicine. ?What should I watch for while using this medication? ?Visit your healthcare professional for regular checks on your progress. You may need blood work done while you are taking this medicine. Your condition will be monitored carefully while you are receiving this medicine. It is important not to miss any appointments. ?Wear a medical ID bracelet or chain, and carry a card that describes your disease and details   of your medicine and dosage times. ?Notify your doctor or healthcare professional at once if you have cold, blue hands or feet. ?If you are going to need surgery or other procedure, tell your healthcare  professional that you are using this medicine. ?Avoid sports and activities that might cause injury while you are using this medicine. Severe falls or injuries can cause unseen bleeding. Be careful when using sharp tools or knives. Consider using an Copy. Take special care brushing or flossing your teeth. Report any injuries, bruising, or red spots on the skin to your healthcare professional. ?Using this medicine for a long time may weaken your bones and increase the risk of bone fractures. ?You should make sure that you get enough calcium and vitamin D while you are taking this medicine. Discuss the foods you eat and the vitamins you take with your healthcare professional. ?Wear a medical ID bracelet or chain. Carry a card that describes your disease and details of your medicine and dosage times. ?What side effects may I notice from receiving this medication? ?Side effects that you should report to your doctor or health care professional as soon as possible: ?allergic reactions like skin rash, itching or hives, swelling of the face, lips, or tongue ?bone pain ?fever, chills ?nausea, vomiting ?signs and symptoms of bleeding such as bloody or black, tarry stools; red or dark-brown urine; spitting up blood or brown material that looks like coffee grounds; red spots on the skin; unusual bruising or bleeding from the eye, gums, or nose ?signs and symptoms of a blood clot such as chest pain; shortness of breath; pain, swelling, or warmth in the leg ?signs and symptoms of a stroke such as changes in vision; confusion; trouble speaking or understanding; severe headaches; sudden numbness or weakness of the face, arm or leg; trouble walking; dizziness; loss of coordination ?Side effects that usually do not require medical attention (report to your doctor or health care professional if they continue or are bothersome): ?hair loss ?pain, redness, or irritation at site where injected ?This list may not describe all  possible side effects. Call your doctor for medical advice about side effects. You may report side effects to FDA at 1-800-FDA-1088. ?Where should I keep my medication? ?Keep out of the reach of children. ?Store unopened vials at room temperature between 15 and 30 degrees C (59 and 86 degrees F). Do not freeze. Do not use if solution is discolored or particulate matter is present. Throw away any unused medicine after the expiration date. ?NOTE: This sheet is a summary. It may not cover all possible information. If you have questions about this medicine, talk to your doctor, pharmacist, or health care provider. ?? 2022 Elsevier/Gold Standard (2020-12-16 00:00:00) ? ?

## 2022-02-21 ENCOUNTER — Ambulatory Visit (HOSPITAL_BASED_OUTPATIENT_CLINIC_OR_DEPARTMENT_OTHER)
Admission: RE | Admit: 2022-02-21 | Discharge: 2022-02-21 | Disposition: A | Discharge status: Home / Self Care | Payer: BC Managed Care – PPO | Source: Ambulatory Visit | Attending: Oncology | Admitting: Oncology

## 2022-02-21 ENCOUNTER — Inpatient Hospital Stay: Payer: BC Managed Care – PPO

## 2022-02-21 ENCOUNTER — Inpatient Hospital Stay: Payer: BC Managed Care – PPO | Attending: Oncology | Admitting: Oncology

## 2022-02-21 ENCOUNTER — Encounter (HOSPITAL_BASED_OUTPATIENT_CLINIC_OR_DEPARTMENT_OTHER): Payer: Self-pay

## 2022-02-21 VITALS — BP 160/90 | HR 100 | Temp 97.7°F | Resp 18 | Ht 68.0 in | Wt 140.4 lb

## 2022-02-21 DIAGNOSIS — C187 Malignant neoplasm of sigmoid colon: Secondary | ICD-10-CM | POA: Insufficient documentation

## 2022-02-21 DIAGNOSIS — Z85038 Personal history of other malignant neoplasm of large intestine: Secondary | ICD-10-CM | POA: Insufficient documentation

## 2022-02-21 LAB — BASIC METABOLIC PANEL - CANCER CENTER ONLY
Anion gap: 10 (ref 5–15)
BUN: 12 mg/dL (ref 6–20)
CO2: 25 mmol/L (ref 22–32)
Calcium: 10.1 mg/dL (ref 8.9–10.3)
Chloride: 105 mmol/L (ref 98–111)
Creatinine: 0.77 mg/dL (ref 0.44–1.00)
GFR, Estimated: >60 mL/min (ref >60)
Glucose, Bld: 100 mg/dL — ABNORMAL HIGH (ref 70–99)
Potassium: 4.1 mmol/L (ref 3.5–5.1)
Sodium: 140 mmol/L (ref 135–145)

## 2022-02-21 LAB — CEA (ACCESS): CEA (CHCC): <1 ng/mL (ref 0.00–5.00)

## 2022-02-21 MED ORDER — HEPARIN SOD (PORK) LOCK FLUSH 100 UNIT/ML IV SOLN
500.0000 [IU] | Freq: Once | INTRAVENOUS | Status: AC
  Administered 2022-02-21: 500 [IU] via INTRAVENOUS

## 2022-02-21 MED ORDER — IOHEXOL 300 MG/ML  SOLN
100.0000 mL | Freq: Once | INTRAMUSCULAR | Status: AC | PRN
  Administered 2022-02-21: 80 mL via INTRAVENOUS

## 2022-02-21 NOTE — Progress Notes (Signed)
?[facility] ?OFFICE PROGRESS NOTE ? ? ?Diagnosis: Colon cancer ? ?INTERVAL HISTORY:  ? ?Patricia Hudson returns as scheduled.  She feels well.  No complaint.  She continues to have the abdominal infusion pump flushed monthly.  The Port-A-Cath is flushed every 8 weeks. ? ?Objective: ? ?Vital signs in last 24 hours: ? ?Blood pressure (!) 160/90, pulse 100, temperature 97.7 ?F (36.5 ?C), temperature source Oral, resp. rate 18, height '5\' 8"'  (1.727 m), weight 140 lb 6.4 oz (63.7 kg), last menstrual period 04/20/2013, SpO2 100 %. ?  ? ?HEENT: Neck without mass ?Lymphatics: No cervical, supraclavicular, axillary, or inguinal nodes ?Resp: Lungs clear bilaterally ?Cardio: Regular rate and rhythm ?GI: Left abdomen infusion pump, no hepatosplenomegaly ?Vascular: No leg edema  ?Skin: 1.5 cm soft mobile cutaneous nodule at the right lower neck ? ?Portacath/PICC-without erythema ? ?Lab Results: ? ?Lab Results  ?Component Value Date  ? WBC 4.6 02/21/2021  ? HGB 13.3 02/21/2021  ? HCT 39.5 02/21/2021  ? MCV 91.2 02/21/2021  ? PLT 181 02/21/2021  ? NEUTROABS 3.0 02/21/2021  ? ? ?CMP  ?Lab Results  ?Component Value Date  ? NA 140 02/21/2022  ? K 4.1 02/21/2022  ? CL 105 02/21/2022  ? CO2 25 02/21/2022  ? GLUCOSE 100 (H) 02/21/2022  ? BUN 12 02/21/2022  ? CREATININE 0.77 02/21/2022  ? CALCIUM 10.1 02/21/2022  ? PROT 7.2 08/18/2021  ? ALBUMIN 4.7 08/18/2021  ? AST 19 08/18/2021  ? ALT 12 08/18/2021  ? ALKPHOS 99 08/18/2021  ? BILITOT 0.5 08/18/2021  ? GFRNONAA >60 02/21/2022  ? GFRAA >60 01/27/2020  ? ? ?Lab Results  ?Component Value Date  ? CEA1 <1.00 08/18/2021  ? CEA <1.00 02/21/2022  ? ? ?Lab Results  ?Component Value Date  ? INR 1.27 07/13/2016  ? LABPROT 16.0 (H) 07/13/2016  ? ? ?Imaging: ? ?CT CHEST ABDOMEN PELVIS W CONTRAST ? ?Result Date: 02/21/2022 ?CLINICAL DATA:  Colorectal cancer, surveillance. * Tracking Code: BO * EXAM: CT CHEST, ABDOMEN, AND PELVIS WITH CONTRAST TECHNIQUE: Multidetector CT imaging of the chest,  abdomen and pelvis was performed following the standard protocol during bolus administration of intravenous contrast. RADIATION DOSE REDUCTION: This exam was performed according to the departmental dose-optimization program which includes automated exposure control, adjustment of the mA and/or kV according to patient size and/or use of iterative reconstruction technique. CONTRAST:  1m OMNIPAQUE IOHEXOL 300 MG/ML  SOLN COMPARISON:  CTs of the chest, abdomen and pelvis 08/18/2021 and 02/21/2021. FINDINGS: CT CHEST FINDINGS Cardiovascular: Right IJ Port-A-Cath extends to the superior cavoatrial junction. No significant vascular findings. The heart size is normal. There is no pericardial effusion. Mediastinum/Nodes: There are no enlarged mediastinal, hilar or axillary lymph nodes. The thyroid gland, trachea and esophagus demonstrate no significant findings. Lungs/Pleura: No pleural effusion or pneumothorax. Minimal biapical scarring. No suspicious pulmonary nodularity. Musculoskeletal/Chest wall: No chest wall mass or suspicious osseous findings. CT ABDOMEN AND PELVIS FINDINGS Hepatobiliary: The liver is normal in density without suspicious focal abnormality. There are stable postsurgical changes status post partial hepatectomy. Small a patent cysts are unchanged. No significant biliary dilatation status post cholecystectomy. Pancreas: Unremarkable. No pancreatic ductal dilatation or surrounding inflammatory changes. Spleen: Normal in size without focal abnormality. Adrenals/Urinary Tract: Both adrenal glands appear normal. The kidneys appear normal without evidence of urinary tract calculus, suspicious lesion or hydronephrosis. Stable probable small cyst in the interpolar region of the left kidney for which no follow-is necessary. No bladder abnormalities are seen. Stomach/Bowel: Enteric  contrast was administered and has passed into the distal colon. The stomach appears unremarkable for its degree of distension. No  evidence of bowel wall thickening, distention or surrounding inflammatory change. The appendix appears normal. There are stable postsurgical changes in the distal colon. Vascular/Lymphatic: There are no enlarged abdominal or pelvic lymph nodes. Mild aortic and branch vessel atherosclerosis. No acute vascular findings. Reproductive: The uterus and ovaries appear normal. No adnexal mass. Other: No ascites or peritoneal nodularity. Stable mild diastasis of the rectus abdominus muscles inferior to the umbilicus without significant hernia. Stable stimulator device within the left anterior abdominal wall with lead extending into the gastrohepatic ligament. Musculoskeletal: No acute or significant osseous findings. IMPRESSION: 1. Stable postsurgical findings from previous rectosigmoid resection and partial hepatectomy. 2. No evidence of local recurrence or metastatic disease. 3. No acute findings. 4. Mild Aortic Atherosclerosis (ICD10-I70.0). Electronically Signed   By: Richardean Sale M.D.   On: 02/21/2022 09:43   ? ?Medications: I have reviewed the patient's current medications. ? ? 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 7 mm inferior subcapsular right liver lesion suspicious for a metastasis ?PET scan 07/21/2016-hypermetabolic sigmoid colon mass, mildly hypermetabolic segment 6 liver lesion ?Laparoscopic assisted rectosigmoid colectomy and segment 8 liver resection 07/27/2016, pathology confirmed a T3, N2b, M1 tumor, 11/16 lymph nodes positive for metastatic carcinoma ?Foundation 1 testing found the tumor to be microsatellite stable, tumor mutation burden  low, no BRAF or RAS mutation ?No loss of mismatch repair protein expression, MSI-stable ?Cycle 1 CAPOX 08/22/2016 ?Cycle 2 CAPOX 09/12/2016 ?Cycle 3 CAPOX 10/03/2016 (Xeloda dose reduced to 3 tablets every morning and 2 tablets every evening beginning with cycle 3) ?Cycle 4 CAPOX 10/31/2016 (Xeloda re-escalated to original dose) ?Cycle 5 CAPOX 11/21/2016 ?Cycle 6 CAPOX 12/12/2016 ?Cycle 7 CAPOX 01/09/2017 (oxaliplatin held) ?Cycle 8 CAPOX 01/30/2017 (oxaliplatin dose reduced) ?CT scans 02/27/2017-negative for recurrent colon cancer ?CTs 08/14/2017-negative for recurrent cancer ?CTs 02/12/2018-negative for recurrent cancer ?CTs 07/29/2018-negative for recurrent cancer  ?CTs 01/27/2019-negative for recurrent cancer, new 8 mm subpleural left lower lobe nodule ?CT chest 04/29/2019- resolution of left lower lobe nodule ?CT 07/29/2019- negative for recurrent cancer ?CTs 01/27/2020-stable 4 mm right apical lung nodule.  New 1.6 x 1.6 cm lesion identified in the lateral segment left liver.  Stable 9 mm subcapsular lesion interpolar left kidney. ?MRI liver 3/10/ 2021-15 mm enhancing lesion in segment 3, new from September 2020, subcentimeter hemorrhagic cyst in the left kidney ?Robotic assisted partial hepatectomy (segment 3), cholecystectomy, portal lymphadenectomy, and insertion of hepatic infusion pump 02/12/2020, 0/3 nodes, 1.9 cm adenocarcinoma in segment 3, negative margins ?03/11/2020-FUDR and infusional 5-FU #1 ?04/08/2020-FUDR #2 ?05/06/2020-FUDR#3 ?06/03/2020-FUDR#4 canceled secondary to elevation of the alkaline phosphatase ?07/15/2020-FUDR#4, 50% dose reduction ?09/09/2000-CTs chest/abdomen/pelvis-negative ?12/02/2020- CTs negative for recurrent disease ?02/21/2021-CTs negative for recurrent disease, stable surgical changes in the liver ?CTs 08/18/2021-no evidence of recurrent disease, stable postoperative findings of partial hepatectomy ?CTs 02/21/2022-no evidence of recurrent disease ?  ?2.   History of rectal bleeding secondary to  #1 ?  ?3.   Multiple polyps on the colonoscopy 07/07/2016 ?Admission 07/13/2016 with  acute GI bleeding secondary to an ulcer at the cecum ?Colonoscopy 10/08/2017-small polyps removed from the cecum-co

## 2022-02-21 NOTE — Patient Instructions (Signed)

## 2022-03-17 ENCOUNTER — Inpatient Hospital Stay: Payer: BC Managed Care – PPO

## 2022-03-17 VITALS — BP 123/84 | HR 76 | Temp 98.0°F | Resp 18 | Ht 68.0 in | Wt 140.1 lb

## 2022-03-17 DIAGNOSIS — Z85038 Personal history of other malignant neoplasm of large intestine: Secondary | ICD-10-CM | POA: Insufficient documentation

## 2022-03-17 DIAGNOSIS — Z95828 Presence of other vascular implants and grafts: Secondary | ICD-10-CM

## 2022-03-17 DIAGNOSIS — C187 Malignant neoplasm of sigmoid colon: Secondary | ICD-10-CM

## 2022-03-17 MED ORDER — SODIUM CHLORIDE (PF) 0.9 % IJ SOLN
Freq: Once | INTRAMUSCULAR | Status: AC
  Filled 2022-03-17: qty 5

## 2022-03-17 NOTE — Progress Notes (Signed)
Accessed Medtronic Pump LUQ abdomen using sterile technique without difficulty. Aspirated 3.2 ml (Ipad noted 3.2 ml). Flushed with 20 ml NS with 25,000 units heparin aspirating every 3 ml. Needle removed and bandaid applied. Will return 04/19/2022 for next refill. Tolerated procedure well. ?

## 2022-03-17 NOTE — Patient Instructions (Signed)
Heparin injection ?What is this medication? ?HEPARIN (HEP a rin) is an anticoagulant. It is used to treat or prevent clots in the veins, arteries, lungs, or heart. It stops clots from forming or getting bigger. This medicine prevents clotting during open-heart surgery, dialysis, or in patients who are confined to bed. ?This medicine may be used for other purposes; ask your health care provider or pharmacist if you have questions. ?COMMON BRAND NAME(S): Hep-Lock, Hep-Lock U/P, Hepflush-10, Monoject Prefill Advanced Heparin Lock Flush, SASH Normal Saline and Heparin ?What should I tell my care team before I take this medication? ?They need to know if you have any of these conditions: ?bleeding disorders, such as hemophilia or low blood platelets ?bowel disease or diverticulitis ?endocarditis ?high blood pressure ?liver disease ?recent surgery or delivery of a baby ?stomach ulcers ?an unusual or allergic reaction to heparin, benzyl alcohol, sulfites, other medicines, foods, dyes, or preservatives ?pregnant or trying to get pregnant ?breast-feeding ?How should I use this medication? ?This medicine is given by injection or infusion into a vein. It can also be given by injection of small amounts under the skin. It is usually given by a health care professional in a hospital or clinic setting. ?If you get this medicine at home, you will be taught how to prepare and give this medicine. Use exactly as directed. Take your medicine at regular intervals. Do not take it more often than directed. Do not stop taking except on your doctor's advice. Stopping this medicine may increase your risk of a blot clot. Be sure to refill your prescription before you run out of medicine. ?It is important that you put your used needles and syringes in a special sharps container. Do not put them in a trash can. If you do not have a sharps container, call your pharmacist or healthcare provider to get one. ?Talk to your pediatrician regarding the  use of this medicine in children. While this medicine may be prescribed for children for selected conditions, precautions do apply. ?Overdosage: If you think you have taken too much of this medicine contact a poison control center or emergency room at once. ?NOTE: This medicine is only for you. Do not share this medicine with others. ?What if I miss a dose? ?If you miss a dose, take it as soon as you can. If it is almost time for your next dose, take only that dose. Do not take double or extra doses. ?What may interact with this medication? ?Do not take this medicine with any of the following medications: ?aspirin and aspirin-like drugs ?mifepristone ?medicines that treat or prevent blood clots like warfarin, enoxaparin, and dalteparin ?palifermin ?protamine ?This medicine may also interact with the following medications: ?dextran ?digoxin ?hydroxychloroquine ?medicines for treating colds or allergies ?nicotine ?NSAIDs, medicines for pain and inflammation, like ibuprofen or naproxen ?phenylbutazone ?tetracycline antibiotics ?This list may not describe all possible interactions. Give your health care provider a list of all the medicines, herbs, non-prescription drugs, or dietary supplements you use. Also tell them if you smoke, drink alcohol, or use illegal drugs. Some items may interact with your medicine. ?What should I watch for while using this medication? ?Visit your healthcare professional for regular checks on your progress. You may need blood work done while you are taking this medicine. Your condition will be monitored carefully while you are receiving this medicine. It is important not to miss any appointments. ?Wear a medical ID bracelet or chain, and carry a card that describes your disease and details   of your medicine and dosage times. ?Notify your doctor or healthcare professional at once if you have cold, blue hands or feet. ?If you are going to need surgery or other procedure, tell your healthcare  professional that you are using this medicine. ?Avoid sports and activities that might cause injury while you are using this medicine. Severe falls or injuries can cause unseen bleeding. Be careful when using sharp tools or knives. Consider using an electric razor. Take special care brushing or flossing your teeth. Report any injuries, bruising, or red spots on the skin to your healthcare professional. ?Using this medicine for a long time may weaken your bones and increase the risk of bone fractures. ?You should make sure that you get enough calcium and vitamin D while you are taking this medicine. Discuss the foods you eat and the vitamins you take with your healthcare professional. ?Wear a medical ID bracelet or chain. Carry a card that describes your disease and details of your medicine and dosage times. ?What side effects may I notice from receiving this medication? ?Side effects that you should report to your doctor or health care professional as soon as possible: ?allergic reactions like skin rash, itching or hives, swelling of the face, lips, or tongue ?bone pain ?fever, chills ?nausea, vomiting ?signs and symptoms of bleeding such as bloody or black, tarry stools; red or dark-brown urine; spitting up blood or brown material that looks like coffee grounds; red spots on the skin; unusual bruising or bleeding from the eye, gums, or nose ?signs and symptoms of a blood clot such as chest pain; shortness of breath; pain, swelling, or warmth in the leg ?signs and symptoms of a stroke such as changes in vision; confusion; trouble speaking or understanding; severe headaches; sudden numbness or weakness of the face, arm or leg; trouble walking; dizziness; loss of coordination ?Side effects that usually do not require medical attention (report to your doctor or health care professional if they continue or are bothersome): ?hair loss ?pain, redness, or irritation at site where injected ?This list may not describe all  possible side effects. Call your doctor for medical advice about side effects. You may report side effects to FDA at 1-800-FDA-1088. ?Where should I keep my medication? ?Keep out of the reach of children. ?Store unopened vials at room temperature between 15 and 30 degrees C (59 and 86 degrees F). Do not freeze. Do not use if solution is discolored or particulate matter is present. Throw away any unused medicine after the expiration date. ?NOTE: This sheet is a summary. It may not cover all possible information. If you have questions about this medicine, talk to your doctor, pharmacist, or health care provider. ?? 2023 Elsevier/Gold Standard (2020-12-16 00:00:00) ? ?

## 2022-04-14 ENCOUNTER — Inpatient Hospital Stay: Payer: BC Managed Care – PPO | Attending: Oncology

## 2022-04-14 VITALS — BP 112/80 | HR 100 | Temp 98.1°F | Resp 20 | Ht 68.0 in | Wt 139.2 lb

## 2022-04-14 DIAGNOSIS — Z85038 Personal history of other malignant neoplasm of large intestine: Secondary | ICD-10-CM | POA: Diagnosis present

## 2022-04-14 DIAGNOSIS — Z95828 Presence of other vascular implants and grafts: Secondary | ICD-10-CM

## 2022-04-14 DIAGNOSIS — C187 Malignant neoplasm of sigmoid colon: Secondary | ICD-10-CM

## 2022-04-14 MED ORDER — SODIUM CHLORIDE (PF) 0.9 % IJ SOLN
Freq: Once | INTRAMUSCULAR | Status: AC
  Filled 2022-04-14: qty 5

## 2022-04-14 MED ORDER — SODIUM CHLORIDE 0.9% FLUSH
10.0000 mL | INTRAVENOUS | Status: DC | PRN
  Administered 2022-04-14: 10 mL via INTRAVENOUS

## 2022-04-14 MED ORDER — HEPARIN SOD (PORK) LOCK FLUSH 100 UNIT/ML IV SOLN
500.0000 [IU] | Freq: Once | INTRAVENOUS | Status: AC | PRN
  Administered 2022-04-14: 500 [IU] via INTRAVENOUS

## 2022-04-14 NOTE — Patient Instructions (Signed)
Heparin injection ?What is this medication? ?HEPARIN (HEP a rin) is an anticoagulant. It is used to treat or prevent clots in the veins, arteries, lungs, or heart. It stops clots from forming or getting bigger. This medicine prevents clotting during open-heart surgery, dialysis, or in patients who are confined to bed. ?This medicine may be used for other purposes; ask your health care provider or pharmacist if you have questions. ?COMMON BRAND NAME(S): Hep-Lock, Hep-Lock U/P, Hepflush-10, Monoject Prefill Advanced Heparin Lock Flush, SASH Normal Saline and Heparin ?What should I tell my care team before I take this medication? ?They need to know if you have any of these conditions: ?bleeding disorders, such as hemophilia or low blood platelets ?bowel disease or diverticulitis ?endocarditis ?high blood pressure ?liver disease ?recent surgery or delivery of a baby ?stomach ulcers ?an unusual or allergic reaction to heparin, benzyl alcohol, sulfites, other medicines, foods, dyes, or preservatives ?pregnant or trying to get pregnant ?breast-feeding ?How should I use this medication? ?This medicine is given by injection or infusion into a vein. It can also be given by injection of small amounts under the skin. It is usually given by a health care professional in a hospital or clinic setting. ?If you get this medicine at home, you will be taught how to prepare and give this medicine. Use exactly as directed. Take your medicine at regular intervals. Do not take it more often than directed. Do not stop taking except on your doctor's advice. Stopping this medicine may increase your risk of a blot clot. Be sure to refill your prescription before you run out of medicine. ?It is important that you put your used needles and syringes in a special sharps container. Do not put them in a trash can. If you do not have a sharps container, call your pharmacist or healthcare provider to get one. ?Talk to your pediatrician regarding the  use of this medicine in children. While this medicine may be prescribed for children for selected conditions, precautions do apply. ?Overdosage: If you think you have taken too much of this medicine contact a poison control center or emergency room at once. ?NOTE: This medicine is only for you. Do not share this medicine with others. ?What if I miss a dose? ?If you miss a dose, take it as soon as you can. If it is almost time for your next dose, take only that dose. Do not take double or extra doses. ?What may interact with this medication? ?Do not take this medicine with any of the following medications: ?aspirin and aspirin-like drugs ?mifepristone ?medicines that treat or prevent blood clots like warfarin, enoxaparin, and dalteparin ?palifermin ?protamine ?This medicine may also interact with the following medications: ?dextran ?digoxin ?hydroxychloroquine ?medicines for treating colds or allergies ?nicotine ?NSAIDs, medicines for pain and inflammation, like ibuprofen or naproxen ?phenylbutazone ?tetracycline antibiotics ?This list may not describe all possible interactions. Give your health care provider a list of all the medicines, herbs, non-prescription drugs, or dietary supplements you use. Also tell them if you smoke, drink alcohol, or use illegal drugs. Some items may interact with your medicine. ?What should I watch for while using this medication? ?Visit your healthcare professional for regular checks on your progress. You may need blood work done while you are taking this medicine. Your condition will be monitored carefully while you are receiving this medicine. It is important not to miss any appointments. ?Wear a medical ID bracelet or chain, and carry a card that describes your disease and details   of your medicine and dosage times. ?Notify your doctor or healthcare professional at once if you have cold, blue hands or feet. ?If you are going to need surgery or other procedure, tell your healthcare  professional that you are using this medicine. ?Avoid sports and activities that might cause injury while you are using this medicine. Severe falls or injuries can cause unseen bleeding. Be careful when using sharp tools or knives. Consider using an electric razor. Take special care brushing or flossing your teeth. Report any injuries, bruising, or red spots on the skin to your healthcare professional. ?Using this medicine for a long time may weaken your bones and increase the risk of bone fractures. ?You should make sure that you get enough calcium and vitamin D while you are taking this medicine. Discuss the foods you eat and the vitamins you take with your healthcare professional. ?Wear a medical ID bracelet or chain. Carry a card that describes your disease and details of your medicine and dosage times. ?What side effects may I notice from receiving this medication? ?Side effects that you should report to your doctor or health care professional as soon as possible: ?allergic reactions like skin rash, itching or hives, swelling of the face, lips, or tongue ?bone pain ?fever, chills ?nausea, vomiting ?signs and symptoms of bleeding such as bloody or black, tarry stools; red or dark-brown urine; spitting up blood or brown material that looks like coffee grounds; red spots on the skin; unusual bruising or bleeding from the eye, gums, or nose ?signs and symptoms of a blood clot such as chest pain; shortness of breath; pain, swelling, or warmth in the leg ?signs and symptoms of a stroke such as changes in vision; confusion; trouble speaking or understanding; severe headaches; sudden numbness or weakness of the face, arm or leg; trouble walking; dizziness; loss of coordination ?Side effects that usually do not require medical attention (report to your doctor or health care professional if they continue or are bothersome): ?hair loss ?pain, redness, or irritation at site where injected ?This list may not describe all  possible side effects. Call your doctor for medical advice about side effects. You may report side effects to FDA at 1-800-FDA-1088. ?Where should I keep my medication? ?Keep out of the reach of children. ?Store unopened vials at room temperature between 15 and 30 degrees C (59 and 86 degrees F). Do not freeze. Do not use if solution is discolored or particulate matter is present. Throw away any unused medicine after the expiration date. ?NOTE: This sheet is a summary. It may not cover all possible information. If you have questions about this medicine, talk to your doctor, pharmacist, or health care provider. ?? 2023 Elsevier/Gold Standard (2020-12-16 00:00:00) ? ?

## 2022-04-14 NOTE — Progress Notes (Signed)
Accessed Medtronic Pump LUQ abdomen using sterile technique without difficulty. Aspirated 4 ml (Ipad noted 3.2 ml). Flushed with 20 ml NS with 25,000 units heparin aspirating every 3 ml. Needle removed and bandaid applied. Will return 05/17/2022 for next refill. Tolerated procedure well.

## 2022-05-12 ENCOUNTER — Inpatient Hospital Stay: Payer: BC Managed Care – PPO | Attending: Oncology

## 2022-05-12 VITALS — BP 116/83 | HR 76 | Temp 98.0°F | Resp 18 | Ht 68.0 in | Wt 141.2 lb

## 2022-05-12 DIAGNOSIS — Z95828 Presence of other vascular implants and grafts: Secondary | ICD-10-CM

## 2022-05-12 DIAGNOSIS — Z85038 Personal history of other malignant neoplasm of large intestine: Secondary | ICD-10-CM | POA: Insufficient documentation

## 2022-05-12 DIAGNOSIS — C187 Malignant neoplasm of sigmoid colon: Secondary | ICD-10-CM

## 2022-05-12 MED ORDER — SODIUM CHLORIDE (PF) 0.9 % IJ SOLN
Freq: Once | INTRAMUSCULAR | Status: AC
  Filled 2022-05-12: qty 5

## 2022-06-09 ENCOUNTER — Inpatient Hospital Stay: Payer: BC Managed Care – PPO | Attending: Oncology

## 2022-06-09 VITALS — BP 120/89 | HR 82 | Temp 97.6°F | Resp 20 | Ht 68.0 in | Wt 139.0 lb

## 2022-06-09 DIAGNOSIS — Z95828 Presence of other vascular implants and grafts: Secondary | ICD-10-CM

## 2022-06-09 DIAGNOSIS — C187 Malignant neoplasm of sigmoid colon: Secondary | ICD-10-CM

## 2022-06-09 DIAGNOSIS — Z85038 Personal history of other malignant neoplasm of large intestine: Secondary | ICD-10-CM | POA: Diagnosis present

## 2022-06-09 MED ORDER — SODIUM CHLORIDE (PF) 0.9 % IJ SOLN
Freq: Once | INTRAMUSCULAR | Status: AC
  Filled 2022-06-09: qty 5

## 2022-06-09 MED ORDER — SODIUM CHLORIDE 0.9% FLUSH
10.0000 mL | INTRAVENOUS | Status: DC | PRN
  Administered 2022-06-09: 10 mL via INTRAVENOUS

## 2022-06-09 MED ORDER — HEPARIN SOD (PORK) LOCK FLUSH 100 UNIT/ML IV SOLN
500.0000 [IU] | Freq: Once | INTRAVENOUS | Status: AC | PRN
  Administered 2022-06-09: 500 [IU] via INTRAVENOUS

## 2022-06-09 NOTE — Progress Notes (Signed)
Accessed Medtronic Pump LUQ abdomen using sterile technique without difficulty. Aspirated 3 ml (Ipad noted 3.2 ml). Flushed with 20 ml NS with 25,000 units heparin aspirating every 3 ml. Needle removed and bandaid applied. Will return 07/07/2022 for next refill. Tolerated procedure well.

## 2022-06-09 NOTE — Patient Instructions (Signed)
Heparin injection ?What is this medication? ?HEPARIN (HEP a rin) is an anticoagulant. It is used to treat or prevent clots in the veins, arteries, lungs, or heart. It stops clots from forming or getting bigger. This medicine prevents clotting during open-heart surgery, dialysis, or in patients who are confined to bed. ?This medicine may be used for other purposes; ask your health care provider or pharmacist if you have questions. ?COMMON BRAND NAME(S): Hep-Lock, Hep-Lock U/P, Hepflush-10, Monoject Prefill Advanced Heparin Lock Flush, SASH Normal Saline and Heparin ?What should I tell my care team before I take this medication? ?They need to know if you have any of these conditions: ?bleeding disorders, such as hemophilia or low blood platelets ?bowel disease or diverticulitis ?endocarditis ?high blood pressure ?liver disease ?recent surgery or delivery of a baby ?stomach ulcers ?an unusual or allergic reaction to heparin, benzyl alcohol, sulfites, other medicines, foods, dyes, or preservatives ?pregnant or trying to get pregnant ?breast-feeding ?How should I use this medication? ?This medicine is given by injection or infusion into a vein. It can also be given by injection of small amounts under the skin. It is usually given by a health care professional in a hospital or clinic setting. ?If you get this medicine at home, you will be taught how to prepare and give this medicine. Use exactly as directed. Take your medicine at regular intervals. Do not take it more often than directed. Do not stop taking except on your doctor's advice. Stopping this medicine may increase your risk of a blot clot. Be sure to refill your prescription before you run out of medicine. ?It is important that you put your used needles and syringes in a special sharps container. Do not put them in a trash can. If you do not have a sharps container, call your pharmacist or healthcare provider to get one. ?Talk to your pediatrician regarding the  use of this medicine in children. While this medicine may be prescribed for children for selected conditions, precautions do apply. ?Overdosage: If you think you have taken too much of this medicine contact a poison control center or emergency room at once. ?NOTE: This medicine is only for you. Do not share this medicine with others. ?What if I miss a dose? ?If you miss a dose, take it as soon as you can. If it is almost time for your next dose, take only that dose. Do not take double or extra doses. ?What may interact with this medication? ?Do not take this medicine with any of the following medications: ?aspirin and aspirin-like drugs ?mifepristone ?medicines that treat or prevent blood clots like warfarin, enoxaparin, and dalteparin ?palifermin ?protamine ?This medicine may also interact with the following medications: ?dextran ?digoxin ?hydroxychloroquine ?medicines for treating colds or allergies ?nicotine ?NSAIDs, medicines for pain and inflammation, like ibuprofen or naproxen ?phenylbutazone ?tetracycline antibiotics ?This list may not describe all possible interactions. Give your health care provider a list of all the medicines, herbs, non-prescription drugs, or dietary supplements you use. Also tell them if you smoke, drink alcohol, or use illegal drugs. Some items may interact with your medicine. ?What should I watch for while using this medication? ?Visit your healthcare professional for regular checks on your progress. You may need blood work done while you are taking this medicine. Your condition will be monitored carefully while you are receiving this medicine. It is important not to miss any appointments. ?Wear a medical ID bracelet or chain, and carry a card that describes your disease and details   of your medicine and dosage times. ?Notify your doctor or healthcare professional at once if you have cold, blue hands or feet. ?If you are going to need surgery or other procedure, tell your healthcare  professional that you are using this medicine. ?Avoid sports and activities that might cause injury while you are using this medicine. Severe falls or injuries can cause unseen bleeding. Be careful when using sharp tools or knives. Consider using an electric razor. Take special care brushing or flossing your teeth. Report any injuries, bruising, or red spots on the skin to your healthcare professional. ?Using this medicine for a long time may weaken your bones and increase the risk of bone fractures. ?You should make sure that you get enough calcium and vitamin D while you are taking this medicine. Discuss the foods you eat and the vitamins you take with your healthcare professional. ?Wear a medical ID bracelet or chain. Carry a card that describes your disease and details of your medicine and dosage times. ?What side effects may I notice from receiving this medication? ?Side effects that you should report to your doctor or health care professional as soon as possible: ?allergic reactions like skin rash, itching or hives, swelling of the face, lips, or tongue ?bone pain ?fever, chills ?nausea, vomiting ?signs and symptoms of bleeding such as bloody or black, tarry stools; red or dark-brown urine; spitting up blood or brown material that looks like coffee grounds; red spots on the skin; unusual bruising or bleeding from the eye, gums, or nose ?signs and symptoms of a blood clot such as chest pain; shortness of breath; pain, swelling, or warmth in the leg ?signs and symptoms of a stroke such as changes in vision; confusion; trouble speaking or understanding; severe headaches; sudden numbness or weakness of the face, arm or leg; trouble walking; dizziness; loss of coordination ?Side effects that usually do not require medical attention (report to your doctor or health care professional if they continue or are bothersome): ?hair loss ?pain, redness, or irritation at site where injected ?This list may not describe all  possible side effects. Call your doctor for medical advice about side effects. You may report side effects to FDA at 1-800-FDA-1088. ?Where should I keep my medication? ?Keep out of the reach of children. ?Store unopened vials at room temperature between 15 and 30 degrees C (59 and 86 degrees F). Do not freeze. Do not use if solution is discolored or particulate matter is present. Throw away any unused medicine after the expiration date. ?NOTE: This sheet is a summary. It may not cover all possible information. If you have questions about this medicine, talk to your doctor, pharmacist, or health care provider. ?? 2023 Elsevier/Gold Standard (2020-12-16 00:00:00) ? ?

## 2022-07-03 ENCOUNTER — Telehealth: Payer: Self-pay | Admitting: Oncology

## 2022-07-03 NOTE — Telephone Encounter (Signed)
Attempted to contact patient in regards to rescheduling appointment on 8/18 due to Ms. [place] being out of office, Voicemail full and unable to leave message

## 2022-07-06 ENCOUNTER — Inpatient Hospital Stay: Payer: BC Managed Care – PPO | Attending: Oncology

## 2022-07-06 VITALS — BP 112/87 | HR 71 | Temp 98.0°F | Resp 20 | Ht 68.0 in | Wt 139.0 lb

## 2022-07-06 DIAGNOSIS — Z85038 Personal history of other malignant neoplasm of large intestine: Secondary | ICD-10-CM | POA: Insufficient documentation

## 2022-07-06 DIAGNOSIS — Z95828 Presence of other vascular implants and grafts: Secondary | ICD-10-CM

## 2022-07-06 DIAGNOSIS — C187 Malignant neoplasm of sigmoid colon: Secondary | ICD-10-CM

## 2022-07-06 MED ORDER — SODIUM CHLORIDE 0.9% FLUSH: 10.0000 mL | INTRAVENOUS | Status: DC | PRN

## 2022-07-06 MED ORDER — SODIUM CHLORIDE (PF) 0.9 % IJ SOLN
Freq: Once | INTRAMUSCULAR | Status: AC
  Filled 2022-07-06: qty 5

## 2022-07-06 MED ORDER — HEPARIN SOD (PORK) LOCK FLUSH 100 UNIT/ML IV SOLN: 500.0000 [IU] | Freq: Once | INTRAVENOUS | Status: DC | PRN

## 2022-07-06 NOTE — Patient Instructions (Signed)
Heparin injection What is this medication? HEPARIN (HEP a rin) is an anticoagulant. It is used to treat or prevent clots in the veins, arteries, lungs, or heart. It stops clots from forming or getting bigger. This medicine prevents clotting during open-heart surgery, dialysis, or in patients who are confined to bed. This medicine may be used for other purposes; ask your health care provider or pharmacist if you have questions. COMMON BRAND NAME(S): Hep-Lock, Hep-Lock U/P, Hepflush-10, Monoject Prefill Advanced Heparin Lock Flush, SASH Normal Saline and Heparin What should I tell my care team before I take this medication? They need to know if you have any of these conditions: bleeding disorders, such as hemophilia or low blood platelets bowel disease or diverticulitis endocarditis high blood pressure liver disease recent surgery or delivery of a baby stomach ulcers an unusual or allergic reaction to heparin, benzyl alcohol, sulfites, other medicines, foods, dyes, or preservatives pregnant or trying to get pregnant breast-feeding How should I use this medication? This medicine is given by injection or infusion into a vein. It can also be given by injection of small amounts under the skin. It is usually given by a health care professional in a hospital or clinic setting. If you get this medicine at home, you will be taught how to prepare and give this medicine. Use exactly as directed. Take your medicine at regular intervals. Do not take it more often than directed. Do not stop taking except on your doctor's advice. Stopping this medicine may increase your risk of a blot clot. Be sure to refill your prescription before you run out of medicine. It is important that you put your used needles and syringes in a special sharps container. Do not put them in a trash can. If you do not have a sharps container, call your pharmacist or healthcare provider to get one. Talk to your pediatrician regarding the  use of this medicine in children. While this medicine may be prescribed for children for selected conditions, precautions do apply. Overdosage: If you think you have taken too much of this medicine contact a poison control center or emergency room at once. NOTE: This medicine is only for you. Do not share this medicine with others. What if I miss a dose? If you miss a dose, take it as soon as you can. If it is almost time for your next dose, take only that dose. Do not take double or extra doses. What may interact with this medication? Do not take this medicine with any of the following medications: aspirin and aspirin-like drugs mifepristone medicines that treat or prevent blood clots like warfarin, enoxaparin, and dalteparin palifermin protamine This medicine may also interact with the following medications: dextran digoxin hydroxychloroquine medicines for treating colds or allergies nicotine NSAIDs, medicines for pain and inflammation, like ibuprofen or naproxen phenylbutazone tetracycline antibiotics This list may not describe all possible interactions. Give your health care provider a list of all the medicines, herbs, non-prescription drugs, or dietary supplements you use. Also tell them if you smoke, drink alcohol, or use illegal drugs. Some items may interact with your medicine. What should I watch for while using this medication? Visit your healthcare professional for regular checks on your progress. You may need blood work done while you are taking this medicine. Your condition will be monitored carefully while you are receiving this medicine. It is important not to miss any appointments. Wear a medical ID bracelet or chain, and carry a card that describes your disease and details   of your medicine and dosage times. Notify your doctor or healthcare professional at once if you have cold, blue hands or feet. If you are going to need surgery or other procedure, tell your healthcare  professional that you are using this medicine. Avoid sports and activities that might cause injury while you are using this medicine. Severe falls or injuries can cause unseen bleeding. Be careful when using sharp tools or knives. Consider using an electric razor. Take special care brushing or flossing your teeth. Report any injuries, bruising, or red spots on the skin to your healthcare professional. Using this medicine for a long time may weaken your bones and increase the risk of bone fractures. You should make sure that you get enough calcium and vitamin D while you are taking this medicine. Discuss the foods you eat and the vitamins you take with your healthcare professional. Wear a medical ID bracelet or chain. Carry a card that describes your disease and details of your medicine and dosage times. What side effects may I notice from receiving this medication? Side effects that you should report to your doctor or health care professional as soon as possible: allergic reactions like skin rash, itching or hives, swelling of the face, lips, or tongue bone pain fever, chills nausea, vomiting signs and symptoms of bleeding such as bloody or black, tarry stools; red or dark-brown urine; spitting up blood or brown material that looks like coffee grounds; red spots on the skin; unusual bruising or bleeding from the eye, gums, or nose signs and symptoms of a blood clot such as chest pain; shortness of breath; pain, swelling, or warmth in the leg signs and symptoms of a stroke such as changes in vision; confusion; trouble speaking or understanding; severe headaches; sudden numbness or weakness of the face, arm or leg; trouble walking; dizziness; loss of coordination Side effects that usually do not require medical attention (report to your doctor or health care professional if they continue or are bothersome): hair loss pain, redness, or irritation at site where injected This list may not describe all  possible side effects. Call your doctor for medical advice about side effects. You may report side effects to FDA at 1-800-FDA-1088. Where should I keep my medication? Keep out of the reach of children. Store unopened vials at room temperature between 15 and 30 degrees C (59 and 86 degrees F). Do not freeze. Do not use if solution is discolored or particulate matter is present. Throw away any unused medicine after the expiration date. NOTE: This sheet is a summary. It may not cover all possible information. If you have questions about this medicine, talk to your doctor, pharmacist, or health care provider.  2023 Elsevier/Gold Standard (2005-08-14 00:00:00)  

## 2022-07-06 NOTE — Progress Notes (Signed)
Accessed Medtronic Pump LUQ abdomen using sterile technique without difficulty. Aspirated 4.1 ml (Ipad noted 3.2 ml). Flushed with 20 ml NS with 25,000 units heparin aspirating every 3 ml. Needle removed and bandaid applied. Will return 08/03/2022 for next refill. Tolerated procedure well.

## 2022-07-07 ENCOUNTER — Inpatient Hospital Stay: Payer: BC Managed Care – PPO

## 2022-07-25 ENCOUNTER — Inpatient Hospital Stay (HOSPITAL_BASED_OUTPATIENT_CLINIC_OR_DEPARTMENT_OTHER): Payer: BC Managed Care – PPO | Admitting: Oncology

## 2022-07-25 ENCOUNTER — Encounter (HOSPITAL_BASED_OUTPATIENT_CLINIC_OR_DEPARTMENT_OTHER): Payer: Self-pay

## 2022-07-25 ENCOUNTER — Ambulatory Visit (HOSPITAL_BASED_OUTPATIENT_CLINIC_OR_DEPARTMENT_OTHER)
Admission: RE | Admit: 2022-07-25 | Discharge: 2022-07-25 | Disposition: A | Discharge status: Home / Self Care | Payer: BC Managed Care – PPO | Source: Ambulatory Visit | Attending: Oncology | Admitting: Oncology

## 2022-07-25 ENCOUNTER — Inpatient Hospital Stay: Payer: BC Managed Care – PPO

## 2022-07-25 ENCOUNTER — Inpatient Hospital Stay: Payer: BC Managed Care – PPO | Attending: Oncology

## 2022-07-25 VITALS — BP 110/90 | HR 88 | Temp 98.2°F | Resp 20 | Ht 68.0 in | Wt 140.4 lb

## 2022-07-25 DIAGNOSIS — D6959 Other secondary thrombocytopenia: Secondary | ICD-10-CM | POA: Diagnosis not present

## 2022-07-25 DIAGNOSIS — Z85038 Personal history of other malignant neoplasm of large intestine: Secondary | ICD-10-CM | POA: Diagnosis present

## 2022-07-25 DIAGNOSIS — C187 Malignant neoplasm of sigmoid colon: Secondary | ICD-10-CM | POA: Insufficient documentation

## 2022-07-25 DIAGNOSIS — Z803 Family history of malignant neoplasm of breast: Secondary | ICD-10-CM | POA: Diagnosis not present

## 2022-07-25 DIAGNOSIS — Z800 Family history of malignant neoplasm of digestive organs: Secondary | ICD-10-CM | POA: Insufficient documentation

## 2022-07-25 LAB — BASIC METABOLIC PANEL - CANCER CENTER ONLY
Anion gap: 9 (ref 5–15)
BUN: 13 mg/dL (ref 6–20)
CO2: 26 mmol/L (ref 22–32)
Calcium: 9.5 mg/dL (ref 8.9–10.3)
Chloride: 105 mmol/L (ref 98–111)
Creatinine: 0.79 mg/dL (ref 0.44–1.00)
GFR, Estimated: >60 mL/min (ref >60)
Glucose, Bld: 94 mg/dL (ref 70–99)
Potassium: 4.1 mmol/L (ref 3.5–5.1)
Sodium: 140 mmol/L (ref 135–145)

## 2022-07-25 LAB — CEA (ACCESS): CEA (CHCC): 1.73 ng/mL (ref 0.00–5.00)

## 2022-07-25 MED ORDER — IOHEXOL 300 MG/ML  SOLN
100.0000 mL | Freq: Once | INTRAMUSCULAR | Status: AC | PRN
  Administered 2022-07-25: 75 mL via INTRAVENOUS

## 2022-07-25 MED ORDER — HEPARIN SOD (PORK) LOCK FLUSH 100 UNIT/ML IV SOLN: 500.0000 [IU] | Freq: Once | INTRAVENOUS | Status: DC

## 2022-07-25 NOTE — Progress Notes (Signed)
[facility] OFFICE PROGRESS NOTE   Diagnosis: Colon cancer  INTERVAL HISTORY:   Patricia Hudson returns as scheduled.  She feels well.  Her appetite and energy level.  She is working.  She plans to have the right neck cyst excised by ENT. Hepatic infusion pump is flush monthly.  The Port-A-Cath is flushed every 8 weeks.  Objective:  Vital signs in last 24 hours:  Blood pressure (!) 110/90, pulse 88, temperature 98.2 F (36.8 C), temperature source Oral, resp. rate 20, height '5\' 8"'  (1.727 m), weight 140 lb 6.4 oz (63.7 kg), last menstrual period 04/20/2013, SpO2 100 %.    HEENT: Soft mobile 1-1.5 cm round cutaneous lesion at the right mid neck Lymphatics: No cervical, supraclavicular, axillary, or inguinal nodes Resp: Lungs clear bilaterally Cardio: Regular rate and rhythm GI: No hepatosplenomegaly, left abdomen infusion pump, no mass, nontender Vascular: No leg edema   Portacath/PICC-without erythema  Lab Results:  Lab Results  Component Value Date   WBC 4.6 02/21/2021   HGB 13.3 02/21/2021   HCT 39.5 02/21/2021   MCV 91.2 02/21/2021   PLT 181 02/21/2021   NEUTROABS 3.0 02/21/2021    CMP  Lab Results  Component Value Date   NA 140 07/25/2022   K 4.1 07/25/2022   CL 105 07/25/2022   CO2 26 07/25/2022   GLUCOSE 94 07/25/2022   BUN 13 07/25/2022   CREATININE 0.79 07/25/2022   CALCIUM 9.5 07/25/2022   PROT 7.2 08/18/2021   ALBUMIN 4.7 08/18/2021   AST 19 08/18/2021   ALT 12 08/18/2021   ALKPHOS 99 08/18/2021   BILITOT 0.5 08/18/2021   GFRNONAA >60 07/25/2022   GFRAA >60 01/27/2020    Lab Results  Component Value Date   CEA1 <1.00 08/18/2021   CEA 1.73 07/25/2022    Lab Results  Component Value Date   INR 1.27 07/13/2016   LABPROT 16.0 (H) 07/13/2016    Imaging:  CT CHEST ABDOMEN PELVIS W CONTRAST  Result Date: 07/25/2022 CLINICAL DATA:  A 56 year old feet female presents for follow-up/staging of colorectal neoplasm and history of  partial colectomy. * Tracking Code: BO * EXAM: CT CHEST, ABDOMEN, AND PELVIS WITH CONTRAST TECHNIQUE: Multidetector CT imaging of the chest, abdomen and pelvis was performed following the standard protocol during bolus administration of intravenous contrast. RADIATION DOSE REDUCTION: This exam was performed according to the departmental dose-optimization program which includes automated exposure control, adjustment of the mA and/or kV according to patient size and/or use of iterative reconstruction technique. CONTRAST:  5m OMNIPAQUE IOHEXOL 300 MG/ML  SOLN COMPARISON:  August 18, 2021 as of remote comparison, imaging most recently from February 21, 2022. FINDINGS: CT CHEST FINDINGS Cardiovascular: Mediastinum/Nodes: No thoracic inlet, axillary, mediastinal or hilar adenopathy. Esophagus grossly normal. Lungs/Pleura: Lungs are clear and airways are patent. No sign of pleural effusion or pleural thickening. Musculoskeletal: See below for full musculoskeletal details. CT ABDOMEN PELVIS FINDINGS Hepatobiliary: Post partial hepatic resection of LEFT hepatic lobe in hepatic subsegment III. Stable postoperative changes and low attenuation. Stable hepatic cysts. Stable positioning of hepatic arterial infusion pump catheter which terminates in the area of the gastroduodenal artery. The portal vein is patent. Hepatic veins are patent. Pancreas: Normal, without mass, inflammation or ductal dilatation. Spleen: Normal. Adrenals/Urinary Tract: Adrenal glands are unremarkable. Symmetric renal enhancement. No sign of hydronephrosis. No suspicious renal lesion or perinephric stranding. Urinary bladder is grossly unremarkable. 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 Stomach/Bowel:  No stranding adjacent to the stomach. No signs of small bowel dilation or inflammation. Appendix is normal. Signs of partial colectomy as before. No focal thickening in the area of the anastomosis. Vascular/Lymphatic:  Calcified atheromatous plaque of the abdominal aorta is mild-to-moderate. No aortic dilation. There is no gastrohepatic or hepatoduodenal ligament lymphadenopathy. No retroperitoneal or mesenteric lymphadenopathy. No pelvic sidewall lymphadenopathy. Reproductive: Unremarkable by CT. Other: Infusion pump over the LEFT lower quadrant without adjacent stranding. Catheter terminating in the area of the gastroduodenal artery as on multiple prior studies. Musculoskeletal: No acute musculoskeletal findings. Spinal degenerative changes. IMPRESSION: 1. Signs of partial colectomy, partial hepatectomy and insertion of hepatic arterial infusion pump catheter as before. No evidence of recurrent or metastatic disease. 2. Aortic atherosclerosis. Aortic Atherosclerosis (ICD10-I70.0). Electronically Signed   By: Zetta Bills M.D.   On: 07/25/2022 10:57    Medications: I have reviewed the patient's current medications.   Assessment/Plan: Adenocarcinoma of the sigmoid colon, status post a colonoscopic biopsy 07/07/2016 Mismatch repair protein expression-normal Sigmoid mass noted at 20 cm from the anal verge Staging CTs of the chest, abdomen, and pelvis negative for evidence of metastatic disease other than an indeterminate 10 mm right liver lesion MRI of the abdomen 07/07/2016 revealed multiple bilateral hepatic cyst. 7 mm inferior subcapsular right liver lesion suspicious for a metastasis PET scan 07/21/2016-hypermetabolic sigmoid colon mass, mildly hypermetabolic segment 6 liver lesion Laparoscopic assisted rectosigmoid colectomy and segment 8 liver resection 07/27/2016, pathology confirmed a T3, N2b, M1 tumor, 11/16 lymph nodes positive for metastatic carcinoma Foundation 1 testing found the tumor to be microsatellite stable, tumor mutation burden low, no BRAF or RAS mutation No loss of mismatch repair protein expression, MSI-stable Cycle 1 CAPOX 08/22/2016 Cycle 2 CAPOX 09/12/2016 Cycle 3 CAPOX 10/03/2016  (Xeloda dose reduced to 3 tablets every morning and 2 tablets every evening beginning with cycle 3) Cycle 4 CAPOX 10/31/2016 (Xeloda re-escalated to original dose) Cycle 5 CAPOX 11/21/2016 Cycle 6 CAPOX 12/12/2016 Cycle 7 CAPOX 01/09/2017 (oxaliplatin held) Cycle 8 CAPOX 01/30/2017 (oxaliplatin dose reduced) CT scans 02/27/2017-negative for recurrent colon cancer CTs 08/14/2017-negative for recurrent cancer CTs 02/12/2018-negative for recurrent cancer CTs 07/29/2018-negative for recurrent cancer  CTs 01/27/2019-negative for recurrent cancer, new 8 mm subpleural left lower lobe nodule CT chest 04/29/2019- resolution of left lower lobe nodule CT 07/29/2019- negative for recurrent cancer CTs 01/27/2020-stable 4 mm right apical lung nodule.  New 1.6 x 1.6 cm lesion identified in the lateral segment left liver.  Stable 9 mm subcapsular lesion interpolar left kidney. MRI liver 3/10/ 2021-15 mm enhancing lesion in segment 3, new from September 2020, subcentimeter hemorrhagic cyst in the left kidney Robotic assisted partial hepatectomy (segment 3), cholecystectomy, portal lymphadenectomy, and insertion of hepatic infusion pump 02/12/2020, 0/3 nodes, 1.9 cm adenocarcinoma in segment 3, negative margins 03/11/2020-FUDR and infusional 5-FU #1 04/08/2020-FUDR #2 05/06/2020-FUDR#3 06/03/2020-FUDR#4 canceled secondary to elevation of the alkaline phosphatase 07/15/2020-FUDR#4, 50% dose reduction 09/09/2000-CTs chest/abdomen/pelvis-negative 12/02/2020- CTs negative for recurrent disease 02/21/2021-CTs negative for recurrent disease, stable surgical changes in the liver CTs 08/18/2021-no evidence of recurrent disease, stable postoperative findings of partial hepatectomy CTs 02/21/2022-no evidence of recurrent disease CTs 07/25/2022-no evidence of recurrent disease   2.   History of rectal bleeding secondary to #1   3.   Multiple polyps on the colonoscopy 07/07/2016 Admission 07/13/2016 with  acute GI bleeding secondary to an  ulcer at the cecum Colonoscopy 10/08/2017-small polyps removed from the cecum-colonic mucosa with lymphoid aggregates-no dysplasia or malignancy   4.  Family history of breast and pancreas cancer   5.  Delayed nausea following cycle 1 CAPOX-emend added with cycle 2   6. Diarrhea following cycle 2 and cycle 6 CAPOX   7.  Neutropenia/thrombocytopenia secondary to chemotherapy   8.  History of mild oxaliplatin neuropathy- resolved       Disposition: Patricia Hudson remains in clinical remission from colon cancer.  The restaging CTs showed no evidence of recurrent disease.  She is now 2-1/2 years out from the hepatic resection/infusion pump.  The CEA is slightly higher in the normal range today.  We will check the CEA when she returns for a Port-A-Cath flush in 8 weeks.  She will be scheduled for an office visit and surveillance imaging in 6 months.  The hepatic infusion pump will be flushed monthly.  The Port-A-Cath will be flushed every 8 weeks.  Betsy Coder, MD  07/25/2022  11:27 AM

## 2022-08-03 ENCOUNTER — Inpatient Hospital Stay: Payer: BC Managed Care – PPO

## 2022-08-03 VITALS — BP 117/89 | HR 71 | Temp 98.2°F | Resp 16

## 2022-08-03 DIAGNOSIS — C187 Malignant neoplasm of sigmoid colon: Secondary | ICD-10-CM

## 2022-08-03 DIAGNOSIS — Z95828 Presence of other vascular implants and grafts: Secondary | ICD-10-CM

## 2022-08-03 DIAGNOSIS — Z85038 Personal history of other malignant neoplasm of large intestine: Secondary | ICD-10-CM | POA: Diagnosis not present

## 2022-08-03 MED ORDER — SODIUM CHLORIDE (PF) 0.9 % IJ SOLN
Freq: Once | INTRAMUSCULAR | Status: AC
  Filled 2022-08-03: qty 5

## 2022-08-03 NOTE — Progress Notes (Signed)
    Accessed Medtronic Pump LUQ abdomen using sterile technique without difficulty. Aspirated 3.2 ml (Ipad noted 3.2 ml). Flushed with 20 ml NS with 25,000 units heparin aspirating every 3 ml. Needle removed and bandaid applied. Will return 08/31/2022 for next refill. Tolerated procedure well.

## 2022-08-04 ENCOUNTER — Ambulatory Visit: Payer: BC Managed Care – PPO

## 2022-08-31 ENCOUNTER — Inpatient Hospital Stay: Payer: BC Managed Care – PPO

## 2022-08-31 ENCOUNTER — Inpatient Hospital Stay: Payer: BC Managed Care – PPO | Attending: Oncology

## 2022-08-31 DIAGNOSIS — C187 Malignant neoplasm of sigmoid colon: Secondary | ICD-10-CM

## 2022-08-31 DIAGNOSIS — Z85038 Personal history of other malignant neoplasm of large intestine: Secondary | ICD-10-CM | POA: Diagnosis present

## 2022-08-31 DIAGNOSIS — Z95828 Presence of other vascular implants and grafts: Secondary | ICD-10-CM

## 2022-08-31 LAB — CEA (ACCESS): CEA (CHCC): <1 ng/mL (ref 0.00–5.00)

## 2022-08-31 MED ORDER — SODIUM CHLORIDE 0.9% FLUSH
10.0000 mL | INTRAVENOUS | Status: DC | PRN
  Administered 2022-08-31: 10 mL via INTRAVENOUS

## 2022-08-31 MED ORDER — SODIUM CHLORIDE (PF) 0.9 % IJ SOLN
Freq: Once | INTRAMUSCULAR | Status: AC
  Filled 2022-08-31: qty 5

## 2022-08-31 MED ORDER — HEPARIN SOD (PORK) LOCK FLUSH 100 UNIT/ML IV SOLN
500.0000 [IU] | Freq: Once | INTRAVENOUS | Status: AC | PRN
  Administered 2022-08-31: 500 [IU] via INTRAVENOUS

## 2022-08-31 NOTE — Patient Instructions (Signed)
Heparin injection What is this medication? HEPARIN (HEP a rin) is an anticoagulant. It is used to treat or prevent clots in the veins, arteries, lungs, or heart. It stops clots from forming or getting bigger. This medicine prevents clotting during open-heart surgery, dialysis, or in patients who are confined to bed. This medicine may be used for other purposes; ask your health care provider or pharmacist if you have questions. COMMON BRAND NAME(S): Hep-Lock, Hep-Lock U/P, Hepflush-10, Monoject Prefill Advanced Heparin Lock Flush, SASH Normal Saline and Heparin What should I tell my care team before I take this medication? They need to know if you have any of these conditions: bleeding disorders, such as hemophilia or low blood platelets bowel disease or diverticulitis endocarditis high blood pressure liver disease recent surgery or delivery of a baby stomach ulcers an unusual or allergic reaction to heparin, benzyl alcohol, sulfites, other medicines, foods, dyes, or preservatives pregnant or trying to get pregnant breast-feeding How should I use this medication? This medicine is given by injection or infusion into a vein. It can also be given by injection of small amounts under the skin. It is usually given by a health care professional in a hospital or clinic setting. If you get this medicine at home, you will be taught how to prepare and give this medicine. Use exactly as directed. Take your medicine at regular intervals. Do not take it more often than directed. Do not stop taking except on your doctor's advice. Stopping this medicine may increase your risk of a blot clot. Be sure to refill your prescription before you run out of medicine. It is important that you put your used needles and syringes in a special sharps container. Do not put them in a trash can. If you do not have a sharps container, call your pharmacist or healthcare provider to get one. Talk to your pediatrician regarding the  use of this medicine in children. While this medicine may be prescribed for children for selected conditions, precautions do apply. Overdosage: If you think you have taken too much of this medicine contact a poison control center or emergency room at once. NOTE: This medicine is only for you. Do not share this medicine with others. What if I miss a dose? If you miss a dose, take it as soon as you can. If it is almost time for your next dose, take only that dose. Do not take double or extra doses. What may interact with this medication? Do not take this medicine with any of the following medications: aspirin and aspirin-like drugs mifepristone medicines that treat or prevent blood clots like warfarin, enoxaparin, and dalteparin palifermin protamine This medicine may also interact with the following medications: dextran digoxin hydroxychloroquine medicines for treating colds or allergies nicotine NSAIDs, medicines for pain and inflammation, like ibuprofen or naproxen phenylbutazone tetracycline antibiotics This list may not describe all possible interactions. Give your health care provider a list of all the medicines, herbs, non-prescription drugs, or dietary supplements you use. Also tell them if you smoke, drink alcohol, or use illegal drugs. Some items may interact with your medicine. What should I watch for while using this medication? Visit your healthcare professional for regular checks on your progress. You may need blood work done while you are taking this medicine. Your condition will be monitored carefully while you are receiving this medicine. It is important not to miss any appointments. Wear a medical ID bracelet or chain, and carry a card that describes your disease and details   of your medicine and dosage times. Notify your doctor or healthcare professional at once if you have cold, blue hands or feet. If you are going to need surgery or other procedure, tell your healthcare  professional that you are using this medicine. Avoid sports and activities that might cause injury while you are using this medicine. Severe falls or injuries can cause unseen bleeding. Be careful when using sharp tools or knives. Consider using an electric razor. Take special care brushing or flossing your teeth. Report any injuries, bruising, or red spots on the skin to your healthcare professional. Using this medicine for a long time may weaken your bones and increase the risk of bone fractures. You should make sure that you get enough calcium and vitamin D while you are taking this medicine. Discuss the foods you eat and the vitamins you take with your healthcare professional. Wear a medical ID bracelet or chain. Carry a card that describes your disease and details of your medicine and dosage times. What side effects may I notice from receiving this medication? Side effects that you should report to your doctor or health care professional as soon as possible: allergic reactions like skin rash, itching or hives, swelling of the face, lips, or tongue bone pain fever, chills nausea, vomiting signs and symptoms of bleeding such as bloody or black, tarry stools; red or dark-brown urine; spitting up blood or brown material that looks like coffee grounds; red spots on the skin; unusual bruising or bleeding from the eye, gums, or nose signs and symptoms of a blood clot such as chest pain; shortness of breath; pain, swelling, or warmth in the leg signs and symptoms of a stroke such as changes in vision; confusion; trouble speaking or understanding; severe headaches; sudden numbness or weakness of the face, arm or leg; trouble walking; dizziness; loss of coordination Side effects that usually do not require medical attention (report to your doctor or health care professional if they continue or are bothersome): hair loss pain, redness, or irritation at site where injected This list may not describe all  possible side effects. Call your doctor for medical advice about side effects. You may report side effects to FDA at 1-800-FDA-1088. Where should I keep my medication? Keep out of the reach of children. Store unopened vials at room temperature between 15 and 30 degrees C (59 and 86 degrees F). Do not freeze. Do not use if solution is discolored or particulate matter is present. Throw away any unused medicine after the expiration date. NOTE: This sheet is a summary. It may not cover all possible information. If you have questions about this medicine, talk to your doctor, pharmacist, or health care provider.  2023 Elsevier/Gold Standard (2005-08-14 00:00:00)  

## 2022-08-31 NOTE — Progress Notes (Signed)
Heparin 5000 units/mL, HCO:[id number], Exp: 12/2022  Acquanetta Belling, RPH, BCPS, BCOP 08/31/2022 10:15 AM

## 2022-08-31 NOTE — Progress Notes (Signed)
Accessed Medtronic Pump LUQ abdomen using sterile technique without difficulty. Aspirated 4.0 ml (Ipad noted 3.2 ml). Flushed with 20 ml NS with 25,000 units heparin aspirating every 3 ml. Needle removed and bandaid applied. Will return 10/03/2022 for next refill. Tolerated procedure well.

## 2022-09-01 ENCOUNTER — Telehealth: Payer: Self-pay | Admitting: *Deleted

## 2022-09-01 NOTE — Telephone Encounter (Signed)
Notified of normal CEA results. F/U as scheduled.

## 2022-09-28 ENCOUNTER — Inpatient Hospital Stay: Payer: BC Managed Care – PPO

## 2022-09-29 ENCOUNTER — Inpatient Hospital Stay: Payer: BC Managed Care – PPO | Attending: Oncology

## 2022-09-29 VITALS — BP 129/87 | HR 77 | Temp 97.9°F | Resp 20 | Ht 68.0 in | Wt 142.4 lb

## 2022-09-29 DIAGNOSIS — Z85038 Personal history of other malignant neoplasm of large intestine: Secondary | ICD-10-CM | POA: Diagnosis present

## 2022-09-29 DIAGNOSIS — Z95828 Presence of other vascular implants and grafts: Secondary | ICD-10-CM

## 2022-09-29 DIAGNOSIS — C187 Malignant neoplasm of sigmoid colon: Secondary | ICD-10-CM

## 2022-09-29 MED ORDER — SODIUM CHLORIDE 0.9% FLUSH: 10.0000 mL | INTRAVENOUS | Status: DC | PRN

## 2022-09-29 MED ORDER — SODIUM CHLORIDE (PF) 0.9 % IJ SOLN
Freq: Once | INTRAMUSCULAR | Status: AC
  Filled 2022-09-29: qty 5

## 2022-09-29 MED ORDER — HEPARIN SOD (PORK) LOCK FLUSH 100 UNIT/ML IV SOLN: 500.0000 [IU] | Freq: Once | INTRAVENOUS | Status: DC | PRN

## 2022-09-29 NOTE — Patient Instructions (Signed)

## 2022-09-29 NOTE — Progress Notes (Signed)
  Accessed Medtronic Pump LUQ abdomen using sterile technique without difficulty. Aspirated 3.2 ml (Ipad noted 2.6 ml). Flushed with 20 ml NS with 25,000 units heparin aspirating every 3 ml. Needle removed and bandaid applied. Will return 10/26/2022 for next refill. Tolerated procedure well.

## 2022-10-20 ENCOUNTER — Encounter: Payer: Self-pay | Admitting: *Deleted

## 2022-10-26 ENCOUNTER — Inpatient Hospital Stay: Payer: BC Managed Care – PPO

## 2022-10-26 ENCOUNTER — Inpatient Hospital Stay: Payer: BC Managed Care – PPO | Attending: Oncology

## 2022-10-26 VITALS — BP 104/83 | HR 74 | Temp 98.5°F | Resp 20 | Ht 68.0 in | Wt 142.5 lb

## 2022-10-26 DIAGNOSIS — Z95828 Presence of other vascular implants and grafts: Secondary | ICD-10-CM

## 2022-10-26 DIAGNOSIS — C187 Malignant neoplasm of sigmoid colon: Secondary | ICD-10-CM

## 2022-10-26 DIAGNOSIS — Z85038 Personal history of other malignant neoplasm of large intestine: Secondary | ICD-10-CM | POA: Diagnosis not present

## 2022-10-26 MED ORDER — HEPARIN SOD (PORK) LOCK FLUSH 100 UNIT/ML IV SOLN
500.0000 [IU] | Freq: Once | INTRAVENOUS | Status: AC | PRN
  Administered 2022-10-26: 500 [IU] via INTRAVENOUS

## 2022-10-26 MED ORDER — SODIUM CHLORIDE (PF) 0.9 % IJ SOLN
Freq: Once | INTRAMUSCULAR | Status: AC
  Filled 2022-10-26: qty 5

## 2022-10-26 MED ORDER — SODIUM CHLORIDE 0.9% FLUSH
10.0000 mL | INTRAVENOUS | Status: DC | PRN
  Administered 2022-10-26: 10 mL via INTRAVENOUS

## 2022-10-26 NOTE — Patient Instructions (Signed)

## 2022-10-26 NOTE — Progress Notes (Signed)
Accessed Medtronic Pump LUQ abdomen using sterile technique without difficulty. Aspirated 5.0 ml (Ipad noted 3.2 ml). Flushed with 20 ml NS with 25,000 units heparin aspirating every 3 ml. Needle removed and bandaid applied. Will return 11/23/2022 for next refill. Tolerated procedure well.

## 2022-10-30 ENCOUNTER — Other Ambulatory Visit: Payer: Self-pay | Admitting: Obstetrics and Gynecology

## 2022-10-30 DIAGNOSIS — Z1231 Encounter for screening mammogram for malignant neoplasm of breast: Secondary | ICD-10-CM

## 2022-11-21 ENCOUNTER — Encounter: Payer: Self-pay | Admitting: Oncology

## 2022-11-23 ENCOUNTER — Inpatient Hospital Stay: Payer: BC Managed Care – PPO

## 2022-11-27 ENCOUNTER — Inpatient Hospital Stay: Payer: BC Managed Care – PPO | Attending: Oncology

## 2022-11-27 VITALS — BP 127/87 | HR 76 | Temp 98.1°F | Resp 18 | Ht 68.0 in | Wt 144.2 lb

## 2022-11-27 DIAGNOSIS — Z95828 Presence of other vascular implants and grafts: Secondary | ICD-10-CM

## 2022-11-27 DIAGNOSIS — C187 Malignant neoplasm of sigmoid colon: Secondary | ICD-10-CM | POA: Diagnosis present

## 2022-11-27 MED ORDER — SODIUM CHLORIDE (PF) 0.9 % IJ SOLN
Freq: Once | INTRAMUSCULAR | Status: AC
  Filled 2022-11-27: qty 5

## 2022-11-27 NOTE — Patient Instructions (Signed)

## 2022-11-27 NOTE — Progress Notes (Signed)
Accessed Medtronic Pump LUQ abdomen using sterile technique without difficulty. Aspirated 2.0 ml (Ipad noted 0.8 ml). Flushed with 20 ml NS with 25,000 units heparin aspirating every 3 ml. Needle removed and bandaid applied. Will return 12/30/2022 for next refill. Tolerated procedure well.

## 2022-12-19 ENCOUNTER — Other Ambulatory Visit: Payer: Self-pay | Admitting: Otolaryngology

## 2022-12-22 ENCOUNTER — Ambulatory Visit
Admission: RE | Admit: 2022-12-22 | Discharge: 2022-12-22 | Disposition: A | Discharge status: Home / Self Care | Payer: BC Managed Care – PPO | Source: Ambulatory Visit | Attending: Obstetrics and Gynecology | Admitting: Obstetrics and Gynecology

## 2022-12-22 DIAGNOSIS — Z1231 Encounter for screening mammogram for malignant neoplasm of breast: Secondary | ICD-10-CM

## 2022-12-29 ENCOUNTER — Inpatient Hospital Stay: Payer: BC Managed Care – PPO | Attending: Oncology

## 2022-12-29 VITALS — BP 112/82 | HR 77 | Temp 98.0°F | Resp 18 | Ht 68.0 in | Wt 143.2 lb

## 2022-12-29 DIAGNOSIS — C787 Secondary malignant neoplasm of liver and intrahepatic bile duct: Secondary | ICD-10-CM | POA: Insufficient documentation

## 2022-12-29 DIAGNOSIS — Z95828 Presence of other vascular implants and grafts: Secondary | ICD-10-CM

## 2022-12-29 DIAGNOSIS — C187 Malignant neoplasm of sigmoid colon: Secondary | ICD-10-CM | POA: Insufficient documentation

## 2022-12-29 MED ORDER — SODIUM CHLORIDE (PF) 0.9 % IJ SOLN
Freq: Once | INTRAMUSCULAR | Status: AC
  Filled 2022-12-29: qty 5

## 2022-12-29 MED ORDER — HEPARIN SOD (PORK) LOCK FLUSH 100 UNIT/ML IV SOLN
500.0000 [IU] | Freq: Once | INTRAVENOUS | Status: AC | PRN
  Administered 2022-12-29: 500 [IU] via INTRAVENOUS

## 2022-12-29 MED ORDER — SODIUM CHLORIDE 0.9% FLUSH
10.0000 mL | INTRAVENOUS | Status: DC | PRN
  Administered 2022-12-29: 10 mL via INTRAVENOUS

## 2022-12-29 NOTE — Progress Notes (Signed)
  Accessed Medtronic Pump LUQ abdomen using sterile technique without difficulty. Aspirated 2.1 ml (Ipad noted 2.1 ml). Flushed with 20 ml NS with 25,000 units heparin aspirating every 3 ml. Needle removed and bandaid applied. Will return 01/26/2023 for next refill. Tolerated procedure well.

## 2022-12-29 NOTE — Patient Instructions (Signed)

## 2023-01-30 ENCOUNTER — Encounter (HOSPITAL_BASED_OUTPATIENT_CLINIC_OR_DEPARTMENT_OTHER): Payer: Self-pay

## 2023-01-30 ENCOUNTER — Inpatient Hospital Stay: Payer: BC Managed Care – PPO

## 2023-01-30 ENCOUNTER — Inpatient Hospital Stay (HOSPITAL_BASED_OUTPATIENT_CLINIC_OR_DEPARTMENT_OTHER): Payer: BC Managed Care – PPO | Admitting: Oncology

## 2023-01-30 ENCOUNTER — Inpatient Hospital Stay: Payer: BC Managed Care – PPO | Attending: Oncology

## 2023-01-30 ENCOUNTER — Ambulatory Visit (HOSPITAL_BASED_OUTPATIENT_CLINIC_OR_DEPARTMENT_OTHER)
Admission: RE | Admit: 2023-01-30 | Discharge: 2023-01-30 | Disposition: A | Discharge status: Home / Self Care | Payer: BC Managed Care – PPO | Source: Ambulatory Visit | Attending: Oncology | Admitting: Oncology

## 2023-01-30 VITALS — BP 131/92 | HR 86 | Temp 97.9°F | Resp 18 | Ht 68.0 in | Wt 145.6 lb

## 2023-01-30 DIAGNOSIS — C187 Malignant neoplasm of sigmoid colon: Secondary | ICD-10-CM

## 2023-01-30 DIAGNOSIS — Z800 Family history of malignant neoplasm of digestive organs: Secondary | ICD-10-CM | POA: Insufficient documentation

## 2023-01-30 DIAGNOSIS — N281 Cyst of kidney, acquired: Secondary | ICD-10-CM | POA: Insufficient documentation

## 2023-01-30 DIAGNOSIS — Z95828 Presence of other vascular implants and grafts: Secondary | ICD-10-CM

## 2023-01-30 DIAGNOSIS — D701 Agranulocytosis secondary to cancer chemotherapy: Secondary | ICD-10-CM | POA: Insufficient documentation

## 2023-01-30 DIAGNOSIS — C779 Secondary and unspecified malignant neoplasm of lymph node, unspecified: Secondary | ICD-10-CM | POA: Insufficient documentation

## 2023-01-30 DIAGNOSIS — D6959 Other secondary thrombocytopenia: Secondary | ICD-10-CM | POA: Diagnosis not present

## 2023-01-30 LAB — BASIC METABOLIC PANEL - CANCER CENTER ONLY
Anion gap: 6 (ref 5–15)
BUN: 11 mg/dL (ref 6–20)
CO2: 26 mmol/L (ref 22–32)
Calcium: 9.8 mg/dL (ref 8.9–10.3)
Chloride: 107 mmol/L (ref 98–111)
Creatinine: 0.76 mg/dL (ref 0.44–1.00)
GFR, Estimated: >60 mL/min (ref >60)
Glucose, Bld: 104 mg/dL — ABNORMAL HIGH (ref 70–99)
Potassium: 4.1 mmol/L (ref 3.5–5.1)
Sodium: 139 mmol/L (ref 135–145)

## 2023-01-30 LAB — CEA (ACCESS): CEA (CHCC): <1 ng/mL (ref 0.00–5.00)

## 2023-01-30 MED ORDER — HEPARIN SOD (PORK) LOCK FLUSH 100 UNIT/ML IV SOLN: 500.0000 [IU] | Freq: Once | INTRAVENOUS | Status: DC | PRN

## 2023-01-30 MED ORDER — SODIUM CHLORIDE (PF) 0.9 % IJ SOLN
Freq: Once | INTRAMUSCULAR | Status: AC
  Filled 2023-01-30: qty 5

## 2023-01-30 MED ORDER — ALTEPLASE 2 MG IJ SOLR: 2.0000 mg | Freq: Once | INTRAMUSCULAR | Status: DC | PRN

## 2023-01-30 MED ORDER — IOHEXOL 300 MG/ML  SOLN
100.0000 mL | Freq: Once | INTRAMUSCULAR | Status: AC | PRN
  Administered 2023-01-30: 80 mL via INTRAVENOUS

## 2023-01-30 MED ORDER — HEPARIN SOD (PORK) LOCK FLUSH 100 UNIT/ML IV SOLN
500.0000 [IU] | Freq: Once | INTRAVENOUS | Status: AC
  Administered 2023-01-30: 500 [IU] via INTRAVENOUS

## 2023-01-30 MED ORDER — SODIUM CHLORIDE 0.9% FLUSH: 10.0000 mL | INTRAVENOUS | Status: DC | PRN

## 2023-01-30 NOTE — Progress Notes (Signed)
Accessed Medtronic Pump LUQ abdomen using sterile technique without difficulty. Aspirated 2.0 ml (Ipad noted 0.8 ml). Flushed with 20 ml NS with 25,000 units heparin aspirating every 3 ml. Needle removed and bandaid applied. Will return 03/04/2023 for next refill. Tolerated procedure well.

## 2023-01-30 NOTE — Progress Notes (Signed)
[facility] OFFICE PROGRESS NOTE   Diagnosis: Colon cancer  INTERVAL HISTORY:   Patricia Hudson returns as scheduled.  She feels well.  No complaint.  No difficulty with bowel function.  She is working.  The hepatic infusion pump is flushed monthly.  The Port-A-Cath is flushed every 8 weeks.  The right neck nodule was removed 12/19/2022 and the pathology revealed a benign epidermal cyst.  Objective:  Vital signs in last 24 hours:  Blood pressure (!) 131/92, pulse 86, temperature 97.9 F (36.6 C), temperature source Oral, resp. rate 18, height '5\' 8"'$  (1.727 m), weight 145 lb 9.6 oz (66 kg), last menstrual period 04/20/2013, SpO2 99 %.    HEENT: Neck without mass, healing incision at the right anterior cervical region Lymphatics: No cervical, supraclavicular, axillary, or inguinal nodes Resp: Clear bilaterally Cardio: Rate and rhythm GI: No hepatosplenomegaly, no mass, left abdomen infusion pump Vascular: No leg edema   Portacath/PICC-without erythema  Lab Results:  Lab Results  Component Value Date   WBC 4.6 02/21/2021   HGB 13.3 02/21/2021   HCT 39.5 02/21/2021   MCV 91.2 02/21/2021   PLT 181 02/21/2021   NEUTROABS 3.0 02/21/2021    CMP  Lab Results  Component Value Date   NA 139 01/30/2023   K 4.1 01/30/2023   CL 107 01/30/2023   CO2 26 01/30/2023   GLUCOSE 104 (H) 01/30/2023   BUN 11 01/30/2023   CREATININE 0.76 01/30/2023   CALCIUM 9.8 01/30/2023   PROT 7.2 08/18/2021   ALBUMIN 4.7 08/18/2021   AST 19 08/18/2021   ALT 12 08/18/2021   ALKPHOS 99 08/18/2021   BILITOT 0.5 08/18/2021   GFRNONAA >60 01/30/2023   GFRAA >60 01/27/2020    Lab Results  Component Value Date   CEA1 <1.00 08/18/2021   CEA <1.00 01/30/2023    Lab Results  Component Value Date   INR 1.27 07/13/2016   LABPROT 16.0 (H) 07/13/2016    Imaging:  CT CHEST ABDOMEN PELVIS W CONTRAST  Result Date: 01/30/2023 CLINICAL DATA:  History of colon cancer status post partial  colectomy, currently ongoing chemotherapy. Follow-up. * Tracking Code: BO * EXAM: CT CHEST, ABDOMEN, AND PELVIS WITH CONTRAST TECHNIQUE: Multidetector CT imaging of the chest, abdomen and pelvis was performed following the standard protocol during bolus administration of intravenous contrast. RADIATION DOSE REDUCTION: This exam was performed according to the departmental dose-optimization program which includes automated exposure control, adjustment of the mA and/or kV according to patient size and/or use of iterative reconstruction technique. CONTRAST:  19m OMNIPAQUE IOHEXOL 300 MG/ML  SOLN COMPARISON:  Multiple priors including most recent CT July 25, 2022. FINDINGS: CT CHEST FINDINGS Cardiovascular: Accessed right chest Port-A-Cath with tip in the right atrium. Normal caliber thoracic aorta. No central pulmonary embolus on this nondedicated study. Normal size heart. No significant pericardial effusion/thickening. Mediastinum/Nodes: No suspicious thyroid nodule. No pathologically enlarged mediastinal, hilar or axillary lymph nodes. The esophagus is grossly unremarkable. Lungs/Pleura: Biapical pleuroparenchymal scarring. Solid 3 mm pulmonary nodule in the right lung apex on image 30/4, unchanged dating back to 2017. No new suspicious pulmonary nodules or masses. No pleural effusion. No pneumothorax. Musculoskeletal: No aggressive lytic or blastic lesion of bone. CT ABDOMEN PELVIS FINDINGS Hepatobiliary: Similar postsurgical changes of partial left hepatectomy with now new enhancing nodularity in the surgical bed. Stable bilobar hepatic cysts. No new suspicious hepatic lesion. Stable position of the hepatic arterial infusion pump catheter which terminates in the area of the gastroduodenal artery. Patent  portal vein. Gallbladder surgically absent.  No biliary ductal dilation. Pancreas: No pancreatic ductal dilation or evidence of acute inflammation. Spleen: No splenomegaly or focal splenic lesion.  Adrenals/Urinary Tract: Bilateral adrenal glands appear normal. No hydronephrosis. Kidneys demonstrate symmetric enhancement and excretion of contrast material. 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 Urinary bladder is unremarkable for degree of distension. Stomach/Bowel: Radiopaque enteric contrast material traverses the hepatic flexure. Stomach is unremarkable for degree of distension. No pathologic dilation of small or large bowel. Similar surgical changes of prior partial sigmoidectomy with rectosigmoid anastomotic sutures near the sacral promontory. No new suspicious nodularity along the suture line. Vascular/Lymphatic: Aortic atherosclerosis. Normal caliber abdominal aorta. Smooth IVC contours. Portal, splenic and superior mesenteric veins are patent. No pathologically enlarged abdominal or pelvic lymph nodes. Reproductive: Uterus and bilateral adnexa are unremarkable. Other: No significant abdominopelvic free fluid. Infusion pump in the anterior left lower quadrant abdominal wall. Musculoskeletal: No aggressive lytic or blastic lesion of bone. Mixed lesion in the left iliac bone on coronal image 32/5 is present and unchanged dating back to CT July 07, 2016 compatible with a benign fibro-osseous lesion. IMPRESSION: 1. Similar postsurgical changes of prior partial sigmoidectomy and partial left hepatectomy without evidence of local recurrence. 2. No evidence of new or progressive metastatic disease within the chest, abdomen or pelvis. 3.  Aortic Atherosclerosis (ICD10-I70.0). Electronically Signed   By: Dahlia Bailiff M.D.   On: 01/30/2023 10:16    Medications: I have reviewed the patient's current medications.   Assessment/Plan: Adenocarcinoma of the sigmoid colon, status post a colonoscopic biopsy 07/07/2016 Mismatch repair protein expression-normal Sigmoid mass noted at 20 cm from the anal verge Staging CTs of the chest, abdomen, and pelvis negative for evidence of  metastatic disease other than an indeterminate 10 mm right liver lesion MRI of the abdomen 07/07/2016 revealed multiple bilateral hepatic cyst. 7 mm inferior subcapsular right liver lesion suspicious for a metastasis PET scan 07/21/2016-hypermetabolic sigmoid colon mass, mildly hypermetabolic segment 6 liver lesion Laparoscopic assisted rectosigmoid colectomy and segment 8 liver resection 07/27/2016, pathology confirmed a T3, N2b, M1 tumor, 11/16 lymph nodes positive for metastatic carcinoma Foundation 1 testing found the tumor to be microsatellite stable, tumor mutation burden low, no BRAF or RAS mutation No loss of mismatch repair protein expression, MSI-stable Cycle 1 CAPOX 08/22/2016 Cycle 2 CAPOX 09/12/2016 Cycle 3 CAPOX 10/03/2016 (Xeloda dose reduced to 3 tablets every morning and 2 tablets every evening beginning with cycle 3) Cycle 4 CAPOX 10/31/2016 (Xeloda re-escalated to original dose) Cycle 5 CAPOX 11/21/2016 Cycle 6 CAPOX 12/12/2016 Cycle 7 CAPOX 01/09/2017 (oxaliplatin held) Cycle 8 CAPOX 01/30/2017 (oxaliplatin dose reduced) CT scans 02/27/2017-negative for recurrent colon cancer CTs 08/14/2017-negative for recurrent cancer CTs 02/12/2018-negative for recurrent cancer CTs 07/29/2018-negative for recurrent cancer  CTs 01/27/2019-negative for recurrent cancer, new 8 mm subpleural left lower lobe nodule CT chest 04/29/2019- resolution of left lower lobe nodule CT 07/29/2019- negative for recurrent cancer CTs 01/27/2020-stable 4 mm right apical lung nodule.  New 1.6 x 1.6 cm lesion identified in the lateral segment left liver.  Stable 9 mm subcapsular lesion interpolar left kidney. MRI liver 3/10/ 2021-15 mm enhancing lesion in segment 3, new from September 2020, subcentimeter hemorrhagic cyst in the left kidney Robotic assisted partial hepatectomy (segment 3), cholecystectomy, portal lymphadenectomy, and insertion of hepatic infusion pump 02/12/2020, 0/3 nodes, 1.9 cm adenocarcinoma in  segment 3, negative margins 03/11/2020-FUDR and infusional 5-FU #1 04/08/2020-FUDR #2 05/06/2020-FUDR#3 06/03/2020-FUDR#4 canceled secondary to elevation of the  alkaline phosphatase 07/15/2020-FUDR#4, 50% dose reduction 09/09/2000-CTs chest/abdomen/pelvis-negative 12/02/2020- CTs negative for recurrent disease 02/21/2021-CTs negative for recurrent disease, stable surgical changes in the liver CTs 08/18/2021-no evidence of recurrent disease, stable postoperative findings of partial hepatectomy CTs 02/21/2022-no evidence of recurrent disease CTs 07/25/2022-no evidence of recurrent disease CTs 01/30/2023-no evidence of recurrent disease   2.   History of rectal bleeding secondary to #1   3.   Multiple polyps on the colonoscopy 07/07/2016 Admission 07/13/2016 with  acute GI bleeding secondary to an ulcer at the cecum Colonoscopy 10/08/2017-small polyps removed from the cecum-colonic mucosa with lymphoid aggregates-no dysplasia or malignancy   4.   Family history of breast and pancreas cancer   5.  Delayed nausea following cycle 1 CAPOX-emend added with cycle 2   6. Diarrhea following cycle 2 and cycle 6 CAPOX   7.  Neutropenia/thrombocytopenia secondary to chemotherapy   8.  History of mild oxaliplatin neuropathy- resolved       Disposition: Patricia Hudson remains in clinical remission from colon cancer.  There is no evidence of recurrent disease on the CTs today.  The CEA remains normal.  A Port-A-Cath and hepatic infusion pump remain in place.  She will return for flushes at the cancer center. Patricia Hudson will be scheduled for an office visit and surveillance imaging in 6 months.  Betsy Coder, MD  01/30/2023  10:47 AM

## 2023-01-30 NOTE — Patient Instructions (Signed)

## 2023-02-27 ENCOUNTER — Ambulatory Visit: Payer: BC Managed Care – PPO

## 2023-03-02 ENCOUNTER — Inpatient Hospital Stay: Payer: BC Managed Care – PPO | Attending: Oncology

## 2023-03-02 VITALS — BP 115/81 | HR 79 | Temp 98.4°F | Resp 18 | Ht 68.0 in | Wt 145.0 lb

## 2023-03-02 DIAGNOSIS — C187 Malignant neoplasm of sigmoid colon: Secondary | ICD-10-CM | POA: Diagnosis present

## 2023-03-02 DIAGNOSIS — Z95828 Presence of other vascular implants and grafts: Secondary | ICD-10-CM

## 2023-03-02 MED ORDER — SODIUM CHLORIDE (PF) 0.9 % IJ SOLN
Freq: Once | INTRAMUSCULAR | Status: AC
  Filled 2023-03-02: qty 5

## 2023-03-02 NOTE — Patient Instructions (Signed)

## 2023-03-02 NOTE — Progress Notes (Signed)
  Accessed Medtronic Pump LUQ abdomen using sterile technique without difficulty. Aspirated 2.0 ml (Ipad noted 1.3 ml). Flushed with 20 ml NS with 25,000 units heparin aspirating every 3 ml. Needle removed and bandaid applied. Will return the week of 04/03/2023 for next refill. Tolerated procedure well.

## 2023-03-29 ENCOUNTER — Inpatient Hospital Stay: Payer: BC Managed Care – PPO | Attending: Oncology

## 2023-03-29 ENCOUNTER — Ambulatory Visit: Payer: BC Managed Care – PPO

## 2023-03-29 VITALS — BP 111/83 | HR 93 | Temp 98.1°F | Resp 18 | Ht 68.0 in | Wt 142.0 lb

## 2023-03-29 DIAGNOSIS — C187 Malignant neoplasm of sigmoid colon: Secondary | ICD-10-CM | POA: Insufficient documentation

## 2023-03-29 DIAGNOSIS — Z95828 Presence of other vascular implants and grafts: Secondary | ICD-10-CM

## 2023-03-29 MED ORDER — HEPARIN SOD (PORK) LOCK FLUSH 100 UNIT/ML IV SOLN
500.0000 [IU] | Freq: Once | INTRAVENOUS | Status: AC | PRN
  Administered 2023-03-29: 500 [IU] via INTRAVENOUS

## 2023-03-29 MED ORDER — SODIUM CHLORIDE 0.9% FLUSH
10.0000 mL | INTRAVENOUS | Status: DC | PRN
  Administered 2023-03-29: 10 mL via INTRAVENOUS

## 2023-03-29 MED ORDER — SODIUM CHLORIDE (PF) 0.9 % IJ SOLN
Freq: Once | INTRAMUSCULAR | Status: AC
  Filled 2023-03-29: qty 5

## 2023-03-29 NOTE — Patient Instructions (Signed)

## 2023-03-29 NOTE — Progress Notes (Signed)
Accessed Medtronic Pump LUQ abdomen using sterile technique without difficulty. Aspirated 5.0 ml (Ipad noted 3.9 ml). Flushed with 20 ml NS with 25,000 units heparin aspirating every 3 ml. Needle removed and bandaid applied. Will return the week of 04/30/2023 for next refill. Tolerated procedure well.

## 2023-04-02 ENCOUNTER — Inpatient Hospital Stay: Payer: BC Managed Care – PPO

## 2023-04-27 ENCOUNTER — Ambulatory Visit: Payer: BC Managed Care – PPO

## 2023-04-27 ENCOUNTER — Inpatient Hospital Stay: Payer: BC Managed Care – PPO | Attending: Oncology

## 2023-04-27 VITALS — BP 105/86 | HR 80 | Temp 98.0°F | Resp 18 | Ht 68.0 in | Wt 141.5 lb

## 2023-04-27 DIAGNOSIS — Z85038 Personal history of other malignant neoplasm of large intestine: Secondary | ICD-10-CM | POA: Diagnosis not present

## 2023-04-27 DIAGNOSIS — C187 Malignant neoplasm of sigmoid colon: Secondary | ICD-10-CM

## 2023-04-27 DIAGNOSIS — Z95828 Presence of other vascular implants and grafts: Secondary | ICD-10-CM

## 2023-04-27 MED ORDER — SODIUM CHLORIDE (PF) 0.9 % IJ SOLN
Freq: Once | INTRAMUSCULAR | Status: AC
  Filled 2023-04-27: qty 5

## 2023-04-27 NOTE — Patient Instructions (Signed)

## 2023-04-27 NOTE — Progress Notes (Signed)
Accessed Medtronic Pump LUQ abdomen using sterile technique without difficulty. Aspirated 4.0 ml (Ipad noted 2.6 ml). Flushed with 20 ml NS with 25,000 units heparin aspirating every 3 ml. Needle removed and bandaid applied. Will return on 05/29/2023 for next refill. Tolerated procedure well.

## 2023-04-30 ENCOUNTER — Ambulatory Visit: Payer: BC Managed Care – PPO

## 2023-04-30 ENCOUNTER — Inpatient Hospital Stay: Payer: BC Managed Care – PPO

## 2023-05-02 ENCOUNTER — Inpatient Hospital Stay: Payer: BC Managed Care – PPO

## 2023-05-28 ENCOUNTER — Inpatient Hospital Stay: Payer: BC Managed Care – PPO | Attending: Oncology

## 2023-05-28 VITALS — BP 120/87 | HR 75 | Temp 98.4°F | Resp 20 | Ht 68.0 in | Wt 141.5 lb

## 2023-05-28 DIAGNOSIS — C187 Malignant neoplasm of sigmoid colon: Secondary | ICD-10-CM

## 2023-05-28 DIAGNOSIS — Z85038 Personal history of other malignant neoplasm of large intestine: Secondary | ICD-10-CM | POA: Insufficient documentation

## 2023-05-28 DIAGNOSIS — Z95828 Presence of other vascular implants and grafts: Secondary | ICD-10-CM

## 2023-05-28 MED ORDER — SODIUM CHLORIDE 0.9% FLUSH
10.0000 mL | INTRAVENOUS | Status: DC | PRN
  Administered 2023-05-28: 10 mL via INTRAVENOUS

## 2023-05-28 MED ORDER — HEPARIN SOD (PORK) LOCK FLUSH 100 UNIT/ML IV SOLN
500.0000 [IU] | Freq: Once | INTRAVENOUS | Status: AC | PRN
  Administered 2023-05-28: 500 [IU] via INTRAVENOUS

## 2023-05-28 MED ORDER — SODIUM CHLORIDE (PF) 0.9 % IJ SOLN
Freq: Once | INTRAMUSCULAR | Status: AC
  Filled 2023-05-28: qty 5

## 2023-05-28 NOTE — Progress Notes (Signed)
Accessed Medtronic Pump LUQ abdomen using sterile technique without difficulty. Aspirated 3.0 ml (Ipad noted 1.4 ml). Flushed with 20 ml NS with 25,000 units heparin aspirating every 3 ml. Needle removed and bandaid applied. Will return on 06/21/2023 for next refill. Tolerated procedure well.

## 2023-05-28 NOTE — Patient Instructions (Signed)

## 2023-05-29 ENCOUNTER — Ambulatory Visit: Payer: BC Managed Care – PPO

## 2023-05-29 ENCOUNTER — Inpatient Hospital Stay: Payer: BC Managed Care – PPO

## 2023-06-01 ENCOUNTER — Inpatient Hospital Stay: Payer: BC Managed Care – PPO

## 2023-06-20 ENCOUNTER — Ambulatory Visit: Payer: BC Managed Care – PPO

## 2023-06-21 ENCOUNTER — Inpatient Hospital Stay: Payer: BC Managed Care – PPO | Attending: Oncology

## 2023-06-21 VITALS — BP 119/77 | HR 68 | Temp 98.1°F | Resp 18 | Ht 68.0 in | Wt 137.2 lb

## 2023-06-21 DIAGNOSIS — Z85038 Personal history of other malignant neoplasm of large intestine: Secondary | ICD-10-CM | POA: Insufficient documentation

## 2023-06-21 DIAGNOSIS — Z95828 Presence of other vascular implants and grafts: Secondary | ICD-10-CM

## 2023-06-21 DIAGNOSIS — C187 Malignant neoplasm of sigmoid colon: Secondary | ICD-10-CM

## 2023-06-21 MED ORDER — SODIUM CHLORIDE (PF) 0.9 % IJ SOLN
Freq: Once | INTRAMUSCULAR | Status: AC
  Filled 2023-06-21: qty 5

## 2023-06-21 NOTE — Progress Notes (Signed)
  Accessed Medtronic Pump LUQ abdomen using sterile technique without difficulty. Aspirated 6.5 ml (Ipad noted 5.6 ml). Flushed with 20 ml NS with 25,000 units heparin aspirating every 3 ml. Needle removed and bandaid applied. Will return on 07/24/2023 for next refill. Tolerated procedure well.

## 2023-06-21 NOTE — Patient Instructions (Signed)

## 2023-07-02 ENCOUNTER — Inpatient Hospital Stay: Payer: BC Managed Care – PPO

## 2023-07-12 ENCOUNTER — Ambulatory Visit: Payer: BC Managed Care – PPO

## 2023-07-24 ENCOUNTER — Inpatient Hospital Stay: Payer: BC Managed Care – PPO | Attending: Oncology

## 2023-07-24 VITALS — BP 126/92 | HR 78 | Temp 98.1°F | Resp 18 | Ht 68.0 in | Wt 139.2 lb

## 2023-07-24 DIAGNOSIS — Z800 Family history of malignant neoplasm of digestive organs: Secondary | ICD-10-CM | POA: Diagnosis not present

## 2023-07-24 DIAGNOSIS — Z9049 Acquired absence of other specified parts of digestive tract: Secondary | ICD-10-CM | POA: Insufficient documentation

## 2023-07-24 DIAGNOSIS — Z95828 Presence of other vascular implants and grafts: Secondary | ICD-10-CM

## 2023-07-24 DIAGNOSIS — Z803 Family history of malignant neoplasm of breast: Secondary | ICD-10-CM | POA: Insufficient documentation

## 2023-07-24 DIAGNOSIS — C187 Malignant neoplasm of sigmoid colon: Secondary | ICD-10-CM

## 2023-07-24 DIAGNOSIS — R110 Nausea: Secondary | ICD-10-CM | POA: Insufficient documentation

## 2023-07-24 DIAGNOSIS — D6959 Other secondary thrombocytopenia: Secondary | ICD-10-CM | POA: Insufficient documentation

## 2023-07-24 DIAGNOSIS — Z85038 Personal history of other malignant neoplasm of large intestine: Secondary | ICD-10-CM | POA: Diagnosis present

## 2023-07-24 DIAGNOSIS — R197 Diarrhea, unspecified: Secondary | ICD-10-CM | POA: Insufficient documentation

## 2023-07-24 DIAGNOSIS — R918 Other nonspecific abnormal finding of lung field: Secondary | ICD-10-CM | POA: Insufficient documentation

## 2023-07-24 MED ORDER — SODIUM CHLORIDE (PF) 0.9 % IJ SOLN
Freq: Once | INTRAMUSCULAR | Status: AC
  Filled 2023-07-24: qty 5

## 2023-07-24 MED ORDER — HEPARIN SOD (PORK) LOCK FLUSH 100 UNIT/ML IV SOLN: 500.0000 [IU] | Freq: Once | INTRAVENOUS | Status: DC | PRN

## 2023-07-24 MED ORDER — SODIUM CHLORIDE 0.9% FLUSH: 10.0000 mL | INTRAVENOUS | Status: DC | PRN

## 2023-07-24 NOTE — Patient Instructions (Signed)

## 2023-07-24 NOTE — Progress Notes (Signed)
Accessed Medtronic Pump LUQ abdomen using sterile technique without difficulty. Aspirated 2.0 ml (Ipad noted 0.2 ml). Flushed with 20 ml NS with 25,000 units heparin aspirating every 3 ml. Needle removed and bandaid applied.

## 2023-08-01 ENCOUNTER — Ambulatory Visit: Payer: BC Managed Care – PPO | Admitting: Oncology

## 2023-08-01 ENCOUNTER — Other Ambulatory Visit: Payer: BC Managed Care – PPO

## 2023-08-01 ENCOUNTER — Inpatient Hospital Stay (HOSPITAL_BASED_OUTPATIENT_CLINIC_OR_DEPARTMENT_OTHER): Payer: BC Managed Care – PPO | Admitting: Oncology

## 2023-08-01 ENCOUNTER — Encounter: Payer: Self-pay | Admitting: *Deleted

## 2023-08-01 ENCOUNTER — Inpatient Hospital Stay: Payer: BC Managed Care – PPO

## 2023-08-01 ENCOUNTER — Ambulatory Visit: Payer: BC Managed Care – PPO

## 2023-08-01 ENCOUNTER — Ambulatory Visit (HOSPITAL_BASED_OUTPATIENT_CLINIC_OR_DEPARTMENT_OTHER)
Admission: RE | Admit: 2023-08-01 | Discharge: 2023-08-01 | Disposition: A | Discharge status: Home / Self Care | Payer: BC Managed Care – PPO | Source: Ambulatory Visit | Attending: Oncology | Admitting: Oncology

## 2023-08-01 VITALS — BP 129/88 | HR 90 | Temp 98.2°F | Resp 18 | Ht 68.0 in | Wt 140.0 lb

## 2023-08-01 DIAGNOSIS — C187 Malignant neoplasm of sigmoid colon: Secondary | ICD-10-CM

## 2023-08-01 DIAGNOSIS — Z95828 Presence of other vascular implants and grafts: Secondary | ICD-10-CM

## 2023-08-01 LAB — CBC WITH DIFFERENTIAL (CANCER CENTER ONLY)
Abs Immature Granulocytes: 0.01 10*3/uL (ref 0.00–0.07)
Basophils Absolute: 0 10*3/uL (ref 0.0–0.1)
Basophils Relative: 1 %
Eosinophils Absolute: 0.3 10*3/uL (ref 0.0–0.5)
Eosinophils Relative: 8 %
HCT: 41.5 % (ref 36.0–46.0)
Hemoglobin: 13.9 g/dL (ref 12.0–15.0)
Immature Granulocytes: 0 %
Lymphocytes Relative: 26 %
Lymphs Abs: 1.1 10*3/uL (ref 0.7–4.0)
MCH: 31.2 pg (ref 26.0–34.0)
MCHC: 33.5 g/dL (ref 30.0–36.0)
MCV: 93.3 fL (ref 80.0–100.0)
Monocytes Absolute: 0.5 10*3/uL (ref 0.1–1.0)
Monocytes Relative: 11 %
Neutro Abs: 2.4 10*3/uL (ref 1.7–7.7)
Neutrophils Relative %: 54 %
Platelet Count: 173 10*3/uL (ref 150–400)
RBC: 4.45 MIL/uL (ref 3.87–5.11)
RDW: 12.6 % (ref 11.5–15.5)
WBC Count: 4.4 10*3/uL (ref 4.0–10.5)
nRBC: 0 % (ref 0.0–0.2)

## 2023-08-01 LAB — BASIC METABOLIC PANEL - CANCER CENTER ONLY
Anion gap: 7 (ref 5–15)
BUN: 10 mg/dL (ref 6–20)
CO2: 26 mmol/L (ref 22–32)
Calcium: 9.1 mg/dL (ref 8.9–10.3)
Chloride: 107 mmol/L (ref 98–111)
Creatinine: 0.81 mg/dL (ref 0.44–1.00)
GFR, Estimated: >60 mL/min (ref >60)
Glucose, Bld: 99 mg/dL (ref 70–99)
Potassium: 4.7 mmol/L (ref 3.5–5.1)
Sodium: 140 mmol/L (ref 135–145)

## 2023-08-01 LAB — CEA (ACCESS): CEA (CHCC): <1 ng/mL (ref 0.00–5.00)

## 2023-08-01 LAB — POCT I-STAT CREATININE: Creatinine, Ser: 0.8 mg/dL (ref 0.44–1.00)

## 2023-08-01 MED ORDER — ALTEPLASE 2 MG IJ SOLR
2.0000 mg | Freq: Once | INTRAMUSCULAR | Status: AC
  Administered 2023-08-01: 2 mg
  Filled 2023-08-01: qty 2

## 2023-08-01 MED ORDER — HEPARIN SOD (PORK) LOCK FLUSH 100 UNIT/ML IV SOLN
500.0000 [IU] | Freq: Once | INTRAVENOUS | Status: AC
  Administered 2023-08-01: 500 [IU] via INTRAVENOUS

## 2023-08-01 MED ORDER — IOHEXOL 300 MG/ML  SOLN
100.0000 mL | Freq: Once | INTRAMUSCULAR | Status: AC | PRN
  Administered 2023-08-01: 75 mL via INTRAVENOUS

## 2023-08-01 MED ORDER — SODIUM CHLORIDE 0.9% FLUSH
10.0000 mL | Freq: Once | INTRAVENOUS | Status: AC
  Administered 2023-08-01: 10 mL via INTRAVENOUS

## 2023-08-01 NOTE — Progress Notes (Signed)
Blood return was achieved. Port was flushed and deaccessed per protocol (see flowsheet).

## 2023-08-01 NOTE — Patient Instructions (Signed)

## 2023-08-01 NOTE — Progress Notes (Signed)
[facility] OFFICE PROGRESS NOTE   Diagnosis: Colon cancer  INTERVAL HISTORY:   Patricia Hudson returns as scheduled.  She feels well.  No complaint.  A Port-A-Cath and abdominal infusion pump remain in place.  Objective:  Vital signs in last 24 hours:  Blood pressure 129/88, pulse 90, temperature 98.2 F (36.8 C), temperature source Oral, resp. rate 18, height 5\' 8"  (1.727 m), weight 140 lb (63.5 kg), last menstrual period 04/20/2013, SpO2 100%.    HEENT: Neck without mass Lymphatics: No cervical, supraclavicular, or inguinal nodes.  "Shotty "right axillary node. Resp: Lungs clear bilaterally Cardio: Regular rate and rhythm GI: No hepatosplenomegaly, left abdominal infusion pump Vascular: No leg edema    Portacath/PICC-without erythema  Lab Results:  Lab Results  Component Value Date   WBC 4.4 08/01/2023   HGB 13.9 08/01/2023   HCT 41.5 08/01/2023   MCV 93.3 08/01/2023   PLT 173 08/01/2023   NEUTROABS 2.4 08/01/2023    CMP  Lab Results  Component Value Date   NA 140 08/01/2023   K 4.7 08/01/2023   CL 107 08/01/2023   CO2 26 08/01/2023   GLUCOSE 99 08/01/2023   BUN 10 08/01/2023   CREATININE 0.80 08/01/2023   CALCIUM 9.1 08/01/2023   PROT 7.2 08/18/2021   ALBUMIN 4.7 08/18/2021   AST 19 08/18/2021   ALT 12 08/18/2021   ALKPHOS 99 08/18/2021   BILITOT 0.5 08/18/2021   GFRNONAA >60 08/01/2023   GFRAA >60 01/27/2020    Lab Results  Component Value Date   CEA1 <1.00 08/18/2021   CEA <1.00 08/01/2023    Imaging:  CT CHEST ABDOMEN PELVIS W CONTRAST  Result Date: 08/01/2023 CLINICAL DATA:  History of metastatic colon cancer, follow-up. * Tracking Code: BO *. EXAM: CT CHEST, ABDOMEN, AND PELVIS WITH CONTRAST TECHNIQUE: Multidetector CT imaging of the chest, abdomen and pelvis was performed following the standard protocol during bolus administration of intravenous contrast. RADIATION DOSE REDUCTION: This exam was performed according to the  departmental dose-optimization program which includes automated exposure control, adjustment of the mA and/or kV according to patient size and/or use of iterative reconstruction technique. CONTRAST:  75mL OMNIPAQUE IOHEXOL 300 MG/ML  SOLN COMPARISON:  Multiple priors including most recent CT January 30, 2023 FINDINGS: CT CHEST FINDINGS Cardiovascular: Accessed right chest Port-A-Cath with tip in the right atrium. Normal caliber thoracic aorta. No central pulmonary embolus on this nondedicated study. Normal size heart. No significant pericardial effusion/thickening. Mediastinum/Nodes: No suspicious thyroid nodule. No pathologically enlarged mediastinal, hilar or axillary lymph nodes. The esophagus is grossly unremarkable. Lungs/Pleura: Biapical pleuroparenchymal scarring. Solid 3 mm nodule in the right lung apex on image 24/4 is unchanged. There are few new scattered pulmonary nodules.  For reference: -Solid 6 mm subpleural pulmonary nodule on image 97/4 -solid 2 mm left lower lobe pulmonary nodule on image 100/4 -solid 2 mm right middle lobe pulmonary nodule on image 84/4. Musculoskeletal: No aggressive lytic or blastic lesion of bone. CT ABDOMEN PELVIS FINDINGS Hepatobiliary: Similar postsurgical change of partial left and right hepatectomy. No new suspicious enhancing nodularity. Stable hypodense bilobar hepatic lesions previously characterized as cysts. No new suspicious hepatic lesions. Stable position of the hepatic arterial infusion pump catheter which terminates in the area of the gastroduodenal artery. Patent portal, splenic and superior mesenteric veins. Gallbladder surgically absent.  No biliary ductal dilation. Pancreas: No pancreatic ductal dilation or evidence of acute inflammation. Spleen: No splenomegaly. Adrenals/Urinary Tract: Bilateral adrenal glands appear normal. No hydronephrosis. 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 Kidneys demonstrate symmetric enhancement. Urinary bladder is  unremarkable for degree of distension. Stomach/Bowel: No radiopaque enteric contrast material was administered. Stomach is unremarkable for degree of distension. No pathologic dilation of small or large bowel. Prior partial sigmoidectomy with rectosigmoid anastomotic sutures in the midline pelvis on image 112/2. No new suspicious nodularity along the suture line. Vascular/Lymphatic: Normal caliber abdominal aorta. Smooth IVC contours. Aortic atherosclerosis. No pathologically enlarged abdominal or pelvic lymph nodes. Reproductive: Uterus and bilateral adnexa are unremarkable. Other: No significant abdominopelvic free fluid. Infusion pump in the anterior left lower quadrant abdominal wall. Musculoskeletal: No aggressive lytic or blastic lesion of bone. Mixed lesion in the left iliac bone is stable dating back to CT July 07, 2016 compatible with a benign finding. IMPRESSION: 1. A few new scattered pulmonary nodules measuring up to 6 mm, indeterminate suggest attention on short-term interval follow-up dedicated chest CT. 2. No evidence of metastatic disease in the abdomen or pelvis. 3. Stable postsurgical change of partial sigmoidectomy and hepatectomy without evidence of local recurrence. 4.  Aortic Atherosclerosis (ICD10-I70.0). Electronically Signed   By: Maudry Mayhew M.D.   On: 08/01/2023 10:38    Medications: I have reviewed the patient's current medications.   Assessment/Plan: Adenocarcinoma of the sigmoid colon, status post a colonoscopic biopsy 07/07/2016 Mismatch repair protein expression-normal Sigmoid mass noted at 20 cm from the anal verge Staging CTs of the chest, abdomen, and pelvis negative for evidence of metastatic disease other than an indeterminate 10 mm right liver lesion MRI of the abdomen 07/07/2016 revealed multiple bilateral hepatic cyst. 7 mm inferior subcapsular right liver lesion suspicious for a metastasis PET scan 07/21/2016-hypermetabolic sigmoid colon mass, mildly  hypermetabolic segment 6 liver lesion Laparoscopic assisted rectosigmoid colectomy and segment 8 liver resection 07/27/2016, pathology confirmed a T3, N2b, M1 tumor, 11/16 lymph nodes positive for metastatic carcinoma Foundation 1 testing found the tumor to be microsatellite stable, tumor mutation burden low, no BRAF or RAS mutation No loss of mismatch repair protein expression, MSI-stable Cycle 1 CAPOX 08/22/2016 Cycle 2 CAPOX 09/12/2016 Cycle 3 CAPOX 10/03/2016 (Xeloda dose reduced to 3 tablets every morning and 2 tablets every evening beginning with cycle 3) Cycle 4 CAPOX 10/31/2016 (Xeloda re-escalated to original dose) Cycle 5 CAPOX 11/21/2016 Cycle 6 CAPOX 12/12/2016 Cycle 7 CAPOX 01/09/2017 (oxaliplatin held) Cycle 8 CAPOX 01/30/2017 (oxaliplatin dose reduced) CT scans 02/27/2017-negative for recurrent colon cancer CTs 08/14/2017-negative for recurrent cancer CTs 02/12/2018-negative for recurrent cancer CTs 07/29/2018-negative for recurrent cancer  CTs 01/27/2019-negative for recurrent cancer, new 8 mm subpleural left lower lobe nodule CT chest 04/29/2019- resolution of left lower lobe nodule CT 07/29/2019- negative for recurrent cancer CTs 01/27/2020-stable 4 mm right apical lung nodule.  New 1.6 x 1.6 cm lesion identified in the lateral segment left liver.  Stable 9 mm subcapsular lesion interpolar left kidney. MRI liver 3/10/ 2021-15 mm enhancing lesion in segment 3, new from September 2020, subcentimeter hemorrhagic cyst in the left kidney Robotic assisted partial hepatectomy (segment 3), cholecystectomy, portal lymphadenectomy, and insertion of hepatic infusion pump 02/12/2020, 0/3 nodes, 1.9 cm adenocarcinoma in segment 3, negative margins 03/11/2020-FUDR and infusional 5-FU #1 04/08/2020-FUDR #2 05/06/2020-FUDR#3 06/03/2020-FUDR#4 canceled secondary to elevation of the alkaline phosphatase 07/15/2020-FUDR#4, 50% dose reduction 09/09/2000-CTs chest/abdomen/pelvis-negative 12/02/2020- CTs  negative for recurrent disease 02/21/2021-CTs negative for recurrent disease, stable surgical changes in the liver CTs 08/18/2021-no evidence of recurrent disease, stable postoperative findings of partial hepatectomy CTs 02/21/2022-no evidence of recurrent disease CTs 07/25/2022-no evidence of recurrent  disease CTs 01/30/2023-no evidence of recurrent disease CTs 08/01/2023-no evidence of metastatic disease in the abdomen or pelvis, few new pulmonary nodules   2.   History of rectal bleeding secondary to #1   3.   Multiple polyps on the colonoscopy 07/07/2016 Admission 07/13/2016 with  acute GI bleeding secondary to an ulcer at the cecum Colonoscopy 10/08/2017-small polyps removed from the cecum-colonic mucosa with lymphoid aggregates-no dysplasia or malignancy   4.   Family history of breast and pancreas cancer   5.  Delayed nausea following cycle 1 CAPOX-emend added with cycle 2   6. Diarrhea following cycle 2 and cycle 6 CAPOX   7.  Neutropenia/thrombocytopenia secondary to chemotherapy   8.  History of mild oxaliplatin neuropathy- resolved        Disposition: Patricia Hudson appears stable.  I reviewed the restaging CT findings and images with her.  There are a few tiny pulmonary nodules.  The 6 mm subpleural nodule appears to been present on a CT in 2020.  I will discuss the case with radiology to decide on the indication for a short follow-up CT chest.  She will be scheduled for an office visit and surveillance CTs in March 2025.  She continues an every 4-week flush of the abdominal infusion pump and every 8-week flush of the Port-A-Cath.  Thornton Papas, MD  08/01/2023  11:45 AM Addendum: I reviewed the CT images with a radiologist.  Dr. Ova Freshwater agrees the 6 mm left subpleural nodule was present in the past.  The tiny 2 mm nodules described in the report are most likely inflammatory and benign.  The plan is to proceed with surveillance imaging at 6 months as previously scheduled.

## 2023-08-01 NOTE — Progress Notes (Signed)
Port a cath did not give adequate blood return when flushed.  Labs were drawn peripherally and patient informed that a PIV will need to be started for her scan.  Cathflo was administered in port a cath per protocol.  Blood return will be rechecked when patient returns to office later this morning for MD visit.  Radiology tech informed patient will need PIV started for scan and to leave port accessed but do not use.  Patient and Radiology Tech both verbalized understanding.

## 2023-08-08 ENCOUNTER — Ambulatory Visit: Payer: BC Managed Care – PPO

## 2023-08-23 ENCOUNTER — Inpatient Hospital Stay: Payer: BC Managed Care – PPO | Attending: Oncology

## 2023-08-23 VITALS — BP 113/90 | HR 76 | Temp 98.0°F | Resp 18 | Ht 68.0 in | Wt 138.4 lb

## 2023-08-23 DIAGNOSIS — Z85038 Personal history of other malignant neoplasm of large intestine: Secondary | ICD-10-CM | POA: Diagnosis present

## 2023-08-23 DIAGNOSIS — Z95828 Presence of other vascular implants and grafts: Secondary | ICD-10-CM

## 2023-08-23 DIAGNOSIS — C187 Malignant neoplasm of sigmoid colon: Secondary | ICD-10-CM

## 2023-08-23 MED ORDER — SODIUM CHLORIDE (PF) 0.9 % IJ SOLN
Freq: Once | INTRAMUSCULAR | Status: AC
  Filled 2023-08-23: qty 5

## 2023-08-23 NOTE — Progress Notes (Signed)
Patient will return on 09/25/2023 for her next refill. Tolerated procedure well.

## 2023-08-23 NOTE — Patient Instructions (Signed)

## 2023-08-23 NOTE — Progress Notes (Addendum)
  Accessed Medtronic Pump LUQ abdomen using sterile technique without difficulty. Aspirated 3.0 ml (Ipad noted 2.0 ml). Flushed with 20 ml NS with 25,000 units heparin aspirating every 3 ml. Needle removed and bandaid applied. Patient will return on 09/25/2023 for her next refill. Tolerated procedure well.

## 2023-08-24 ENCOUNTER — Inpatient Hospital Stay: Payer: BC Managed Care – PPO

## 2023-08-29 ENCOUNTER — Ambulatory Visit: Payer: BC Managed Care – PPO

## 2023-08-31 ENCOUNTER — Inpatient Hospital Stay: Payer: BC Managed Care – PPO

## 2023-09-05 ENCOUNTER — Ambulatory Visit: Payer: BC Managed Care – PPO

## 2023-09-18 ENCOUNTER — Telehealth: Payer: Self-pay

## 2023-09-18 NOTE — Telephone Encounter (Signed)
Telephone call to patient regarding her Medtronic pump refill. Agreed with patient to reschedule appointment to Monday 09/24/2023 @ 0830. Patient is aware and agreeable.

## 2023-09-24 ENCOUNTER — Inpatient Hospital Stay: Payer: BC Managed Care – PPO | Attending: Oncology

## 2023-09-24 VITALS — BP 103/82 | HR 85 | Temp 97.8°F | Resp 18 | Ht 68.0 in | Wt 142.2 lb

## 2023-09-24 DIAGNOSIS — Z85038 Personal history of other malignant neoplasm of large intestine: Secondary | ICD-10-CM | POA: Insufficient documentation

## 2023-09-24 DIAGNOSIS — Z95828 Presence of other vascular implants and grafts: Secondary | ICD-10-CM

## 2023-09-24 DIAGNOSIS — R918 Other nonspecific abnormal finding of lung field: Secondary | ICD-10-CM | POA: Diagnosis not present

## 2023-09-24 DIAGNOSIS — C187 Malignant neoplasm of sigmoid colon: Secondary | ICD-10-CM

## 2023-09-24 MED ORDER — HEPARIN SODIUM (PORCINE) 5000 UNIT/ML IJ SOLN
Freq: Once | INTRAMUSCULAR | Status: AC
  Filled 2023-09-24: qty 5

## 2023-09-24 MED ORDER — HEPARIN SOD (PORK) LOCK FLUSH 100 UNIT/ML IV SOLN
500.0000 [IU] | Freq: Once | INTRAVENOUS | Status: AC | PRN
Start: 2023-09-24 — End: 2023-09-24
  Administered 2023-09-24: 500 [IU] via INTRAVENOUS

## 2023-09-24 MED ORDER — SODIUM CHLORIDE 0.9% FLUSH
10.0000 mL | INTRAVENOUS | Status: DC | PRN
  Administered 2023-09-24: 10 mL via INTRAVENOUS

## 2023-09-24 NOTE — Progress Notes (Signed)
Accessed Medtronic Pump LUQ abdomen using sterile technique without difficulty. Aspirated 3.0 ml (Ipad noted 0.8 ml). Flushed with 20 ml NS with 25,000 units heparin aspirating every 3 ml. Needle removed and bandaid applied. Patient will return on 10/26/2023 for her next refill, Ipad recommends refilling pump before 12/07. Patient tolerated procedure well.

## 2023-09-24 NOTE — Patient Instructions (Signed)

## 2023-09-25 ENCOUNTER — Inpatient Hospital Stay: Payer: BC Managed Care – PPO

## 2023-09-26 ENCOUNTER — Ambulatory Visit: Payer: BC Managed Care – PPO

## 2023-10-01 ENCOUNTER — Inpatient Hospital Stay: Payer: BC Managed Care – PPO

## 2023-10-04 ENCOUNTER — Ambulatory Visit: Payer: BC Managed Care – PPO

## 2023-10-26 ENCOUNTER — Inpatient Hospital Stay: Payer: BC Managed Care – PPO | Attending: Oncology

## 2023-10-26 VITALS — BP 121/89 | HR 76 | Temp 97.9°F | Resp 18 | Ht 68.0 in | Wt 143.3 lb

## 2023-10-26 DIAGNOSIS — Z85038 Personal history of other malignant neoplasm of large intestine: Secondary | ICD-10-CM | POA: Diagnosis present

## 2023-10-26 DIAGNOSIS — Z95828 Presence of other vascular implants and grafts: Secondary | ICD-10-CM

## 2023-10-26 DIAGNOSIS — C187 Malignant neoplasm of sigmoid colon: Secondary | ICD-10-CM

## 2023-10-26 MED ORDER — SODIUM CHLORIDE (PF) 0.9 % IJ SOLN
Freq: Once | INTRAMUSCULAR | Status: AC
  Filled 2023-10-26: qty 5

## 2023-10-26 NOTE — Progress Notes (Signed)
  Accessed Medtronic Pump LUQ abdomen using sterile technique without difficulty. Aspirated 2.0 ml (Ipad noted 0.8 ml). Flushed with 20 ml NS with 25,000 units heparin aspirating every 3 ml. Needle removed and bandaid applied. Patient will return on 11/27/2023 for her next refill, Ipad recommends refilling pump by 11/28/2023. Patient tolerated procedure well.

## 2023-10-26 NOTE — Patient Instructions (Signed)

## 2023-10-31 ENCOUNTER — Inpatient Hospital Stay: Payer: BC Managed Care – PPO

## 2023-11-27 ENCOUNTER — Inpatient Hospital Stay: Payer: BC Managed Care – PPO | Attending: Oncology

## 2023-11-27 VITALS — BP 120/84 | HR 84 | Temp 98.1°F | Resp 20 | Ht 68.0 in | Wt 143.6 lb

## 2023-11-27 DIAGNOSIS — Z95828 Presence of other vascular implants and grafts: Secondary | ICD-10-CM

## 2023-11-27 DIAGNOSIS — Z85038 Personal history of other malignant neoplasm of large intestine: Secondary | ICD-10-CM | POA: Diagnosis present

## 2023-11-27 DIAGNOSIS — C187 Malignant neoplasm of sigmoid colon: Secondary | ICD-10-CM

## 2023-11-27 MED ORDER — HEPARIN SOD (PORK) LOCK FLUSH 100 UNIT/ML IV SOLN
500.0000 [IU] | Freq: Once | INTRAVENOUS | Status: AC | PRN
  Administered 2023-11-27: 500 [IU] via INTRAVENOUS

## 2023-11-27 MED ORDER — SODIUM CHLORIDE 0.9% FLUSH
10.0000 mL | INTRAVENOUS | Status: DC | PRN
  Administered 2023-11-27: 10 mL via INTRAVENOUS

## 2023-11-27 MED ORDER — HEPARIN SODIUM (PORCINE) 5000 UNIT/ML IJ SOLN
Freq: Once | INTRAMUSCULAR | Status: AC
  Filled 2023-11-27: qty 5

## 2023-11-27 NOTE — Progress Notes (Signed)
 Subjective:    Patient ID:  Patricia Hudson is a 58 y.o. female  Chief Complaint: Chief Complaint  Patient presents with  . Sore Throat    Pt states she is here for sore throat and it is painful to swallow.X14 DAYS     HPI  History is provided by the patient.  Patient presents with complaints of a sore throat x 2 weeks.  Patient states that throat is more sore at night when she goes to bed.  Patient has not taken any OTC medications for symptoms.  Reports she wanted to be sure she did not have strep throat and could possibly be spreading it to others.  Denies history of frequent strep throat infections.  Denies known exposure to any other specific illness.  Denies fever, body aches, fatigue, nausea, vomiting, diarrhea, GERD, difficulty swallowing, cough, chest pain, back pain, abdominal pain.   Past medical, surgical and family history reviewed.    Social History   Tobacco Use: Low Risk  (11/27/2023)   Patient History   . Smoking Tobacco Use: Never   . Smokeless Tobacco Use: Never   . Passive Exposure: Not on file      Review of Systems  Constitutional:  Negative for chills, fever and malaise/fatigue.  HENT:  Positive for sore throat. Negative for congestion, ear pain and sinus pain.   Eyes:  Negative for double vision, pain, discharge and redness.  Respiratory:  Negative for cough, sputum production, shortness of breath and wheezing.   Cardiovascular:  Negative for chest pain.  Gastrointestinal:  Negative for abdominal pain, diarrhea, nausea and vomiting.  Musculoskeletal:  Negative for back pain and myalgias.  Skin:  Negative for rash.  Neurological:  Negative for dizziness, weakness and headaches.     Objective:   Vitals:   11/27/23 1311  BP: 112/79  BP Location: Left Upper Arm  Patient Position: Sitting  Pulse: 93  Resp: 18  Temp: 98.5 F (36.9 C)  TempSrc: Oral  SpO2: 97%  Weight: 114 lb 12.8 oz (52.1 kg)  Height: 5' 7.5 (1.715 m)     Physical  Exam Vitals and nursing note reviewed.  Constitutional:      General: She is not in acute distress.    Appearance: Normal appearance. She is not ill-appearing, toxic-appearing or diaphoretic.  HENT:     Head: Normocephalic and atraumatic.     Right Ear: Tympanic membrane, ear canal and external ear normal.     Left Ear: Tympanic membrane, ear canal and external ear normal.     Nose: Nose normal. No rhinorrhea.     Mouth/Throat:     Mouth: Mucous membranes are moist.     Pharynx: Oropharynx is clear. No oropharyngeal exudate or posterior oropharyngeal erythema.  Eyes:     General:        Right eye: No discharge.        Left eye: No discharge.     Extraocular Movements: Extraocular movements intact.     Conjunctiva/sclera: Conjunctivae normal.  Cardiovascular:     Rate and Rhythm: Normal rate and regular rhythm.     Pulses: Normal pulses.     Heart sounds: Normal heart sounds.  Pulmonary:     Effort: Pulmonary effort is normal.     Breath sounds: Normal breath sounds.  Musculoskeletal:     Cervical back: Normal range of motion.  Lymphadenopathy:     Cervical: No cervical adenopathy.  Skin:    General: Skin is warm and dry.  Findings: No lesion or rash.  Neurological:     General: No focal deficit present.     Mental Status: She is alert and oriented to person, place, and time.  Psychiatric:        Mood and Affect: Mood normal.        Behavior: Behavior normal.       Assessment:   Results of tests available at time of visit: Recent Results (from the past 24 hour(s))  POCT Strep Nucleic Acid   Collection Time: 11/27/23  1:32 PM  Result Value Ref Range   Strep Negative Negative       Plan:   1. Sore throat     Orders Placed This Encounter  Procedures  . POCT Strep Nucleic Acid    Patient's Medications   No medications on file      MDM:  Patricia Hudson is a 58 y.o. female with a sore throat that is most likely due to drainage or possible allergen  causing symptoms.  Advised patient to attempt OTC allergy medications to see if that helps alleviate discomfort.  Patient declined Rx for viscous lidocaine  stating that her throat was not that painful but just irritating from time to time.  Physical exam WNL.  No indication for antibiotics.  Advised of red flags and when to go to the ED.  Patient verbalized agreement and understanding.   Complexity No additional elements Considered antibiotics and discussed stewardship with patient due to etiology (Rx equivalent)   Comorbidities No comorbidities noted.    Problems Acute uncomplicated illness/injury (3)  Data Independent historian only (3)  Risk OTC Meds (3)   Previsit planning was completed via snapshot and review of chart.  I reviewed the patient instructions on the AVS with the patient/family verbalized to me that they understood what their problem is, what they need to do about it, and why it is important that they do it.  The patient/family voices understanding of all medications. No barriers to adherence were noted. Patient is taking all medications as prescribed and is tolerating well.  Plan for follow-up as discussed or as needed if any worsening symptoms or change in condition.  After visit summary discussed with patient.      The patient indicates understanding of these issues and agrees with the plan. Patricia GORMAN Counter, PA-C    Text above is via Dragon naturally speaking voice recognition transcription. - there may be typographical errors.  Patient Instructions  You may want to try an allergy medication such as Zyrtec or Benadryl  to see if that helps alleviate sore throat.  Take as directed. If symptoms persist, follow-up with PCP for further evaluation. If you develop a fever, have severe pain, have tonsil swelling, or have difficulty swallowing, go to the ED for immediate evaluation. Contact our clinic with any questions or concerns.  tsc

## 2023-11-27 NOTE — Progress Notes (Signed)
 Accessed Medtronic Pump LUQ abdomen using sterile technique without difficulty. Aspirated 3.0 ml (Ipad noted 0.8 ml). Flushed with 20 ml NS with 25,000 units heparin  aspirating every 3 ml. Needle removed and bandaid applied. Patient will return on 11/26/2023 for her next refill, Ipad recommends refilling pump by 11/29/2023. Patient tolerated procedure well.

## 2023-11-27 NOTE — Patient Instructions (Signed)

## 2023-12-27 ENCOUNTER — Inpatient Hospital Stay: Payer: BC Managed Care – PPO | Attending: Oncology

## 2023-12-27 VITALS — BP 102/85 | HR 83 | Temp 98.0°F | Resp 18 | Ht 68.0 in | Wt 143.3 lb

## 2023-12-27 DIAGNOSIS — Z85038 Personal history of other malignant neoplasm of large intestine: Secondary | ICD-10-CM | POA: Insufficient documentation

## 2023-12-27 DIAGNOSIS — C187 Malignant neoplasm of sigmoid colon: Secondary | ICD-10-CM

## 2023-12-27 DIAGNOSIS — Z95828 Presence of other vascular implants and grafts: Secondary | ICD-10-CM

## 2023-12-27 MED ORDER — SODIUM CHLORIDE (PF) 0.9 % IJ SOLN
Freq: Once | INTRAMUSCULAR | Status: AC
  Filled 2023-12-27: qty 5

## 2023-12-27 NOTE — Progress Notes (Signed)
 Accessed Medtronic Pump LUQ abdomen using sterile technique without difficulty. Aspirated 3.0 ml (Ipad noted 2.0 ml). Flushed with 20 ml NS with 25,000 units heparin  aspirating every 3 ml. Needle removed and bandaid applied. Patient will return on 01/28/2024 for her next refill, Ipad recommends refilling pump by 01/29/2024. Patient tolerated procedure well.

## 2023-12-27 NOTE — Patient Instructions (Signed)

## 2024-01-02 ENCOUNTER — Encounter: Payer: Self-pay | Admitting: Oncology

## 2024-01-02 NOTE — Progress Notes (Signed)
err

## 2024-01-15 ENCOUNTER — Encounter: Payer: Self-pay | Admitting: *Deleted

## 2024-01-15 NOTE — Progress Notes (Signed)
 Per managed care, her insurance will not allow scans to be at [facility] now. Sent list of approved facilities to patient via MyChart with request for her to choose her preference.

## 2024-01-23 ENCOUNTER — Inpatient Hospital Stay: Payer: BC Managed Care – PPO | Admitting: Oncology

## 2024-01-23 ENCOUNTER — Encounter: Payer: Self-pay | Admitting: Nurse Practitioner

## 2024-01-23 ENCOUNTER — Ambulatory Visit (HOSPITAL_BASED_OUTPATIENT_CLINIC_OR_DEPARTMENT_OTHER): Payer: BC Managed Care – PPO

## 2024-01-23 ENCOUNTER — Ambulatory Visit
Admission: RE | Admit: 2024-01-23 | Discharge: 2024-01-23 | Disposition: A | Discharge status: Home / Self Care | Payer: BC Managed Care – PPO | Source: Ambulatory Visit | Attending: Oncology | Admitting: Oncology

## 2024-01-23 ENCOUNTER — Inpatient Hospital Stay: Payer: BC Managed Care – PPO | Attending: Oncology | Admitting: Nurse Practitioner

## 2024-01-23 DIAGNOSIS — D6959 Other secondary thrombocytopenia: Secondary | ICD-10-CM | POA: Diagnosis not present

## 2024-01-23 DIAGNOSIS — Z1231 Encounter for screening mammogram for malignant neoplasm of breast: Secondary | ICD-10-CM | POA: Diagnosis not present

## 2024-01-23 DIAGNOSIS — Z860100 Personal history of colon polyps, unspecified: Secondary | ICD-10-CM | POA: Diagnosis not present

## 2024-01-23 DIAGNOSIS — Z800 Family history of malignant neoplasm of digestive organs: Secondary | ICD-10-CM | POA: Insufficient documentation

## 2024-01-23 DIAGNOSIS — Z85038 Personal history of other malignant neoplasm of large intestine: Secondary | ICD-10-CM | POA: Diagnosis present

## 2024-01-23 DIAGNOSIS — C187 Malignant neoplasm of sigmoid colon: Secondary | ICD-10-CM | POA: Diagnosis not present

## 2024-01-23 DIAGNOSIS — Z9221 Personal history of antineoplastic chemotherapy: Secondary | ICD-10-CM | POA: Diagnosis not present

## 2024-01-23 DIAGNOSIS — Z803 Family history of malignant neoplasm of breast: Secondary | ICD-10-CM | POA: Insufficient documentation

## 2024-01-23 MED ORDER — HEPARIN SOD (PORK) LOCK FLUSH 100 UNIT/ML IV SOLN
500.0000 [IU] | Freq: Once | INTRAVENOUS | Status: AC
  Administered 2024-01-23: 500 [IU] via INTRAVENOUS

## 2024-01-23 MED ORDER — SODIUM CHLORIDE 0.9% FLUSH
10.0000 mL | INTRAVENOUS | Status: DC | PRN
  Administered 2024-01-23: 10 mL via INTRAVENOUS

## 2024-01-23 MED ORDER — IOPAMIDOL (ISOVUE-300) INJECTION 61%
100.0000 mL | Freq: Once | INTRAVENOUS | Status: AC | PRN
  Administered 2024-01-23: 100 mL via INTRAVENOUS

## 2024-01-23 NOTE — Progress Notes (Addendum)
 Patient Care Team: Pcp, No as PCP - General Magrinat, Valentino Hue, MD (Inactive) as Consulting Physician (Oncology) Romualdo Bolk, MD (Inactive) as Consulting Physician (Obstetrics and Gynecology) Ladene Artist, MD as Consulting Physician (Oncology) Almond Lint, MD as Consulting Physician (General Surgery) Teryl Lucy, MD as Consulting Physician (Orthopedic Surgery)   CHIEF COMPLAINT: History of colon cancer  CURRENT THERAPY: Surveillance  INTERVAL HISTORY [name] returns for surveillance visit as scheduled, last seen by Dr. Truett Perna 08/01/2023.  Doing well in the interim with no significant changes in her health.  Good energy and appetite. Denies unintentional weight loss, change in bowel habits, rectal bleeding, abdominal pain/bloating, or any other new or specific complaints.  ROS  All other systems reviewed and negative  Past Medical History:  Diagnosis Date   Cancer (HCC) colon cancer 2017   liver mass   Closed displaced fracture of fifth metatarsal bone of right foot 06/28/2017   Fracture of fourth metatarsal bone 06/28/2017   Fracture of third metatarsal bone 06/28/2017   Renal calculi 1994     Past Surgical History:  Procedure Laterality Date   COLONOSCOPY     COLONOSCOPY N/A 07/14/2016   Procedure: COLONOSCOPY;  Surgeon: Jeani Hawking, MD;  Location: [facility] ENDOSCOPY;  Service: Endoscopy;  Laterality: N/A;   LAPAROSCOPIC LIVER CYST REMOVAL N/A 07/27/2016   Procedure: REMOVAL OF LIVER MASS;  Surgeon: Avel Peace, MD;  Location: [facility] ORS;  Service: General;  Laterality: N/A;   LAPAROSCOPIC PARTIAL COLECTOMY N/A 07/27/2016   Procedure: LAPAROSCOPIC PARTIAL COLECTOMY MOBILIZATION OF SPLENIC FLEXURE LAPAROSCOPIC SEGMENTAL LIVER RESECTION OF SEGMENT 8 OF LIVER;  Surgeon: Avel Peace, MD;  Location: [facility] ORS;  Service: General;  Laterality: N/A;   PERCUTANEOUS PINNING Right 06/28/2017   Procedure: RIGHT FOOT 3RD, 4TH, 5TH METATARSAL (TOE) FRACTURE;  Surgeon: Teryl Lucy, MD;  Location: [facility];  Service: Orthopedics;  Laterality: Right;   PORT-A-CATH REMOVAL N/A 08/14/2018   Procedure: REMOVAL PORT-A-CATH ERAS PATHWAY;  Surgeon: Almond Lint, MD;  Location: [facility] ORS;  Service: General;  Laterality: N/A;   PORTACATH PLACEMENT Right 08/17/2016   Procedure: ULTRASOUND GUIDED INSERTION PORT-A-CATH RIGHT INTERNAL JUGULAR;  Surgeon: Avel Peace, MD;  Location: [facility] OR;  Service: General;  Laterality: Right;   TONSILLECTOMY AND ADENOIDECTOMY  1970's     Outpatient Encounter Medications as of 01/23/2024  Medication Sig   lidocaine-prilocaine (EMLA) cream APPLY TOPICALLY ONCE FOR 1 DOSE APPLY TO PORT SITE ONE PRIOR TO BEING ACCESSED   Facility-Administered Encounter Medications as of 01/23/2024  Medication   [COMPLETED] heparin lock flush 100 unit/mL   sodium chloride flush (NS) 0.9 % injection 10 mL     Today's Vitals   01/23/24 1136  PainSc: 0-No pain   There is no height or weight on file to calculate BMI.   PHYSICAL EXAM GENERAL:alert, no distress and comfortable SKIN: no rash  EYES: sclera clear NECK: without mass LYMPH:  no palpable cervical or supraclavicular lymphadenopathy  LUNGS: clear with normal breathing effort HEART: regular rate & rhythm, no lower extremity edema ABDOMEN: abdomen soft, non-tender and normal bowel sounds.  Hepatic artery pump to the left abdomen NEURO: alert & oriented x 3 with fluent speech, no focal motor/sensory deficits PAC without erythema    CBC    Component Value Date/Time   WBC 4.4 08/01/2023 0943   WBC 3.9 (L) 08/14/2018 0705   RBC 4.45 08/01/2023 0943   HGB 13.9 08/01/2023 0943   HGB 14.2 07/04/2018 0953  HGB 14.0 04/10/2017 1126   HCT 41.5 08/01/2023 0943   HCT 43.1 07/04/2018 0953   HCT 41.1 04/10/2017 1126   PLT 173 08/01/2023 0943   PLT 175 07/04/2018 0953   MCV 93.3 08/01/2023 0943   MCV 95 07/04/2018 0953   MCV 94.5 04/10/2017 1126   MCH 31.2 08/01/2023 0943   MCHC 33.5  08/01/2023 0943   RDW 12.6 08/01/2023 0943   RDW 13.8 07/04/2018 0953   RDW 13.0 04/10/2017 1126   LYMPHSABS 1.1 08/01/2023 0943   LYMPHSABS 1.6 04/10/2017 1126   MONOABS 0.5 08/01/2023 0943   MONOABS 0.5 04/10/2017 1126   EOSABS 0.3 08/01/2023 0943   EOSABS 0.1 04/10/2017 1126   BASOSABS 0.0 08/01/2023 0943   BASOSABS 0.0 04/10/2017 1126     CMP     Component Value Date/Time   NA 140 08/01/2023 0943   NA 140 08/13/2017 1541   K 4.7 08/01/2023 0943   K 4.1 08/13/2017 1541   CL 107 08/01/2023 0943   CO2 26 08/01/2023 0943   CO2 24 08/13/2017 1541   GLUCOSE 99 08/01/2023 0943   GLUCOSE 95 08/13/2017 1541   BUN 10 08/01/2023 0943   BUN 17.4 08/13/2017 1541   CREATININE 0.80 08/01/2023 1005   CREATININE 0.81 08/01/2023 0943   CREATININE 0.8 08/13/2017 1541   CALCIUM 9.1 08/01/2023 0943   CALCIUM 9.8 08/13/2017 1541   PROT 7.2 08/18/2021 0742   PROT 6.5 01/30/2017 0952   ALBUMIN 4.7 08/18/2021 0742   ALBUMIN 3.7 01/30/2017 0952   AST 19 08/18/2021 0742   AST 27 01/30/2017 0952   ALT 12 08/18/2021 0742   ALT 16 01/30/2017 0952   ALKPHOS 99 08/18/2021 0742   ALKPHOS 126 01/30/2017 0952   BILITOT 0.5 08/18/2021 0742   BILITOT 0.41 01/30/2017 0952   GFRNONAA >60 08/01/2023 0943   GFRAA >60 01/27/2020 0815     ASSESSMENT & PLAN: Adenocarcinoma of the sigmoid colon, status post a colonoscopic biopsy 07/07/2016 Mismatch repair protein expression-normal Sigmoid mass noted at 20 cm from the anal verge Staging CTs of the chest, abdomen, and pelvis negative for evidence of metastatic disease other than an indeterminate 10 mm right liver lesion MRI of the abdomen 07/07/2016 revealed multiple bilateral hepatic cyst. 7 mm inferior subcapsular right liver lesion suspicious for a metastasis PET scan 07/21/2016-hypermetabolic sigmoid colon mass, mildly hypermetabolic segment 6 liver lesion Laparoscopic assisted rectosigmoid colectomy and segment 8 liver resection 07/27/2016,  pathology confirmed a T3, N2b, M1 tumor, 11/16 lymph nodes positive for metastatic carcinoma Foundation 1 testing found the tumor to be microsatellite stable, tumor mutation burden low, no BRAF or RAS mutation No loss of mismatch repair protein expression, MSI-stable Cycle 1 CAPOX 08/22/2016 Cycle 2 CAPOX 09/12/2016 Cycle 3 CAPOX 10/03/2016 (Xeloda dose reduced to 3 tablets every morning and 2 tablets every evening beginning with cycle 3) Cycle 4 CAPOX 10/31/2016 (Xeloda re-escalated to original dose) Cycle 5 CAPOX 11/21/2016 Cycle 6 CAPOX 12/12/2016 Cycle 7 CAPOX 01/09/2017 (oxaliplatin held) Cycle 8 CAPOX 01/30/2017 (oxaliplatin dose reduced) CT scans 02/27/2017-negative for recurrent colon cancer CTs 08/14/2017-negative for recurrent cancer CTs 02/12/2018-negative for recurrent cancer CTs 07/29/2018-negative for recurrent cancer  CTs 01/27/2019-negative for recurrent cancer, new 8 mm subpleural left lower lobe nodule CT chest 04/29/2019- resolution of left lower lobe nodule CT 07/29/2019- negative for recurrent cancer CTs 01/27/2020-stable 4 mm right apical lung nodule.  New 1.6 x 1.6 cm lesion identified in the lateral segment left liver.  Stable 9 mm subcapsular  lesion interpolar left kidney. MRI liver 3/10/ 2021-15 mm enhancing lesion in segment 3, new from September 2020, subcentimeter hemorrhagic cyst in the left kidney Robotic assisted partial hepatectomy (segment 3), cholecystectomy, portal lymphadenectomy, and insertion of hepatic infusion pump 02/12/2020, 0/3 nodes, 1.9 cm adenocarcinoma in segment 3, negative margins 03/11/2020-FUDR and infusional 5-FU #1 04/08/2020-FUDR #2 05/06/2020-FUDR#3 06/03/2020-FUDR#4 canceled secondary to elevation of the alkaline phosphatase 07/15/2020-FUDR#4, 50% dose reduction 09/09/2000-CTs chest/abdomen/pelvis-negative 12/02/2020- CTs negative for recurrent disease 02/21/2021-CTs negative for recurrent disease, stable surgical changes in the liver CTs 08/18/2021-no  evidence of recurrent disease, stable postoperative findings of partial hepatectomy CTs 02/21/2022-no evidence of recurrent disease CTs 07/25/2022-no evidence of recurrent disease CTs 01/30/2023-no evidence of recurrent disease CTs 08/01/2023-no evidence of metastatic disease in the abdomen or pelvis, few new pulmonary nodules CTs 01/23/2024 - no evidence of recurrence, few pulmonary nodules from last CT 08/01/23 have resolved, supporting benign etiology    2.   History of rectal bleeding secondary to #1   3.   Multiple polyps on the colonoscopy 07/07/2016 Admission 07/13/2016 with  acute GI bleeding secondary to an ulcer at the cecum Colonoscopy 10/08/2017-small polyps removed from the cecum-colonic mucosa with lymphoid aggregates-no dysplasia or malignancy   4.   Family history of breast and pancreas cancer    5.  Delayed nausea following cycle 1 CAPOX-emend added with cycle 2   6. Diarrhea following cycle 2 and cycle 6 CAPOX   7.  Neutropenia/thrombocytopenia secondary to chemotherapy   8.  History of mild oxaliplatin neuropathy- resolved   Disposition: [name] is clinically doing very well.  Exam is benign. We reviewed the surveillance CT CAP which shows no evidence of recurrence, she remains in clinical remission.   She will continue colon cancer surveillance, healthy active lifestyle, and age appropriate maintenance. Per pt last colonoscopy was more recent than 04/2020 but is not reflected in our system, she is followed by Dr. Loreta Ave. She is overdue for mammogram, order placed today.  She will return 3/10 for scheduled port/hepatic pump flush and lab, then continue monthly port and hepatic pump flush. We will see her back with a CEA in 6 months, and surveillance scan in 12 months. We will discuss port and hepatic artery pump removal at next visit.  She knows to call sooner if needed. Pt seen with Dr. Truett Perna.   Orders Placed This Encounter  Procedures   MM 3D SCREENING MAMMOGRAM  BILATERAL BREAST    Standing Status:   Future    Expected Date:   02/06/2024    Expiration Date:   01/22/2025    Reason for Exam (SYMPTOM  OR DIAGNOSIS REQUIRED):   routine screening, high risk    Is the patient pregnant?:   No    Preferred imaging location?:   [facility]   CEA (Access)-[facility] ONLY    Standing Status:   Future    Expected Date:   07/25/2024    Expiration Date:   01/22/2025   Basic Metabolic Panel - Cancer Center Only    Standing Status:   Future    Expected Date:   07/25/2024    Expiration Date:   01/22/2025   CBC with Differential ([facility]cer Center Only)    Standing Status:   Future    Expected Date:   07/25/2024    Expiration Date:   01/22/2025      All questions were answered. The patient knows to call the clinic with any problems, questions or concerns. No barriers to learning  were detected.   Santiago Glad, NP-C 01/23/2024  This was a shared visit with Santiago Glad.  [name] remains in clinical remission from colon cancer.  The restaging CTs revealed no evidence of disease progression.  She is now 4 years out from the last liver resection.  We will change to yearly imaging.  She will return for an office visit and CEA in 6 months.  Mancel Bale, MD

## 2024-01-28 ENCOUNTER — Inpatient Hospital Stay: Payer: BC Managed Care – PPO

## 2024-01-28 VITALS — BP 120/88 | HR 81 | Temp 98.4°F | Resp 18 | Ht 68.0 in | Wt 147.1 lb

## 2024-01-28 DIAGNOSIS — Z95828 Presence of other vascular implants and grafts: Secondary | ICD-10-CM

## 2024-01-28 DIAGNOSIS — C187 Malignant neoplasm of sigmoid colon: Secondary | ICD-10-CM

## 2024-01-28 DIAGNOSIS — Z85038 Personal history of other malignant neoplasm of large intestine: Secondary | ICD-10-CM | POA: Diagnosis not present

## 2024-01-28 MED ORDER — HEPARIN SOD (PORK) LOCK FLUSH 100 UNIT/ML IV SOLN: 500.0000 [IU] | Freq: Once | INTRAVENOUS | Status: DC | PRN

## 2024-01-28 MED ORDER — SODIUM CHLORIDE (PF) 0.9 % IJ SOLN
Freq: Once | INTRAMUSCULAR | Status: AC
  Filled 2024-01-28: qty 5

## 2024-01-28 MED ORDER — SODIUM CHLORIDE 0.9% FLUSH: 10.0000 mL | INTRAVENOUS | Status: DC | PRN

## 2024-01-28 NOTE — Progress Notes (Signed)
  Accessed Medtronic Pump LUQ abdomen using sterile technique without difficulty. Aspirated 2.0 ml (Ipad noted 0.8 ml). Flushed with 20 ml NS with 25,000 units heparin aspirating every 3 ml. Needle removed and bandaid applied. Patient will return on 02/22/2024 for her next refill, Ipad recommends refilling pump by 03/01/2024. Patient tolerated procedure well.

## 2024-01-28 NOTE — Patient Instructions (Signed)

## 2024-02-13 ENCOUNTER — Ambulatory Visit: Payer: BC Managed Care – PPO

## 2024-02-22 ENCOUNTER — Inpatient Hospital Stay: Attending: Oncology

## 2024-02-22 VITALS — BP 113/89 | HR 79 | Temp 98.2°F | Resp 18 | Ht 68.0 in | Wt 143.1 lb

## 2024-02-22 DIAGNOSIS — C187 Malignant neoplasm of sigmoid colon: Secondary | ICD-10-CM

## 2024-02-22 DIAGNOSIS — Z85038 Personal history of other malignant neoplasm of large intestine: Secondary | ICD-10-CM | POA: Insufficient documentation

## 2024-02-22 DIAGNOSIS — Z95828 Presence of other vascular implants and grafts: Secondary | ICD-10-CM

## 2024-02-22 MED ORDER — SODIUM CHLORIDE (PF) 0.9 % IJ SOLN
Freq: Once | INTRAMUSCULAR | Status: AC
  Filled 2024-02-22: qty 5

## 2024-02-22 MED ORDER — SODIUM CHLORIDE 0.9% FLUSH: 10.0000 mL | INTRAVENOUS | Status: DC | PRN

## 2024-02-22 MED ORDER — HEPARIN SOD (PORK) LOCK FLUSH 100 UNIT/ML IV SOLN: 500.0000 [IU] | Freq: Once | INTRAVENOUS | Status: DC | PRN

## 2024-02-22 NOTE — Progress Notes (Signed)
 Accessed Medtronic Pump LUQ abdomen using sterile technique without difficulty. Aspirated 5.1 ml (Ipad noted 5.0 ml). Flushed with 20 ml NS with 25,000 units heparin aspirating every 3 ml. Needle removed and bandaid applied. Patient will return on 03/24/2024 for her next refill, Ipad recommends refilling pump by 03/26/2024. Patient tolerated procedure well.

## 2024-02-22 NOTE — Patient Instructions (Signed)

## 2024-03-05 ENCOUNTER — Ambulatory Visit
Admission: RE | Admit: 2024-03-05 | Discharge: 2024-03-05 | Disposition: A | Discharge status: Home / Self Care | Source: Ambulatory Visit | Attending: Nurse Practitioner | Admitting: Nurse Practitioner

## 2024-03-05 DIAGNOSIS — Z1231 Encounter for screening mammogram for malignant neoplasm of breast: Secondary | ICD-10-CM

## 2024-03-24 ENCOUNTER — Inpatient Hospital Stay: Attending: Oncology

## 2024-03-24 VITALS — BP 112/93 | HR 73 | Temp 97.9°F | Resp 16 | Ht 68.0 in | Wt 146.0 lb

## 2024-03-24 DIAGNOSIS — C187 Malignant neoplasm of sigmoid colon: Secondary | ICD-10-CM

## 2024-03-24 DIAGNOSIS — Z95828 Presence of other vascular implants and grafts: Secondary | ICD-10-CM

## 2024-03-24 DIAGNOSIS — Z85038 Personal history of other malignant neoplasm of large intestine: Secondary | ICD-10-CM | POA: Insufficient documentation

## 2024-03-24 MED ORDER — SODIUM CHLORIDE 0.9% FLUSH
10.0000 mL | INTRAVENOUS | Status: DC | PRN
  Administered 2024-03-24: 10 mL via INTRAVENOUS

## 2024-03-24 MED ORDER — ALTEPLASE 2 MG IJ SOLR
2.0000 mg | Freq: Once | INTRAMUSCULAR | Status: DC | PRN
Start: 2024-03-24 — End: 2024-03-24

## 2024-03-24 MED ORDER — SODIUM CHLORIDE (PF) 0.9 % IJ SOLN
Freq: Once | INTRAMUSCULAR | Status: AC
  Filled 2024-03-24: qty 5

## 2024-03-24 MED ORDER — HEPARIN SOD (PORK) LOCK FLUSH 100 UNIT/ML IV SOLN
500.0000 [IU] | Freq: Once | INTRAVENOUS | Status: AC | PRN
  Administered 2024-03-24: 500 [IU] via INTRAVENOUS

## 2024-03-24 NOTE — Progress Notes (Signed)
  Accessed Medtronic Pump LUQ abdomen using sterile technique without difficulty. Aspirated 2.0 ml (Ipad noted 1.4 ml). Flushed with 20 ml NS with 25,000 units heparin  aspirating every 3 ml. Needle removed and bandaid applied. Patient will return on 04/24/2024 for her next refill, Ipad recommends refilling pump by 04/26/2024. Patient tolerated procedure well.

## 2024-03-24 NOTE — Patient Instructions (Signed)

## 2024-04-21 ENCOUNTER — Inpatient Hospital Stay: Attending: Oncology

## 2024-04-21 VITALS — BP 122/90 | HR 86 | Temp 98.3°F | Resp 18 | Ht 68.0 in | Wt 141.4 lb

## 2024-04-21 DIAGNOSIS — Z85038 Personal history of other malignant neoplasm of large intestine: Secondary | ICD-10-CM | POA: Insufficient documentation

## 2024-04-21 DIAGNOSIS — Z95828 Presence of other vascular implants and grafts: Secondary | ICD-10-CM

## 2024-04-21 DIAGNOSIS — C187 Malignant neoplasm of sigmoid colon: Secondary | ICD-10-CM

## 2024-04-21 MED ORDER — SODIUM CHLORIDE 0.9% FLUSH: 10.0000 mL | INTRAVENOUS | Status: DC | PRN

## 2024-04-21 MED ORDER — HEPARIN SOD (PORK) LOCK FLUSH 100 UNIT/ML IV SOLN
500.0000 [IU] | Freq: Once | INTRAVENOUS | Status: DC | PRN
Start: 2024-04-21 — End: 2024-04-21

## 2024-04-21 MED ORDER — SODIUM CHLORIDE (PF) 0.9 % IJ SOLN
Freq: Once | INTRAMUSCULAR | Status: AC
  Filled 2024-04-21: qty 5

## 2024-04-21 NOTE — Progress Notes (Signed)
  Accessed Medtronic Pump LUQ abdomen using sterile technique without difficulty. Aspirated 5.0 ml (Ipad noted 3.2 ml). Flushed with 20 ml NS with 25,000 units heparin  aspirating every 3 ml. Needle removed and bandaid applied. Patient will return on 04/22/2024 for her next refill, Ipad recommends refilling pump by 04/24/2024. Patient tolerated procedure well.

## 2024-04-24 ENCOUNTER — Ambulatory Visit

## 2024-05-21 ENCOUNTER — Inpatient Hospital Stay: Attending: Oncology

## 2024-05-21 VITALS — BP 121/98 | HR 86 | Temp 97.7°F | Resp 18 | Ht 68.0 in | Wt 142.1 lb

## 2024-05-21 DIAGNOSIS — Z85038 Personal history of other malignant neoplasm of large intestine: Secondary | ICD-10-CM | POA: Diagnosis present

## 2024-05-21 DIAGNOSIS — C187 Malignant neoplasm of sigmoid colon: Secondary | ICD-10-CM

## 2024-05-21 DIAGNOSIS — Z95828 Presence of other vascular implants and grafts: Secondary | ICD-10-CM

## 2024-05-21 MED ORDER — SODIUM CHLORIDE 0.9% FLUSH
10.0000 mL | INTRAVENOUS | Status: DC | PRN
Start: 2024-05-21 — End: 2024-05-21
  Administered 2024-05-21: 10 mL via INTRAVENOUS

## 2024-05-21 MED ORDER — HEPARIN SOD (PORK) LOCK FLUSH 100 UNIT/ML IV SOLN
500.0000 [IU] | Freq: Once | INTRAVENOUS | Status: AC | PRN
  Administered 2024-05-21: 500 [IU] via INTRAVENOUS

## 2024-05-21 MED ORDER — SODIUM CHLORIDE (PF) 0.9 % IJ SOLN
Freq: Once | INTRAMUSCULAR | Status: AC
  Filled 2024-05-21: qty 5

## 2024-05-21 NOTE — Patient Instructions (Signed)
 Heparin  Injection What is this medication? HEPARIN  INJECTION (HEP a rin) prevents and treats blood clots. It belongs to a group of medications called blood thinners. This medicine may be used for other purposes; ask your health care provider or pharmacist if you have questions. COMMON BRAND NAME(S): Hep-Lock, Hep-Lock U/P, Hepflush-10, Monoject Prefill Advanced Heparin  Lock Flush, SASH Normal Saline and Heparin  What should I tell my care team before I take this medication? They need to know if you have any of these conditions: Bleeding disorders, such as hemophilia or low blood platelets Bowel disease or diverticulitis Endocarditis High blood pressure Liver disease Recent surgery Stomach ulcers An unusual or allergic reaction to heparin , other medications, foods, dyes, or preservatives Pregnant, trying to get pregnant, or recently pregnant Breastfeeding How should I use this medication? This medication is injected into a vein or under the skin. It is usually given by your care team in a hospital or clinic setting. If you get this medication at home, you will be taught how to prepare and give it. For your therapy to work as well as possible, take each dose exactly as prescribed on the prescription label. Do not skip doses. Skipping doses or stopping this medication can increase your risk of a blood clot. Keep taking this medication unless your care team tells you to stop. It is important that you put your used needles and syringes in a special sharps container. Do not put them in a trash can. If you do not have a sharps container, call your pharmacist or care team to get one. Talk to your care team about the use of this medication in children. While this medication may be prescribed for children for selected conditions, precautions do apply. Overdosage: If you think you have taken too much of this medicine contact a poison control center or emergency room at once. NOTE: This medicine is only  for you. Do not share this medicine with others. What if I miss a dose? If you miss a dose, take it as soon as you can. If it is almost time for your next dose, take only that dose. Do not take double or extra doses. What may interact with this medication? Do not take this medication with any of the following: Defibrotide Mifepristone Oritavancin Protamine Telavancin This medication may also interact with the following: Aspirin and aspirin-like medications NSAIDs, medications for pain and inflammation, such as ibuprofen or naproxen Other medications that treat or prevent blood clots, such as warfarin or enoxaparin This list may not describe all possible interactions. Give your health care provider a list of all the medicines, herbs, non-prescription drugs, or dietary supplements you use. Also tell them if you smoke, drink alcohol, or use illegal drugs. Some items may interact with your medicine. What should I watch for while using this medication? Your condition will be monitored carefully while you are receiving this medication. Tell your care team if your symptoms do not start to get better or if they get worse. You may need blood work done while you are taking this medication. In rare cases, the body's immune system may cause platelets to clump together in response to heparin . This causes a blood clot. It can happen weeks after stopping heparin . Talk to your care team right away if you have pain, swelling, or warmth in the leg, shortness of breath, or chest pain. Avoid sports and activities that may cause injury while you are taking this medication. Severe falls or injuries can cause unseen bleeding. Be  careful when using sharp tools or knives. Consider using an Neurosurgeon. Take special care brushing or flossing your teeth. Report any injuries, bruising, or red spots on the skin to your care team. If you are going to need surgery or a procedure, tell your care team that you are taking this  medication. Tell your dentist and dental surgeon that you are taking this medication. Wear a medical ID bracelet or chain. Carry a card that describes your condition. List the medications and doses you take on the card. What side effects may I notice from receiving this medication? Side effects that you should report to your care team as soon as possible: Allergic reactions--skin rash, itching, hives, swelling of the face, lips, tongue, or throat Bleeding--bloody or black, tar-like stools, vomiting blood or brown material that looks like coffee grounds, red or dark brown urine, small red or purple spots on skin, unusual bruising or bleeding Blood clot--pain, swelling, or warmth in the leg, shortness of breath, chest pain High potassium level--muscle weakness, fast or irregular heartbeat Side effects that usually do not require medical attention (report these to your care team if they continue or are bothersome): Pain, redness, or irritation at injection site This list may not describe all possible side effects. Call your doctor for medical advice about side effects. You may report side effects to FDA at 1-800-FDA-1088. Where should I keep my medication? Keep out of the reach of children and pets. Store at room temperature between 20 and 25 degrees C (68 and 77 degrees F). Do not freeze. Get rid of any unused medication after the expiration date. To get rid of medications that are no longer needed or have expired: Take the medication to a take-back program. Check with your pharmacy or law enforcement to find a location. If you cannot return the medication, ask your pharmacist or care team how to get rid of it safely. NOTE: This sheet is a summary. It may not cover all possible information. If you have questions about this medicine, talk to your doctor, pharmacist, or health care provider.  2025 Elsevier/Gold Standard (2023-12-06 00:00:00)

## 2024-05-21 NOTE — Progress Notes (Signed)
 Accessed Medtronic Pump LUQ abdomen using sterile technique without difficulty. Aspirated 5.0 ml (Ipad noted 2.0 ml). Flushed with 20 ml NS with 25,000 units heparin  aspirating every 3 ml. Needle removed and bandaid applied. Patient will return on 06/23/2024 for her next refill, Ipad recommends refilling pump by 06/23/2024. Patient tolerated procedure well.

## 2024-05-22 ENCOUNTER — Ambulatory Visit

## 2024-06-16 ENCOUNTER — Telehealth: Payer: Self-pay

## 2024-06-16 NOTE — Telephone Encounter (Signed)
 Telephone call placed to patient to reschedule her Medtronic pump refill appointment from Monday, 06/23/2024, to Friday, 06/20/2024. Patient was agreeable to the change and verbalized understanding.

## 2024-06-20 ENCOUNTER — Inpatient Hospital Stay: Attending: Oncology

## 2024-06-20 VITALS — BP 109/81 | HR 83 | Temp 98.2°F | Resp 18 | Wt 138.2 lb

## 2024-06-20 DIAGNOSIS — C187 Malignant neoplasm of sigmoid colon: Secondary | ICD-10-CM

## 2024-06-20 DIAGNOSIS — Z85038 Personal history of other malignant neoplasm of large intestine: Secondary | ICD-10-CM | POA: Insufficient documentation

## 2024-06-20 DIAGNOSIS — Z95828 Presence of other vascular implants and grafts: Secondary | ICD-10-CM

## 2024-06-20 DIAGNOSIS — Z08 Encounter for follow-up examination after completed treatment for malignant neoplasm: Secondary | ICD-10-CM | POA: Insufficient documentation

## 2024-06-20 MED ORDER — SODIUM CHLORIDE (PF) 0.9 % IJ SOLN
Freq: Once | INTRAMUSCULAR | Status: AC
  Filled 2024-06-20: qty 5

## 2024-06-20 NOTE — Patient Instructions (Signed)
 Heparin  Injection What is this medication? HEPARIN  INJECTION (HEP a rin) prevents and treats blood clots. It belongs to a group of medications called blood thinners. This medicine may be used for other purposes; ask your health care provider or pharmacist if you have questions. COMMON BRAND NAME(S): Hep-Lock, Hep-Lock U/P, Hepflush-10, Monoject Prefill Advanced Heparin  Lock Flush, SASH Normal Saline and Heparin  What should I tell my care team before I take this medication? They need to know if you have any of these conditions: Bleeding disorders, such as hemophilia or low blood platelets Bowel disease or diverticulitis Endocarditis High blood pressure Liver disease Recent surgery Stomach ulcers An unusual or allergic reaction to heparin , other medications, foods, dyes, or preservatives Pregnant, trying to get pregnant, or recently pregnant Breastfeeding How should I use this medication? This medication is injected into a vein or under the skin. It is usually given by your care team in a hospital or clinic setting. If you get this medication at home, you will be taught how to prepare and give it. For your therapy to work as well as possible, take each dose exactly as prescribed on the prescription label. Do not skip doses. Skipping doses or stopping this medication can increase your risk of a blood clot. Keep taking this medication unless your care team tells you to stop. It is important that you put your used needles and syringes in a special sharps container. Do not put them in a trash can. If you do not have a sharps container, call your pharmacist or care team to get one. Talk to your care team about the use of this medication in children. While this medication may be prescribed for children for selected conditions, precautions do apply. Overdosage: If you think you have taken too much of this medicine contact a poison control center or emergency room at once. NOTE: This medicine is only  for you. Do not share this medicine with others. What if I miss a dose? If you miss a dose, take it as soon as you can. If it is almost time for your next dose, take only that dose. Do not take double or extra doses. What may interact with this medication? Do not take this medication with any of the following: Defibrotide Mifepristone Oritavancin Protamine Telavancin This medication may also interact with the following: Aspirin and aspirin-like medications NSAIDs, medications for pain and inflammation, such as ibuprofen or naproxen Other medications that treat or prevent blood clots, such as warfarin or enoxaparin This list may not describe all possible interactions. Give your health care provider a list of all the medicines, herbs, non-prescription drugs, or dietary supplements you use. Also tell them if you smoke, drink alcohol, or use illegal drugs. Some items may interact with your medicine. What should I watch for while using this medication? Your condition will be monitored carefully while you are receiving this medication. Tell your care team if your symptoms do not start to get better or if they get worse. You may need blood work done while you are taking this medication. In rare cases, the body's immune system may cause platelets to clump together in response to heparin . This causes a blood clot. It can happen weeks after stopping heparin . Talk to your care team right away if you have pain, swelling, or warmth in the leg, shortness of breath, or chest pain. Avoid sports and activities that may cause injury while you are taking this medication. Severe falls or injuries can cause unseen bleeding. Be  careful when using sharp tools or knives. Consider using an Neurosurgeon. Take special care brushing or flossing your teeth. Report any injuries, bruising, or red spots on the skin to your care team. If you are going to need surgery or a procedure, tell your care team that you are taking this  medication. Tell your dentist and dental surgeon that you are taking this medication. Wear a medical ID bracelet or chain. Carry a card that describes your condition. List the medications and doses you take on the card. What side effects may I notice from receiving this medication? Side effects that you should report to your care team as soon as possible: Allergic reactions--skin rash, itching, hives, swelling of the face, lips, tongue, or throat Bleeding--bloody or black, tar-like stools, vomiting blood or brown material that looks like coffee grounds, red or dark brown urine, small red or purple spots on skin, unusual bruising or bleeding Blood clot--pain, swelling, or warmth in the leg, shortness of breath, chest pain High potassium level--muscle weakness, fast or irregular heartbeat Side effects that usually do not require medical attention (report these to your care team if they continue or are bothersome): Pain, redness, or irritation at injection site This list may not describe all possible side effects. Call your doctor for medical advice about side effects. You may report side effects to FDA at 1-800-FDA-1088. Where should I keep my medication? Keep out of the reach of children and pets. Store at room temperature between 20 and 25 degrees C (68 and 77 degrees F). Do not freeze. Get rid of any unused medication after the expiration date. To get rid of medications that are no longer needed or have expired: Take the medication to a take-back program. Check with your pharmacy or law enforcement to find a location. If you cannot return the medication, ask your pharmacist or care team how to get rid of it safely. NOTE: This sheet is a summary. It may not cover all possible information. If you have questions about this medicine, talk to your doctor, pharmacist, or health care provider.  2025 Elsevier/Gold Standard (2023-12-06 00:00:00)

## 2024-06-20 NOTE — Progress Notes (Signed)
  Accessed Medtronic Pump LUQ abdomen using sterile technique without difficulty. Aspirated 5.0 ml (Ipad noted 2.0 ml). Flushed with 20 ml NS with 25,000 units heparin  aspirating every 3 ml. Needle removed and bandaid applied. Patient will return on 07/25/2024 for her next refill, Ipad recommends refilling pump by 07/23/2024. Patient tolerated procedure well.

## 2024-06-23 ENCOUNTER — Inpatient Hospital Stay

## 2024-06-24 ENCOUNTER — Ambulatory Visit

## 2024-07-24 ENCOUNTER — Inpatient Hospital Stay

## 2024-07-24 ENCOUNTER — Inpatient Hospital Stay: Attending: Oncology

## 2024-07-24 VITALS — BP 117/80

## 2024-07-24 VITALS — HR 87 | Temp 98.2°F | Resp 18

## 2024-07-24 DIAGNOSIS — Z9221 Personal history of antineoplastic chemotherapy: Secondary | ICD-10-CM | POA: Insufficient documentation

## 2024-07-24 DIAGNOSIS — T451X5A Adverse effect of antineoplastic and immunosuppressive drugs, initial encounter: Secondary | ICD-10-CM | POA: Diagnosis not present

## 2024-07-24 DIAGNOSIS — D709 Neutropenia, unspecified: Secondary | ICD-10-CM | POA: Insufficient documentation

## 2024-07-24 DIAGNOSIS — Z95828 Presence of other vascular implants and grafts: Secondary | ICD-10-CM

## 2024-07-24 DIAGNOSIS — C187 Malignant neoplasm of sigmoid colon: Secondary | ICD-10-CM

## 2024-07-24 DIAGNOSIS — Z85038 Personal history of other malignant neoplasm of large intestine: Secondary | ICD-10-CM | POA: Insufficient documentation

## 2024-07-24 DIAGNOSIS — Z800 Family history of malignant neoplasm of digestive organs: Secondary | ICD-10-CM | POA: Diagnosis not present

## 2024-07-24 DIAGNOSIS — D6959 Other secondary thrombocytopenia: Secondary | ICD-10-CM | POA: Insufficient documentation

## 2024-07-24 DIAGNOSIS — Z08 Encounter for follow-up examination after completed treatment for malignant neoplasm: Secondary | ICD-10-CM | POA: Diagnosis present

## 2024-07-24 DIAGNOSIS — Z1231 Encounter for screening mammogram for malignant neoplasm of breast: Secondary | ICD-10-CM

## 2024-07-24 DIAGNOSIS — Z803 Family history of malignant neoplasm of breast: Secondary | ICD-10-CM | POA: Insufficient documentation

## 2024-07-24 LAB — CBC WITH DIFFERENTIAL (CANCER CENTER ONLY)
Abs Immature Granulocytes: 0.01 K/uL (ref 0.00–0.07)
Basophils Absolute: 0.1 K/uL (ref 0.0–0.1)
Basophils Relative: 1 %
Eosinophils Absolute: 0.1 K/uL (ref 0.0–0.5)
Eosinophils Relative: 3 %
HCT: 40.6 % (ref 36.0–46.0)
Hemoglobin: 13.9 g/dL (ref 12.0–15.0)
Immature Granulocytes: 0 %
Lymphocytes Relative: 28 %
Lymphs Abs: 1.3 K/uL (ref 0.7–4.0)
MCH: 31 pg (ref 26.0–34.0)
MCHC: 34.2 g/dL (ref 30.0–36.0)
MCV: 90.4 fL (ref 80.0–100.0)
Monocytes Absolute: 0.5 K/uL (ref 0.1–1.0)
Monocytes Relative: 11 %
Neutro Abs: 2.7 K/uL (ref 1.7–7.7)
Neutrophils Relative %: 57 %
Platelet Count: 202 K/uL (ref 150–400)
RBC: 4.49 MIL/uL (ref 3.87–5.11)
RDW: 12.5 % (ref 11.5–15.5)
WBC Count: 4.8 K/uL (ref 4.0–10.5)
nRBC: 0 % (ref 0.0–0.2)

## 2024-07-24 LAB — BASIC METABOLIC PANEL - CANCER CENTER ONLY
Anion gap: 12 (ref 5–15)
BUN: 14 mg/dL (ref 6–20)
CO2: 23 mmol/L (ref 22–32)
Calcium: 10.1 mg/dL (ref 8.9–10.3)
Chloride: 104 mmol/L (ref 98–111)
Creatinine: 0.84 mg/dL (ref 0.44–1.00)
GFR, Estimated: >60 mL/min (ref >60)
Glucose, Bld: 101 mg/dL — ABNORMAL HIGH (ref 70–99)
Potassium: 4.2 mmol/L (ref 3.5–5.1)
Sodium: 139 mmol/L (ref 135–145)

## 2024-07-24 LAB — CEA (ACCESS): CEA (CHCC): <1 ng/mL (ref 0.00–5.00)

## 2024-07-24 MED ORDER — SODIUM CHLORIDE 0.9% FLUSH: 10.0000 mL | INTRAVENOUS | Status: DC | PRN

## 2024-07-24 MED ORDER — HEPARIN SODIUM (PORCINE) 5000 UNIT/ML IJ SOLN: Freq: Once | INTRAMUSCULAR | Status: DC

## 2024-07-24 MED ORDER — SODIUM CHLORIDE (PF) 0.9 % IJ SOLN
Freq: Once | INTRAMUSCULAR | Status: AC
  Filled 2024-07-24: qty 5

## 2024-07-24 NOTE — Progress Notes (Signed)
 Patient was originally scheduled for Lab, Flush, Medtronic Pump Refill, and OV on Friday, 07/25/2024. However, patient presented to clinic this morning reporting that her Medtronic Pump has been alarming every 2 hours since Wednesday, 07/23/2024. Patient was immediately added to the day's schedule for Lab, Flush, and Medtronic Pump Refill.  Medtronic Pump accessed in the LUQ abdomen using sterile technique without difficulty. Aspirated 2.0 ml (iPad reflected 0 ml). iPad also displayed caution alarms for extremely low reservoir volume. Pump was flushed with 20 ml NS containing 25,000 units heparin , aspirating every 3 ml. Needle removed, bandage applied. Patient tolerated procedure well.  Unable to successfully update iPad to generate new refill date and reservoir volume--still noting low volumes. Patient agreed to monitor for alarms until the following day to allow for potential reset.  Patient scheduled to return on 07/25/2024 for OV with Dr. Cloretta and will notify this writer of any continued alarms. This Clinical research associate will follow up with patient to confirm reservoir volume and generate new refill date in iPad.

## 2024-07-24 NOTE — Patient Instructions (Signed)
 Heparin  Injection What is this medication? HEPARIN  INJECTION (HEP a rin) prevents and treats blood clots. It belongs to a group of medications called blood thinners. This medicine may be used for other purposes; ask your health care provider or pharmacist if you have questions. COMMON BRAND NAME(S): Hep-Lock, Hep-Lock U/P, Hepflush-10, Monoject Prefill Advanced Heparin  Lock Flush, SASH Normal Saline and Heparin  What should I tell my care team before I take this medication? They need to know if you have any of these conditions: Bleeding disorders, such as hemophilia or low blood platelets Bowel disease or diverticulitis Endocarditis High blood pressure Liver disease Recent surgery Stomach ulcers An unusual or allergic reaction to heparin , other medications, foods, dyes, or preservatives Pregnant, trying to get pregnant, or recently pregnant Breastfeeding How should I use this medication? This medication is injected into a vein or under the skin. It is usually given by your care team in a hospital or clinic setting. If you get this medication at home, you will be taught how to prepare and give it. For your therapy to work as well as possible, take each dose exactly as prescribed on the prescription label. Do not skip doses. Skipping doses or stopping this medication can increase your risk of a blood clot. Keep taking this medication unless your care team tells you to stop. It is important that you put your used needles and syringes in a special sharps container. Do not put them in a trash can. If you do not have a sharps container, call your pharmacist or care team to get one. Talk to your care team about the use of this medication in children. While this medication may be prescribed for children for selected conditions, precautions do apply. Overdosage: If you think you have taken too much of this medicine contact a poison control center or emergency room at once. NOTE: This medicine is only  for you. Do not share this medicine with others. What if I miss a dose? If you miss a dose, take it as soon as you can. If it is almost time for your next dose, take only that dose. Do not take double or extra doses. What may interact with this medication? Do not take this medication with any of the following: Defibrotide Mifepristone Oritavancin Protamine Telavancin This medication may also interact with the following: Aspirin and aspirin-like medications NSAIDs, medications for pain and inflammation, such as ibuprofen or naproxen Other medications that treat or prevent blood clots, such as warfarin or enoxaparin This list may not describe all possible interactions. Give your health care provider a list of all the medicines, herbs, non-prescription drugs, or dietary supplements you use. Also tell them if you smoke, drink alcohol, or use illegal drugs. Some items may interact with your medicine. What should I watch for while using this medication? Your condition will be monitored carefully while you are receiving this medication. Tell your care team if your symptoms do not start to get better or if they get worse. You may need blood work done while you are taking this medication. In rare cases, the body's immune system may cause platelets to clump together in response to heparin . This causes a blood clot. It can happen weeks after stopping heparin . Talk to your care team right away if you have pain, swelling, or warmth in the leg, shortness of breath, or chest pain. Avoid sports and activities that may cause injury while you are taking this medication. Severe falls or injuries can cause unseen bleeding. Be  careful when using sharp tools or knives. Consider using an Neurosurgeon. Take special care brushing or flossing your teeth. Report any injuries, bruising, or red spots on the skin to your care team. If you are going to need surgery or a procedure, tell your care team that you are taking this  medication. Tell your dentist and dental surgeon that you are taking this medication. Wear a medical ID bracelet or chain. Carry a card that describes your condition. List the medications and doses you take on the card. What side effects may I notice from receiving this medication? Side effects that you should report to your care team as soon as possible: Allergic reactions--skin rash, itching, hives, swelling of the face, lips, tongue, or throat Bleeding--bloody or black, tar-like stools, vomiting blood or brown material that looks like coffee grounds, red or dark brown urine, small red or purple spots on skin, unusual bruising or bleeding Blood clot--pain, swelling, or warmth in the leg, shortness of breath, chest pain High potassium level--muscle weakness, fast or irregular heartbeat Side effects that usually do not require medical attention (report these to your care team if they continue or are bothersome): Pain, redness, or irritation at injection site This list may not describe all possible side effects. Call your doctor for medical advice about side effects. You may report side effects to FDA at 1-800-FDA-1088. Where should I keep my medication? Keep out of the reach of children and pets. Store at room temperature between 20 and 25 degrees C (68 and 77 degrees F). Do not freeze. Get rid of any unused medication after the expiration date. To get rid of medications that are no longer needed or have expired: Take the medication to a take-back program. Check with your pharmacy or law enforcement to find a location. If you cannot return the medication, ask your pharmacist or care team how to get rid of it safely. NOTE: This sheet is a summary. It may not cover all possible information. If you have questions about this medicine, talk to your doctor, pharmacist, or health care provider.  2025 Elsevier/Gold Standard (2023-12-06 00:00:00)

## 2024-07-25 ENCOUNTER — Ambulatory Visit

## 2024-07-25 ENCOUNTER — Other Ambulatory Visit

## 2024-07-25 ENCOUNTER — Inpatient Hospital Stay (HOSPITAL_BASED_OUTPATIENT_CLINIC_OR_DEPARTMENT_OTHER): Admitting: Oncology

## 2024-07-25 ENCOUNTER — Inpatient Hospital Stay

## 2024-07-25 VITALS — BP 120/86 | HR 79 | Temp 98.2°F | Resp 18 | Ht 68.0 in | Wt 140.1 lb

## 2024-07-25 DIAGNOSIS — Z08 Encounter for follow-up examination after completed treatment for malignant neoplasm: Secondary | ICD-10-CM | POA: Diagnosis not present

## 2024-07-25 DIAGNOSIS — C187 Malignant neoplasm of sigmoid colon: Secondary | ICD-10-CM | POA: Diagnosis not present

## 2024-07-25 NOTE — Progress Notes (Signed)
 [facility] OFFICE PROGRESS NOTE   Diagnosis: Colon cancer  INTERVAL HISTORY:   [name] returns as scheduled.  She feels well.  Good appetite and energy level.  No complaint.  A Port-A-Cath and abdominal infusion pump remain in place.  Objective:  Vital signs in last 24 hours:  Blood pressure 120/86, pulse 79, temperature 98.2 F (36.8 C), temperature source Temporal, resp. rate 18, height 5' 8 (1.727 m), weight 140 lb 1.6 oz (63.5 kg), last menstrual period 04/20/2013, SpO2 100%.    Lymphatics: No cervical, supraclavicular, axillary, or inguinal nodes Resp: Lungs clear bilaterally Cardio: Regular rate and rhythm GI: No hepatosplenomegaly, no mass, nontender, left abdominal infusion pump Vascular: No leg edema   Portacath/PICC-without erythema  Lab Results:  Lab Results  Component Value Date   WBC 4.8 07/24/2024   HGB 13.9 07/24/2024   HCT 40.6 07/24/2024   MCV 90.4 07/24/2024   PLT 202 07/24/2024   NEUTROABS 2.7 07/24/2024    CMP  Lab Results  Component Value Date   NA 139 07/24/2024   K 4.2 07/24/2024   CL 104 07/24/2024   CO2 23 07/24/2024   GLUCOSE 101 (H) 07/24/2024   BUN 14 07/24/2024   CREATININE 0.84 07/24/2024   CALCIUM 10.1 07/24/2024   PROT 7.2 08/18/2021   ALBUMIN  4.7 08/18/2021   AST 19 08/18/2021   ALT 12 08/18/2021   ALKPHOS 99 08/18/2021   BILITOT 0.5 08/18/2021   GFRNONAA >60 07/24/2024   GFRAA >60 01/27/2020    Lab Results  Component Value Date   CEA1 <1.00 08/18/2021   CEA <1.00 07/24/2024    Lab Results  Component Value Date   INR 1.27 07/13/2016   LABPROT 16.0 (H) 07/13/2016    Imaging:  No results found.  Medications: I have reviewed the patient's current medications.   Assessment/Plan: Adenocarcinoma of the sigmoid colon, status post a colonoscopic biopsy 07/07/2016 Mismatch repair protein expression-normal Sigmoid mass noted at 20 cm from the anal verge Staging CTs of the chest, abdomen, and  pelvis negative for evidence of metastatic disease other than an indeterminate 10 mm right liver lesion MRI of the abdomen 07/07/2016 revealed multiple bilateral hepatic cyst. 7 mm inferior subcapsular right liver lesion suspicious for a metastasis PET scan 07/21/2016-hypermetabolic sigmoid colon mass, mildly hypermetabolic segment 6 liver lesion Laparoscopic assisted rectosigmoid colectomy and segment 8 liver resection 07/27/2016, pathology confirmed a T3, N2b, M1 tumor, 11/16 lymph nodes positive for metastatic carcinoma Foundation 1 testing found the tumor to be microsatellite stable, tumor mutation burden low, no BRAF or RAS mutation No loss of mismatch repair protein expression, MSI-stable Cycle 1 CAPOX 08/22/2016 Cycle 2 CAPOX 09/12/2016 Cycle 3 CAPOX 10/03/2016 (Xeloda  dose reduced to 3 tablets every morning and 2 tablets every evening beginning with cycle 3) Cycle 4 CAPOX 10/31/2016 (Xeloda  re-escalated to original dose) Cycle 5 CAPOX 11/21/2016 Cycle 6 CAPOX 12/12/2016 Cycle 7 CAPOX 01/09/2017 (oxaliplatin  held) Cycle 8 CAPOX 01/30/2017 (oxaliplatin  dose reduced) CT scans 02/27/2017-negative for recurrent colon cancer CTs 08/14/2017-negative for recurrent cancer CTs 02/12/2018-negative for recurrent cancer CTs 07/29/2018-negative for recurrent cancer  CTs 01/27/2019-negative for recurrent cancer, new 8 mm subpleural left lower lobe nodule CT chest 04/29/2019- resolution of left lower lobe nodule CT 07/29/2019- negative for recurrent cancer CTs 01/27/2020-stable 4 mm right apical lung nodule.  New 1.6 x 1.6 cm lesion identified in the lateral segment left liver.  Stable 9 mm subcapsular lesion interpolar left kidney. MRI liver 3/10/ 2021-15 mm enhancing lesion in  segment 3, new from September 2020, subcentimeter hemorrhagic cyst in the left kidney Robotic assisted partial hepatectomy (segment 3), cholecystectomy, portal lymphadenectomy, and insertion of hepatic infusion pump 02/12/2020, 0/3 nodes,  1.9 cm adenocarcinoma in segment 3, negative margins 03/11/2020-FUDR  and infusional 5-FU #1 04/08/2020-FUDR  #2 05/06/2020-FUDR #3 06/03/2020-FUDR #4 canceled secondary to elevation of the alkaline phosphatase 07/15/2020-FUDR #4, 50% dose reduction 09/09/2000-CTs chest/abdomen/pelvis-negative 12/02/2020- CTs negative for recurrent disease 02/21/2021-CTs negative for recurrent disease, stable surgical changes in the liver CTs 08/18/2021-no evidence of recurrent disease, stable postoperative findings of partial hepatectomy CTs 02/21/2022-no evidence of recurrent disease CTs 07/25/2022-no evidence of recurrent disease CTs 01/30/2023-no evidence of recurrent disease CTs 08/01/2023-no evidence of metastatic disease in the abdomen or pelvis, few new pulmonary nodules CTs 01/23/2024 - no evidence of recurrence, few pulmonary nodules from last CT 08/01/23 have resolved, supporting benign etiology    2.   History of rectal bleeding secondary to #1   3.   Multiple polyps on the colonoscopy 07/07/2016 Admission 07/13/2016 with  acute GI bleeding secondary to an ulcer at the cecum Colonoscopy 10/08/2017-small polyps removed from the cecum-colonic mucosa with lymphoid aggregates-no dysplasia or malignancy   4.   Family history of breast and pancreas cancer    5.  Delayed nausea following cycle 1 CAPOX-emend  added with cycle 2   6. Diarrhea following cycle 2 and cycle 6 CAPOX   7.  Neutropenia/thrombocytopenia secondary to chemotherapy   8.  History of mild oxaliplatin  neuropathy- resolved     Disposition: Patricia Hudson remains in clinical remission from colon cancer.  She will be scheduled for an office visit and surveillance CTs in 6 months.  We will consider referring her for removal of the Port-A-Cath and hepatic infusion pump if the CTs remain negative.  She we will continue Port-A-Cath antibiotic infusion pump flushes.  Arley Hof, MD  07/25/2024  9:32 AM

## 2024-07-29 ENCOUNTER — Encounter: Payer: Self-pay | Admitting: Oncology

## 2024-08-01 ENCOUNTER — Inpatient Hospital Stay

## 2024-08-01 NOTE — Progress Notes (Signed)
 Patient was seen today due to repeated alarming of her Hepatic Artery Infusion Pump (HAIP), which required troubleshooting.  This Wellsite geologist Support at [phone] for guidance and was instructed as follows:  On the patient profile iPad: select the bell icon next to Re-Interrogation.  Select Silence Alarm.  Update the pump.  If the alarm persists, Medtronic Pump Technical Services may be contacted directly at [phone].  The patient's pump was re-interrogated, alarm silenced, and updated per guidance. Current volume as of today's date is 15.2 mL.  Plan:  Patient to monitor for any further alarms over the weekend.  If alarms recur, patient to return to clinic on Monday, 08/04/2024.  If no concerns, patient to return before 08/23/2024 for scheduled refill.

## 2024-08-07 ENCOUNTER — Inpatient Hospital Stay

## 2024-08-07 NOTE — Progress Notes (Signed)
 Patient was seen today due to repeated alarming of her Hepatic Artery Infusion Pump (HAIP), despite multiple attempts to resolve the issue.  This Wellsite geologist Support [phone]) and was guided through troubleshooting, which resolved the alarm and allowed for the pump update.  The patient's pump was re-interrogated, the alarm silenced, and the device updated per guidance. Current pump volume as of today is 11.6 mL.  Alarms appear to have been successfully resolved at this time. Per iPad, the pump is due for refill before 08/23/2023.

## 2024-08-22 ENCOUNTER — Inpatient Hospital Stay: Attending: Oncology

## 2024-08-22 VITALS — BP 109/81 | HR 77 | Temp 97.8°F | Resp 18 | Ht 68.0 in | Wt 141.7 lb

## 2024-08-22 DIAGNOSIS — Z95828 Presence of other vascular implants and grafts: Secondary | ICD-10-CM

## 2024-08-22 DIAGNOSIS — C187 Malignant neoplasm of sigmoid colon: Secondary | ICD-10-CM

## 2024-08-22 DIAGNOSIS — Z85038 Personal history of other malignant neoplasm of large intestine: Secondary | ICD-10-CM | POA: Diagnosis present

## 2024-08-22 MED ORDER — SODIUM CHLORIDE (PF) 0.9 % IJ SOLN
Freq: Once | INTRAMUSCULAR | Status: AC
  Filled 2024-08-22: qty 5

## 2024-08-22 NOTE — Progress Notes (Signed)
    Accessed Medtronic Pump LUQ abdomen using sterile technique without difficulty. Aspirated 4.5 ml (Ipad noted 11.6 ml). Flushed with 20 ml NS with 25,000 units heparin  aspirating every 3 ml. Needle removed and bandaid applied. Patient will return on 09/19/2024 for her next refill, Ipad recommends refilling pump by 09/20/2024. Patient tolerated procedure well.

## 2024-08-22 NOTE — Patient Instructions (Signed)
 Heparin  Injection What is this medication? HEPARIN  INJECTION (HEP a rin) prevents and treats blood clots. It belongs to a group of medications called blood thinners. This medicine may be used for other purposes; ask your health care provider or pharmacist if you have questions. COMMON BRAND NAME(S): Hep-Lock, Hep-Lock U/P, Hepflush-10, Monoject Prefill Advanced Heparin  Lock Flush, SASH Normal Saline and Heparin  What should I tell my care team before I take this medication? They need to know if you have any of these conditions: Bleeding disorders, such as hemophilia or low blood platelets Bowel disease or diverticulitis Endocarditis High blood pressure Liver disease Recent surgery Stomach ulcers An unusual or allergic reaction to heparin , other medications, foods, dyes, or preservatives Pregnant, trying to get pregnant, or recently pregnant Breastfeeding How should I use this medication? This medication is injected into a vein or under the skin. It is usually given by your care team in a hospital or clinic setting. If you get this medication at home, you will be taught how to prepare and give it. For your therapy to work as well as possible, take each dose exactly as prescribed on the prescription label. Do not skip doses. Skipping doses or stopping this medication can increase your risk of a blood clot. Keep taking this medication unless your care team tells you to stop. It is important that you put your used needles and syringes in a special sharps container. Do not put them in a trash can. If you do not have a sharps container, call your pharmacist or care team to get one. Talk to your care team about the use of this medication in children. While this medication may be prescribed for children for selected conditions, precautions do apply. Overdosage: If you think you have taken too much of this medicine contact a poison control center or emergency room at once. NOTE: This medicine is only  for you. Do not share this medicine with others. What if I miss a dose? If you miss a dose, take it as soon as you can. If it is almost time for your next dose, take only that dose. Do not take double or extra doses. What may interact with this medication? Do not take this medication with any of the following: Defibrotide Mifepristone Oritavancin Protamine Telavancin This medication may also interact with the following: Aspirin and aspirin-like medications NSAIDs, medications for pain and inflammation, such as ibuprofen or naproxen Other medications that treat or prevent blood clots, such as warfarin or enoxaparin This list may not describe all possible interactions. Give your health care provider a list of all the medicines, herbs, non-prescription drugs, or dietary supplements you use. Also tell them if you smoke, drink alcohol, or use illegal drugs. Some items may interact with your medicine. What should I watch for while using this medication? Your condition will be monitored carefully while you are receiving this medication. Tell your care team if your symptoms do not start to get better or if they get worse. You may need blood work done while you are taking this medication. In rare cases, the body's immune system may cause platelets to clump together in response to heparin . This causes a blood clot. It can happen weeks after stopping heparin . Talk to your care team right away if you have pain, swelling, or warmth in the leg, shortness of breath, or chest pain. Avoid sports and activities that may cause injury while you are taking this medication. Severe falls or injuries can cause unseen bleeding. Be  careful when using sharp tools or knives. Consider using an Neurosurgeon. Take special care brushing or flossing your teeth. Report any injuries, bruising, or red spots on the skin to your care team. If you are going to need surgery or a procedure, tell your care team that you are taking this  medication. Tell your dentist and dental surgeon that you are taking this medication. Wear a medical ID bracelet or chain. Carry a card that describes your condition. List the medications and doses you take on the card. What side effects may I notice from receiving this medication? Side effects that you should report to your care team as soon as possible: Allergic reactions--skin rash, itching, hives, swelling of the face, lips, tongue, or throat Bleeding--bloody or black, tar-like stools, vomiting blood or brown material that looks like coffee grounds, red or dark brown urine, small red or purple spots on skin, unusual bruising or bleeding Blood clot--pain, swelling, or warmth in the leg, shortness of breath, chest pain High potassium level--muscle weakness, fast or irregular heartbeat Side effects that usually do not require medical attention (report these to your care team if they continue or are bothersome): Pain, redness, or irritation at injection site This list may not describe all possible side effects. Call your doctor for medical advice about side effects. You may report side effects to FDA at 1-800-FDA-1088. Where should I keep my medication? Keep out of the reach of children and pets. Store at room temperature between 20 and 25 degrees C (68 and 77 degrees F). Do not freeze. Get rid of any unused medication after the expiration date. To get rid of medications that are no longer needed or have expired: Take the medication to a take-back program. Check with your pharmacy or law enforcement to find a location. If you cannot return the medication, ask your pharmacist or care team how to get rid of it safely. NOTE: This sheet is a summary. It may not cover all possible information. If you have questions about this medicine, talk to your doctor, pharmacist, or health care provider.  2025 Elsevier/Gold Standard (2023-12-06 00:00:00)

## 2024-09-19 ENCOUNTER — Inpatient Hospital Stay

## 2024-09-19 VITALS — BP 111/80 | HR 82 | Temp 98.1°F | Resp 18 | Ht 68.0 in | Wt 143.2 lb

## 2024-09-19 DIAGNOSIS — Z95828 Presence of other vascular implants and grafts: Secondary | ICD-10-CM

## 2024-09-19 DIAGNOSIS — Z85038 Personal history of other malignant neoplasm of large intestine: Secondary | ICD-10-CM | POA: Diagnosis not present

## 2024-09-19 DIAGNOSIS — C187 Malignant neoplasm of sigmoid colon: Secondary | ICD-10-CM

## 2024-09-19 MED ORDER — SODIUM CHLORIDE (PF) 0.9 % IJ SOLN
Freq: Once | INTRAMUSCULAR | Status: AC
  Filled 2024-09-19: qty 5

## 2024-09-19 NOTE — Progress Notes (Signed)
         Accessed Medtronic Pump LUQ abdomen using sterile technique without difficulty. Aspirated 5 ml (Ipad noted 3.2 ml). Flushed with 20 ml NS with 25,000 units heparin  aspirating every 3 ml. Needle removed and bandaid applied. Patient will return on 10/15/2024 for her next refill, Ipad recommends refilling pump by 10/18/2024. Patient tolerated procedure well.

## 2024-10-10 ENCOUNTER — Telehealth: Payer: Self-pay

## 2024-10-10 NOTE — Telephone Encounter (Signed)
 Appointment was rescheduled from Wednesday, 10/15/24, to Tuesday, 10/14/24, due to bed availability. The patient was notified and verbalized understanding and agreement with the change.

## 2024-10-14 ENCOUNTER — Inpatient Hospital Stay: Attending: Oncology

## 2024-10-14 VITALS — BP 109/91 | HR 87 | Temp 98.2°F | Resp 16 | Wt 144.3 lb

## 2024-10-14 DIAGNOSIS — C187 Malignant neoplasm of sigmoid colon: Secondary | ICD-10-CM

## 2024-10-14 DIAGNOSIS — Z85038 Personal history of other malignant neoplasm of large intestine: Secondary | ICD-10-CM | POA: Insufficient documentation

## 2024-10-14 DIAGNOSIS — Z95828 Presence of other vascular implants and grafts: Secondary | ICD-10-CM

## 2024-10-14 MED ORDER — SODIUM CHLORIDE (PF) 0.9 % IJ SOLN
Freq: Once | INTRAMUSCULAR | Status: AC
  Filled 2024-10-14: qty 5

## 2024-10-14 MED ORDER — SODIUM CHLORIDE 0.9% FLUSH: 10.0000 mL | INTRAVENOUS | Status: DC | PRN

## 2024-10-14 NOTE — Progress Notes (Signed)
 Accessed Medtronic Pump LUQ abdomen using sterile technique without difficulty. Aspirated 5.2 ml (Ipad noted 5.0 ml). Flushed with 20 ml NS with 25,000 units heparin  aspirating every 3 ml. Needle removed and bandaid applied. Patient will return on 11/11/2024 for her next refill, Ipad recommends refilling pump by 11/12/2024. Patient tolerated procedure well.

## 2024-10-15 ENCOUNTER — Inpatient Hospital Stay

## 2024-11-10 ENCOUNTER — Telehealth: Payer: Self-pay

## 2024-11-10 NOTE — Telephone Encounter (Signed)
 Spoke with the patient regarding moving her appointment time on Tuesday, 11/11/2024, from 08:30 to 12:00. The patient was agreeable to this change.

## 2024-11-11 ENCOUNTER — Inpatient Hospital Stay: Attending: Oncology

## 2024-11-11 VITALS — BP 132/85 | HR 94 | Temp 98.3°F | Resp 18 | Ht 68.0 in | Wt 144.4 lb

## 2024-11-11 DIAGNOSIS — C187 Malignant neoplasm of sigmoid colon: Secondary | ICD-10-CM

## 2024-11-11 DIAGNOSIS — Z95828 Presence of other vascular implants and grafts: Secondary | ICD-10-CM

## 2024-11-11 DIAGNOSIS — Z85038 Personal history of other malignant neoplasm of large intestine: Secondary | ICD-10-CM | POA: Diagnosis present

## 2024-11-11 MED ORDER — SODIUM CHLORIDE 0.9% FLUSH: 10.0000 mL | INTRAVENOUS | Status: DC | PRN

## 2024-11-11 MED ORDER — SODIUM CHLORIDE (PF) 0.9 % IJ SOLN
Freq: Once | INTRAMUSCULAR | Status: AC
  Filled 2024-11-11: qty 5

## 2024-11-11 NOTE — Patient Instructions (Signed)
 Heparin  Injection What is this medication? HEPARIN  INJECTION (HEP a rin) prevents and treats blood clots. It belongs to a group of medications called blood thinners. This medicine may be used for other purposes; ask your health care provider or pharmacist if you have questions. COMMON BRAND NAME(S): Hep-Lock, Hep-Lock U/P, Hepflush-10, Monoject Prefill Advanced Heparin  Lock Flush, SASH Normal Saline and Heparin  What should I tell my care team before I take this medication? They need to know if you have any of these conditions: Bleeding disorders, such as hemophilia or low blood platelets Bowel disease or diverticulitis Endocarditis High blood pressure Liver disease Recent surgery Stomach ulcers An unusual or allergic reaction to heparin , other medications, foods, dyes, or preservatives Pregnant, trying to get pregnant, or recently pregnant Breastfeeding How should I use this medication? This medication is injected into a vein or under the skin. It is usually given by your care team in a hospital or clinic setting. If you get this medication at home, you will be taught how to prepare and give it. For your therapy to work as well as possible, take each dose exactly as prescribed on the prescription label. Do not skip doses. Skipping doses or stopping this medication can increase your risk of a blood clot. Keep taking this medication unless your care team tells you to stop. It is important that you put your used needles and syringes in a special sharps container. Do not put them in a trash can. If you do not have a sharps container, call your pharmacist or care team to get one. Talk to your care team about the use of this medication in children. While this medication may be prescribed for children for selected conditions, precautions do apply. Overdosage: If you think you have taken too much of this medicine contact a poison control center or emergency room at once. NOTE: This medicine is only  for you. Do not share this medicine with others. What if I miss a dose? If you miss a dose, take it as soon as you can. If it is almost time for your next dose, take only that dose. Do not take double or extra doses. What may interact with this medication? Do not take this medication with any of the following: Defibrotide Mifepristone Oritavancin Protamine Telavancin This medication may also interact with the following: Aspirin and aspirin-like medications NSAIDs, medications for pain and inflammation, such as ibuprofen or naproxen Other medications that treat or prevent blood clots, such as warfarin or enoxaparin This list may not describe all possible interactions. Give your health care provider a list of all the medicines, herbs, non-prescription drugs, or dietary supplements you use. Also tell them if you smoke, drink alcohol, or use illegal drugs. Some items may interact with your medicine. What should I watch for while using this medication? Your condition will be monitored carefully while you are receiving this medication. Tell your care team if your symptoms do not start to get better or if they get worse. You may need blood work done while you are taking this medication. In rare cases, the body's immune system may cause platelets to clump together in response to heparin . This causes a blood clot. It can happen weeks after stopping heparin . Talk to your care team right away if you have pain, swelling, or warmth in the leg, shortness of breath, or chest pain. Avoid sports and activities that may cause injury while you are taking this medication. Severe falls or injuries can cause unseen bleeding. Be  careful when using sharp tools or knives. Consider using an Neurosurgeon. Take special care brushing or flossing your teeth. Report any injuries, bruising, or red spots on the skin to your care team. If you are going to need surgery or a procedure, tell your care team that you are taking this  medication. Tell your dentist and dental surgeon that you are taking this medication. Wear a medical ID bracelet or chain. Carry a card that describes your condition. List the medications and doses you take on the card. What side effects may I notice from receiving this medication? Side effects that you should report to your care team as soon as possible: Allergic reactions--skin rash, itching, hives, swelling of the face, lips, tongue, or throat Bleeding--bloody or black, tar-like stools, vomiting blood or brown material that looks like coffee grounds, red or dark brown urine, small red or purple spots on skin, unusual bruising or bleeding Blood clot--pain, swelling, or warmth in the leg, shortness of breath, chest pain High potassium level--muscle weakness, fast or irregular heartbeat Side effects that usually do not require medical attention (report these to your care team if they continue or are bothersome): Pain, redness, or irritation at injection site This list may not describe all possible side effects. Call your doctor for medical advice about side effects. You may report side effects to FDA at 1-800-FDA-1088. Where should I keep my medication? Keep out of the reach of children and pets. Store at room temperature between 20 and 25 degrees C (68 and 77 degrees F). Do not freeze. Get rid of any unused medication after the expiration date. To get rid of medications that are no longer needed or have expired: Take the medication to a take-back program. Check with your pharmacy or law enforcement to find a location. If you cannot return the medication, ask your pharmacist or care team how to get rid of it safely. NOTE: This sheet is a summary. It may not cover all possible information. If you have questions about this medicine, talk to your doctor, pharmacist, or health care provider.  2025 Elsevier/Gold Standard (2023-12-06 00:00:00)

## 2024-11-11 NOTE — Progress Notes (Signed)
" °  Accessed Medtronic Pump LUQ abdomen using sterile technique without difficulty. Aspirated 5.0 ml (Ipad noted 3.1 ml). Flushed with 20 ml NS with 25,000 units heparin  aspirating every 3 ml. Needle removed and bandaid applied. Patient will return on 12/09/2024 for her next refill, Ipad recommends refilling pump by 12/10/2024. Patient tolerated procedure well.           "

## 2024-11-19 ENCOUNTER — Encounter: Payer: Self-pay | Admitting: Family Medicine

## 2024-11-19 ENCOUNTER — Ambulatory Visit: Admitting: Family Medicine

## 2024-11-19 VITALS — BP 132/86 | HR 81 | Temp 97.5°F | Ht 68.0 in | Wt 143.8 lb

## 2024-11-19 DIAGNOSIS — Z85038 Personal history of other malignant neoplasm of large intestine: Secondary | ICD-10-CM | POA: Diagnosis not present

## 2024-11-19 DIAGNOSIS — F902 Attention-deficit hyperactivity disorder, combined type: Secondary | ICD-10-CM | POA: Diagnosis not present

## 2024-11-19 DIAGNOSIS — Z23 Encounter for immunization: Secondary | ICD-10-CM | POA: Diagnosis not present

## 2024-11-19 MED ORDER — AMPHETAMINE-DEXTROAMPHETAMINE 10 MG PO TABS: 10.0000 mg | ORAL_TABLET | Freq: Two times a day (BID) | ORAL | 0 refills | Status: DC

## 2024-11-19 NOTE — Progress Notes (Signed)
 "  New Patient Office Visit  Subjective:  Patient ID: Patricia Hudson, female    DOB: 1966/04/03  Age: 58 y.o. MRN: [id number]  CC:  Chief Complaint  Patient presents with   New Patient (Initial Visit)    New patient, never had a PCP. Is behind on PAP. Has Colon Cancer but is being followed by Dr. KATHEE Hof. Has fasted for labs if needed.     Discussed the use of AI scribe software for clinical note transcription with the patient, who gave verbal consent to proceed.  History of Present Illness Patricia Hudson is a 58 year old female with stage four colon cancer who presents to establish care as a new patient.  She has a history of stage four colon cancer, diagnosed at age 54, with a single liver metastasis. She underwent treatment at [facility], including an intrahepatic infusion device, and has been stable since. She currently has a port-a-cath and a mediport device, which are monitored and may be removed after a final clear CT scan in March. Had recurrance, but now in remission  Has not had cholesterol checked since past 8 yrs, dealing w/cancer. Her basic metabolic panels have been normal in the past.  She reports difficulty being present and patient, particularly with her mother who has Alzheimer's, and describes herself as 'very high functioning' but is interested in exploring treatment options to improve her focus and patience. Has had ADD whole life, but able to function and be productive d/t being type A  Husband PCP w/[facility] and agrees w/ADD.  Reason for wanting tx now is to be able to better focus on Mom and not distracted.  Brain always going in mult directions but does get tasks done.  Hyper.  Does lose things at times. Drinks caffeine all day long which helps w/focus  Her past medical history includes a kidney stone that passed spontaneously and [place]. She has also had a tonsillectomy and partial colectomy.  Her family history includes diabetes  and high blood pressure in her deceased father, Alzheimer's and breast cancer in her mother, and breast cancer in her aunt. She has been evaluated for high risk of breast cancer, with recommendations for regular mammograms and MRIs.  She is married with one child, does not use recreational drugs or smoke, and drinks alcohol occasionally, about one to two glasses of wine per year. 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  No major headaches, dizziness, syncope, chest pain, dyspnea, abdominal pain, vomiting, or diarrhea. She reports being 'a hundred percent perfectly healthy' aside from her past medical history.    Current Medications[1]  Past Medical History:  Diagnosis Date   Cancer (HCC) colon cancer 2017   liver mass   Closed displaced fracture of fifth metatarsal bone of right foot 06/28/2017   Fracture of fourth metatarsal bone 06/28/2017   Fracture of third metatarsal bone 06/28/2017   Renal calculi 1994    Past Surgical History:  Procedure Laterality Date   COLONOSCOPY     COLONOSCOPY N/A 07/14/2016   Procedure: COLONOSCOPY;  Surgeon: Belvie Just, MD;  Location: [facility];  Service: Endoscopy;  Laterality: N/A;   LAPAROSCOPIC LIVER CYST REMOVAL N/A 07/27/2016   Procedure: REMOVAL OF LIVER MASS;  Surgeon: Krystal Russell, MD;  Location: [facility] ORS;  Service: General;  Laterality: N/A;   LAPAROSCOPIC PARTIAL COLECTOMY N/A 07/27/2016   Procedure: LAPAROSCOPIC PARTIAL COLECTOMY MOBILIZATION OF SPLENIC FLEXURE LAPAROSCOPIC SEGMENTAL LIVER  RESECTION OF SEGMENT 8 OF LIVER;  Surgeon: Krystal Russell, MD;  Location: [facility]S;  Service: General;  Laterality: N/A;   PERCUTANEOUS PINNING Right 06/28/2017   Procedure: RIGHT FOOT 3RD, 4TH, 5TH METATARSAL (TOE) FRACTURE;  Surgeon: Josefina Chew, MD;  Location: [facility];  Service: Orthopedics;  Laterality: Right;   PORT-A-CATH REMOVAL N/A 08/14/2018   Procedure: REMOVAL PORT-A-CATH ERAS PATHWAY;  Surgeon: Aron Shoulders,  MD;  Location: [facility];  Service: General;  Laterality: N/A;   PORTACATH PLACEMENT Right 08/17/2016   Procedure: ULTRASOUND GUIDED INSERTION PORT-A-CATH RIGHT INTERNAL JUGULAR;  Surgeon: Krystal Russell, MD;  Location: [facility] OR;  Service: General;  Laterality: Right;   TONSILLECTOMY AND ADENOIDECTOMY  1970's    Family History  Problem Relation Age of Onset   Breast cancer Mother 47       triple negative.  BRCA Neg   Colon polyps Mother        unspecified number   Alzheimer's disease Mother    Diabetes Father    Hypertension Father    Lung cancer Father 13       smoker   Colon polyps Father        unspecified number   Colon polyps Brother        unspecified number   Pancreatic cancer Maternal Uncle 26       d. 65y   Breast cancer Paternal Aunt 2       carcinoma in situ   Breast cancer Maternal Grandmother        dx late 58s; s/p mastectomy; d. 82y   Other Maternal Grandfather        hx of half of stomach removed - not aware of cancer   Parkinson's disease Paternal Grandfather    Heart attack Paternal Grandfather        d. 73y   Leukemia Other        maternal great uncle (MGM's brother)   Cancer Other        paternal great aunt (PGM's sister) d. 47s, abdominal/Gyn cancer; lim info    Social History   Socioeconomic History   Marital status: Married    Spouse name: Not on file   Number of children: 1   Years of education: Not on file   Highest education level: Not on file  Occupational History   Occupation: attorney  Tobacco Use   Smoking status: Never   Smokeless tobacco: Never  Vaping Use   Vaping status: Never Used  Substance and Sexual Activity   Alcohol use: Yes    Comment: 1 a month   Drug use: No   Sexual activity: Yes    Partners: Male    Birth control/protection: Post-menopausal  Other Topics Concern   Not on file  Social History Narrative   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  Husband is a family MD   One child    Social Drivers of Health   Tobacco Use: Low Risk (11/19/2024)   Patient History    Smoking Tobacco Use: Never    Smokeless Tobacco Use: Never    Passive Exposure: Not on file  Financial Resource Strain: Not on file  Food Insecurity: Not on file  Transportation Needs: Not on file  Physical Activity: Not on file  Stress: Not on file  Social Connections: Not on file  Intimate Partner Violence: Not on file  Depression (PHQ2-9): Low Risk (11/11/2024)   Depression (PHQ2-9)    PHQ-2  Score: 0  Alcohol Screen: Not on file  Housing: Not on file  Utilities: Not on file  Health Literacy: Not on file    ROS  ROS: Gen: no fever, chills  Skin: no rash, itching ENT: no ear pain, ear drainage, nasal congestion, rhinorrhea, sinus pressure, sore throat Eyes: no blurry vision, double vision Resp: no cough, wheeze,SOB CV: no CP, palpitations, LE edema,  GI: no heartburn, n/v/d/c, abd pain GU: no dysuria, urgency, frequency, hematuria MSK: no joint pain, myalgias, back pain Neuro: no dizziness, headache, weakness, vertigo Psych: no depression, anxiety, insomnia, SI   Objective:   Today's Vitals: BP 132/86 (BP Location: Left Arm, Patient Position: Sitting, Cuff Size: Normal)   Pulse 81   Temp (!) 97.5 F (36.4 C) (Temporal)   Ht 5' 8 (1.727 m)   Wt 143 lb 12.8 oz (65.2 kg)   LMP 04/20/2013   SpO2 99%   BMI 21.86 kg/m   Physical Exam  Gen: WDWN NAD HEENT: NCAT, conjunctiva not injected, sclera nonicteric  NECK:  supple, no thyromegaly, no nodes, no carotid bruits CARDIAC: RRR, S1S2+, no murmur LUNGS: CTAB. No wheezes ABDOMEN:  BS+, soft, NTND, No HSM, no masses EXT:  no edema MSK: no gross abnormalities.  NEURO: A&O x3.  CN II-XII intact.  PSYCH: normal mood. Good eye contact  Hyper  Pdmp checked  Assessment & Plan:  ADHD (attention deficit hyperactivity disorder), combined type  History of malignant neoplasm of colon  Immunization due -     Flu vaccine trivalent  PF, 6mos and older(Flulaval,Afluria,Fluarix,Fluzone)  Other orders -     Amphetamine-Dextroamphetamine; Take 1 tablet (10 mg total) by mouth 2 (two) times daily with a meal.  Dispense: 60 tablet; Refill: 0    Assessment and Plan Assessment & Plan Attention-deficit hyperactivity disorder, predominantly mixed type   She has long-standing ADHD, predominantly mixed type, with high functionality in her professional life. She seeks increased calmness and presence, particularly when with her mother who has Alzheimer's. She has no history of medication use for ADHD. Her husband, a family physician, recommends Adderall. Potential side effects of Adderall, such as heart palpitations, shakiness, and jitteriness, were discussed. She was advised to reduce caffeine intake when taking Adderall to avoid exacerbating side effects. She will start with a low dose of Adderall 10mg  bid, with plans to adjust as needed. A follow-up will be scheduled to assess her response to the medication and adjust the dosage if necessary.  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 She is awaiting a final CT scan in March to determine if the port-a-cath and mediport device can be removed.  General Health Maintenance   She is up to date on mammograms and recently received a flu vaccine. She plans to receive COVID and Shingrix vaccines. Her last Pap smear was in 2017 with no history of abnormal results. She reports no current medications or significant health issues. She will continue routine health maintenance screenings as scheduled and plans for an annual physical examination, including a Pap smear.      Follow-up: Return for annual physical and pap.   Jenkins CHRISTELLA Carrel, MD     [1]  Current Outpatient Medications:     amphetamine-dextroamphetamine (ADDERALL) 10 MG tablet, Take 1 tablet (10 mg total) by mouth 2 (two) times daily with a meal.,  Disp: 60 tablet, Rfl: 0   lidocaine -prilocaine  (EMLA ) cream, APPLY TOPICALLY ONCE FOR 1 DOSE APPLY TO PORT SITE ONE PRIOR TO BEING ACCESSED, Disp: , Rfl:   "

## 2024-11-19 NOTE — Patient Instructions (Signed)
Welcome to [facility]! It was a pleasure meeting you today. ? ?As discussed, Please schedule a 2 month follow up visit today. ? ?PLEASE NOTE: ? ?If you had any LAB tests please let us know if you have not heard back within a few days. You may see your results on MyChart before we have a chance to review them but we will give you a call once they are reviewed by [place]. If we ordered any REFERRALS today, please let us know if you have not heard from their office within the next week.  ?Let us know through MyChart if you are needing REFILLS, or have your pharmacy send [place] the request. You can also use MyChart to communicate with me or any office staff. ? ?Please try these tips to maintain a healthy lifestyle: ? ?Eat most of your calories during the day when you are active. Eliminate processed foods including packaged sweets (pies, cakes, cookies), reduce intake of potatoes, white bread, white pasta, and white rice. Look for whole grain options, oat flour or almond flour. ? ?Each meal should contain half fruits/vegetables, one quarter protein, and one quarter carbs (no bigger than a computer mouse). ? ?Cut down on sweet beverages. This includes juice, soda, and sweet tea. Also watch fruit intake, though this is a healthier sweet option, it still contains natural sugar! Limit to 3 servings daily. ? ?Drink at least 1 glass of water with each meal and aim for at least 8 glasses per day ? ?Exercise at least 150 minutes every week.   ?

## 2024-12-09 ENCOUNTER — Inpatient Hospital Stay: Attending: Oncology

## 2024-12-09 VITALS — BP 129/86 | HR 100 | Temp 98.1°F | Resp 16 | Ht 68.0 in | Wt 135.6 lb

## 2024-12-09 DIAGNOSIS — C187 Malignant neoplasm of sigmoid colon: Secondary | ICD-10-CM

## 2024-12-09 DIAGNOSIS — Z95828 Presence of other vascular implants and grafts: Secondary | ICD-10-CM

## 2024-12-09 MED ORDER — SODIUM CHLORIDE (PF) 0.9 % IJ SOLN
Freq: Once | INTRAMUSCULAR | Status: AC
  Filled 2024-12-09: qty 5

## 2024-12-09 MED ORDER — SODIUM CHLORIDE 0.9% FLUSH
10.0000 mL | INTRAVENOUS | Status: DC | PRN
  Administered 2024-12-09: 10 mL via INTRAVENOUS

## 2024-12-09 NOTE — Progress Notes (Signed)
 Accessed Medtronic Pump LUQ abdomen using sterile technique without difficulty. Aspirated 5.0 ml (Ipad noted 3.3 ml). Flushed with 20 ml NS with 25,000 units heparin  aspirating every 3 ml. Needle removed and bandaid applied. Patient will return on 01/05/2025 for her next refill, Ipad recommends refilling pump by 01/07/2025. Patient tolerated procedure well.

## 2024-12-09 NOTE — Patient Instructions (Signed)
 Heparin  Injection What is this medication? HEPARIN  INJECTION (HEP a rin) prevents and treats blood clots. It belongs to a group of medications called blood thinners. This medicine may be used for other purposes; ask your health care provider or pharmacist if you have questions. COMMON BRAND NAME(S): Hep-Lock, Hep-Lock U/P, Hepflush-10, Monoject Prefill Advanced Heparin  Lock Flush, SASH Normal Saline and Heparin  What should I tell my care team before I take this medication? They need to know if you have any of these conditions: Bleeding disorders, such as hemophilia or low blood platelets Bowel disease or diverticulitis Endocarditis High blood pressure Liver disease Recent surgery Stomach ulcers An unusual or allergic reaction to heparin , other medications, foods, dyes, or preservatives Pregnant, trying to get pregnant, or recently pregnant Breastfeeding How should I use this medication? This medication is injected into a vein or under the skin. It is usually given by your care team in a hospital or clinic setting. If you get this medication at home, you will be taught how to prepare and give it. For your therapy to work as well as possible, take each dose exactly as prescribed on the prescription label. Do not skip doses. Skipping doses or stopping this medication can increase your risk of a blood clot. Keep taking this medication unless your care team tells you to stop. It is important that you put your used needles and syringes in a special sharps container. Do not put them in a trash can. If you do not have a sharps container, call your pharmacist or care team to get one. Talk to your care team about the use of this medication in children. While this medication may be prescribed for children for selected conditions, precautions do apply. Overdosage: If you think you have taken too much of this medicine contact a poison control center or emergency room at once. NOTE: This medicine is only  for you. Do not share this medicine with others. What if I miss a dose? If you miss a dose, take it as soon as you can. If it is almost time for your next dose, take only that dose. Do not take double or extra doses. What may interact with this medication? Do not take this medication with any of the following: Defibrotide Mifepristone Oritavancin Protamine Telavancin This medication may also interact with the following: Aspirin and aspirin-like medications NSAIDs, medications for pain and inflammation, such as ibuprofen or naproxen Other medications that treat or prevent blood clots, such as warfarin or enoxaparin This list may not describe all possible interactions. Give your health care provider a list of all the medicines, herbs, non-prescription drugs, or dietary supplements you use. Also tell them if you smoke, drink alcohol, or use illegal drugs. Some items may interact with your medicine. What should I watch for while using this medication? Your condition will be monitored carefully while you are receiving this medication. Tell your care team if your symptoms do not start to get better or if they get worse. You may need blood work done while you are taking this medication. In rare cases, the body's immune system may cause platelets to clump together in response to heparin . This causes a blood clot. It can happen weeks after stopping heparin . Talk to your care team right away if you have pain, swelling, or warmth in the leg, shortness of breath, or chest pain. Avoid sports and activities that may cause injury while you are taking this medication. Severe falls or injuries can cause unseen bleeding. Be  careful when using sharp tools or knives. Consider using an Neurosurgeon. Take special care brushing or flossing your teeth. Report any injuries, bruising, or red spots on the skin to your care team. If you are going to need surgery or a procedure, tell your care team that you are taking this  medication. Tell your dentist and dental surgeon that you are taking this medication. Wear a medical ID bracelet or chain. Carry a card that describes your condition. List the medications and doses you take on the card. What side effects may I notice from receiving this medication? Side effects that you should report to your care team as soon as possible: Allergic reactions--skin rash, itching, hives, swelling of the face, lips, tongue, or throat Bleeding--bloody or black, tar-like stools, vomiting blood or brown material that looks like coffee grounds, red or dark brown urine, small red or purple spots on skin, unusual bruising or bleeding Blood clot--pain, swelling, or warmth in the leg, shortness of breath, chest pain High potassium level--muscle weakness, fast or irregular heartbeat Side effects that usually do not require medical attention (report these to your care team if they continue or are bothersome): Pain, redness, or irritation at injection site This list may not describe all possible side effects. Call your doctor for medical advice about side effects. You may report side effects to FDA at 1-800-FDA-1088. Where should I keep my medication? Keep out of the reach of children and pets. Store at room temperature between 20 and 25 degrees C (68 and 77 degrees F). Do not freeze. Get rid of any unused medication after the expiration date. To get rid of medications that are no longer needed or have expired: Take the medication to a take-back program. Check with your pharmacy or law enforcement to find a location. If you cannot return the medication, ask your pharmacist or care team how to get rid of it safely. NOTE: This sheet is a summary. It may not cover all possible information. If you have questions about this medicine, talk to your doctor, pharmacist, or health care provider.  2025 Elsevier/Gold Standard (2023-12-06 00:00:00)

## 2024-12-19 ENCOUNTER — Encounter: Payer: Self-pay | Admitting: *Deleted

## 2024-12-26 ENCOUNTER — Encounter: Payer: Self-pay | Admitting: Family Medicine

## 2024-12-26 ENCOUNTER — Other Ambulatory Visit: Payer: Self-pay | Admitting: Family Medicine

## 2024-12-26 MED ORDER — AMPHETAMINE-DEXTROAMPHETAMINE 10 MG PO TABS: 10.0000 mg | ORAL_TABLET | Freq: Two times a day (BID) | ORAL | 0 refills | Status: AC

## 2025-01-05 ENCOUNTER — Inpatient Hospital Stay

## 2025-01-19 ENCOUNTER — Other Ambulatory Visit

## 2025-01-19 ENCOUNTER — Other Ambulatory Visit (HOSPITAL_BASED_OUTPATIENT_CLINIC_OR_DEPARTMENT_OTHER)

## 2025-01-20 ENCOUNTER — Ambulatory Visit: Admitting: Family Medicine

## 2025-01-21 ENCOUNTER — Other Ambulatory Visit: Admitting: Oncology

## 2025-02-11 ENCOUNTER — Ambulatory Visit: Admitting: Family Medicine

## 2025-02-18 ENCOUNTER — Encounter: Admitting: Family Medicine
